# Patient Record
Sex: Female | Born: 1960
Health system: Southern US, Community
[De-identification: ages and names within clinical notes are randomized; demographics above are authoritative.]

## PROBLEM LIST (undated history)

## (undated) DIAGNOSIS — E119 Type 2 diabetes mellitus without complications: Secondary | ICD-10-CM

## (undated) DIAGNOSIS — T8859XA Other complications of anesthesia, initial encounter: Secondary | ICD-10-CM

## (undated) DIAGNOSIS — G709 Myoneural disorder, unspecified: Secondary | ICD-10-CM

## (undated) DIAGNOSIS — T4145XA Adverse effect of unspecified anesthetic, initial encounter: Secondary | ICD-10-CM

## (undated) DIAGNOSIS — I890 Lymphedema, not elsewhere classified: Secondary | ICD-10-CM

## (undated) HISTORY — PX: JOINT REPLACEMENT: SHX530

## (undated) HISTORY — DX: Type 2 diabetes mellitus without complications: E11.9

## (undated) HISTORY — PX: CATARACT EXTRACTION, BILATERAL: SHX1313

## (undated) HISTORY — PX: TUBAL LIGATION: SHX77

---
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
## 2007-10-14 ENCOUNTER — Ambulatory Visit: Payer: Self-pay | Admitting: Anesthesiology

## 2007-11-18 ENCOUNTER — Ambulatory Visit: Payer: Self-pay | Admitting: Anesthesiology

## 2008-01-03 ENCOUNTER — Ambulatory Visit: Payer: Self-pay | Admitting: Anesthesiology

## 2014-03-20 ENCOUNTER — Ambulatory Visit: Payer: Self-pay | Admitting: Internal Medicine

## 2014-03-20 IMAGING — CR DG THORACIC SPINE 2-3V
1 series · 2 of 2 positions shown · non-contrast
Comparison: Chest x-ray of today's date

CLINICAL DATA: One month of right-sided chest pain neck without
reported injury, initial visit

EXAM:
THORACIC SPINE - 2 VIEW

[Series 1: dxr thoracic  ap and lateral · 0.14mm/px · 2 of 2 slices shown]
[im 1/2]
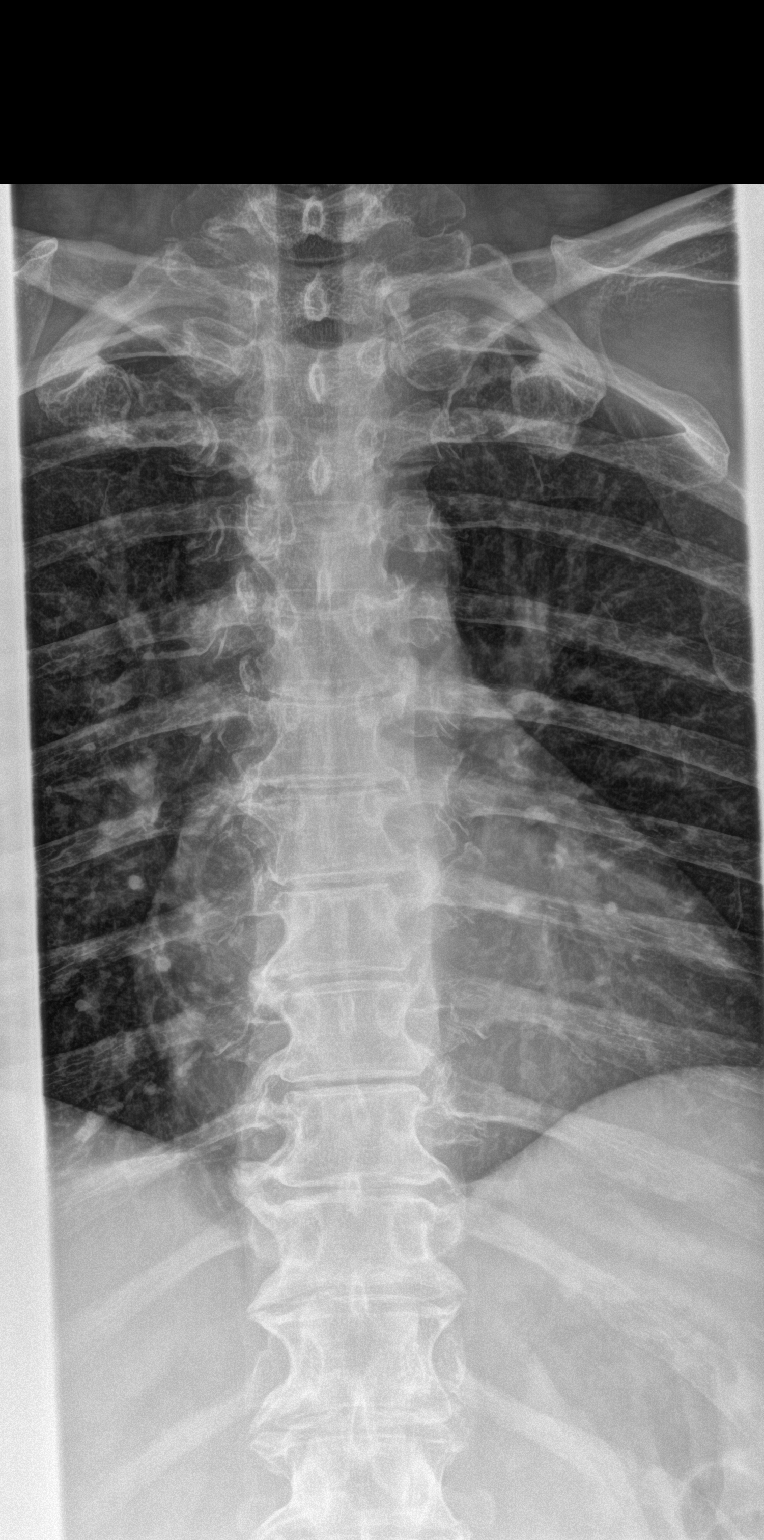
[im 2/2]
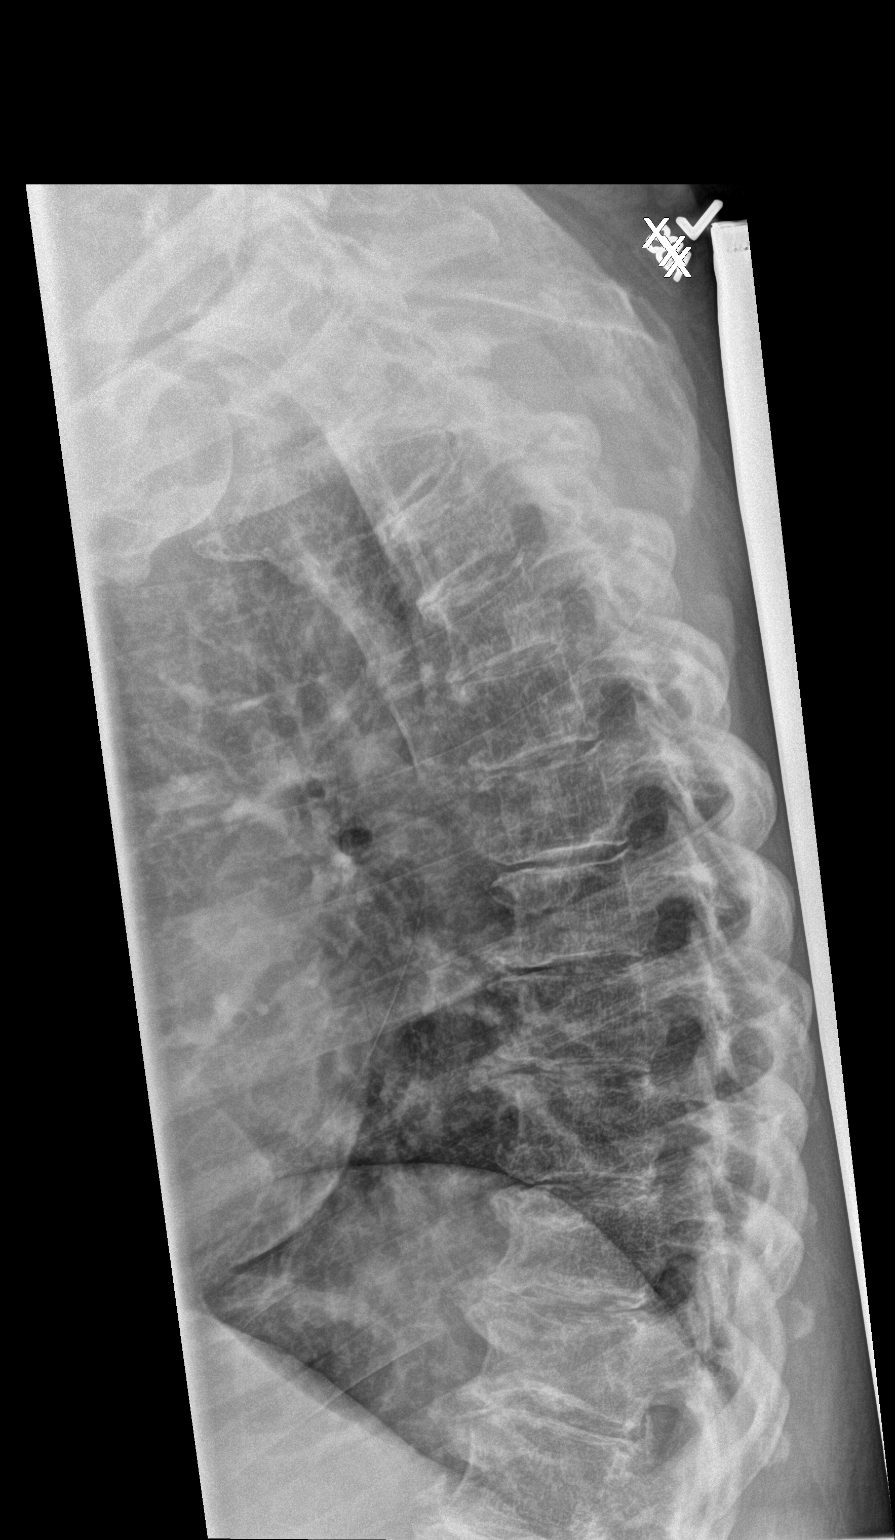

[2 of 2 positions shown; findings below may reference images not displayed]

FINDINGS: The thoracic vertebral bodies are preserved in height. There is mild
multilevel disc space narrowing. There are anterior and
predominantly right lateral osteophytes in the mid and lower
thoracic spine. The pedicles are intact. There are no abnormal
paravertebral soft tissue densities.
IMPRESSION: There is moderate degenerative disc disease of the mid and lower
thoracic spine. There is no compression fracture.

## 2014-03-20 IMAGING — CR DG CHEST 2V
1 series · 2 of 2 positions shown · non-contrast
Comparison: No priors.

CLINICAL DATA: 53-year-old female complaining of right-sided chest
pain for 1 month.

EXAM:
CHEST  2 VIEW

[Series 1: dxr chest pa (or ap) and lateral · 0.14mm/px · 2 of 2 slices shown]
[im 1/2]
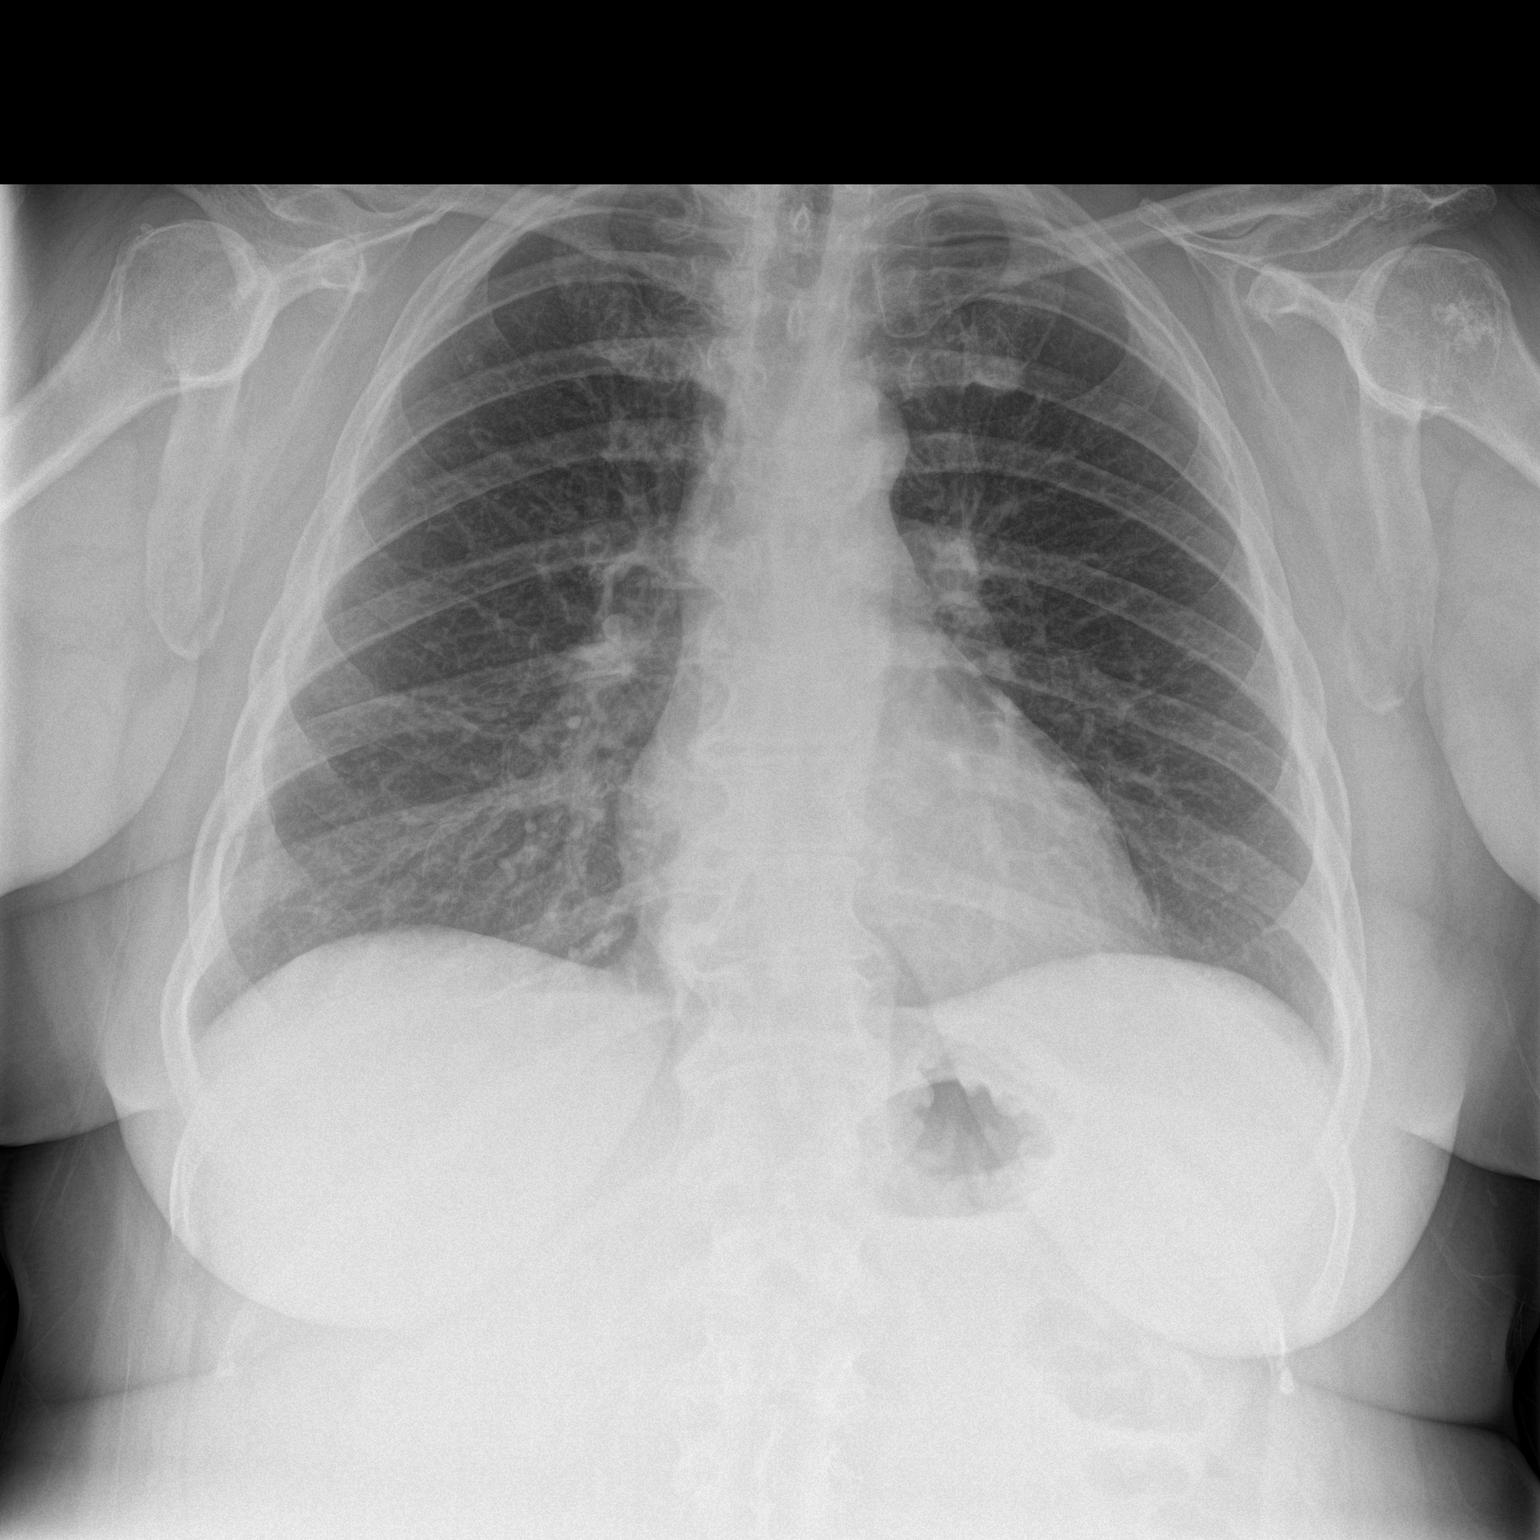
[im 2/2]
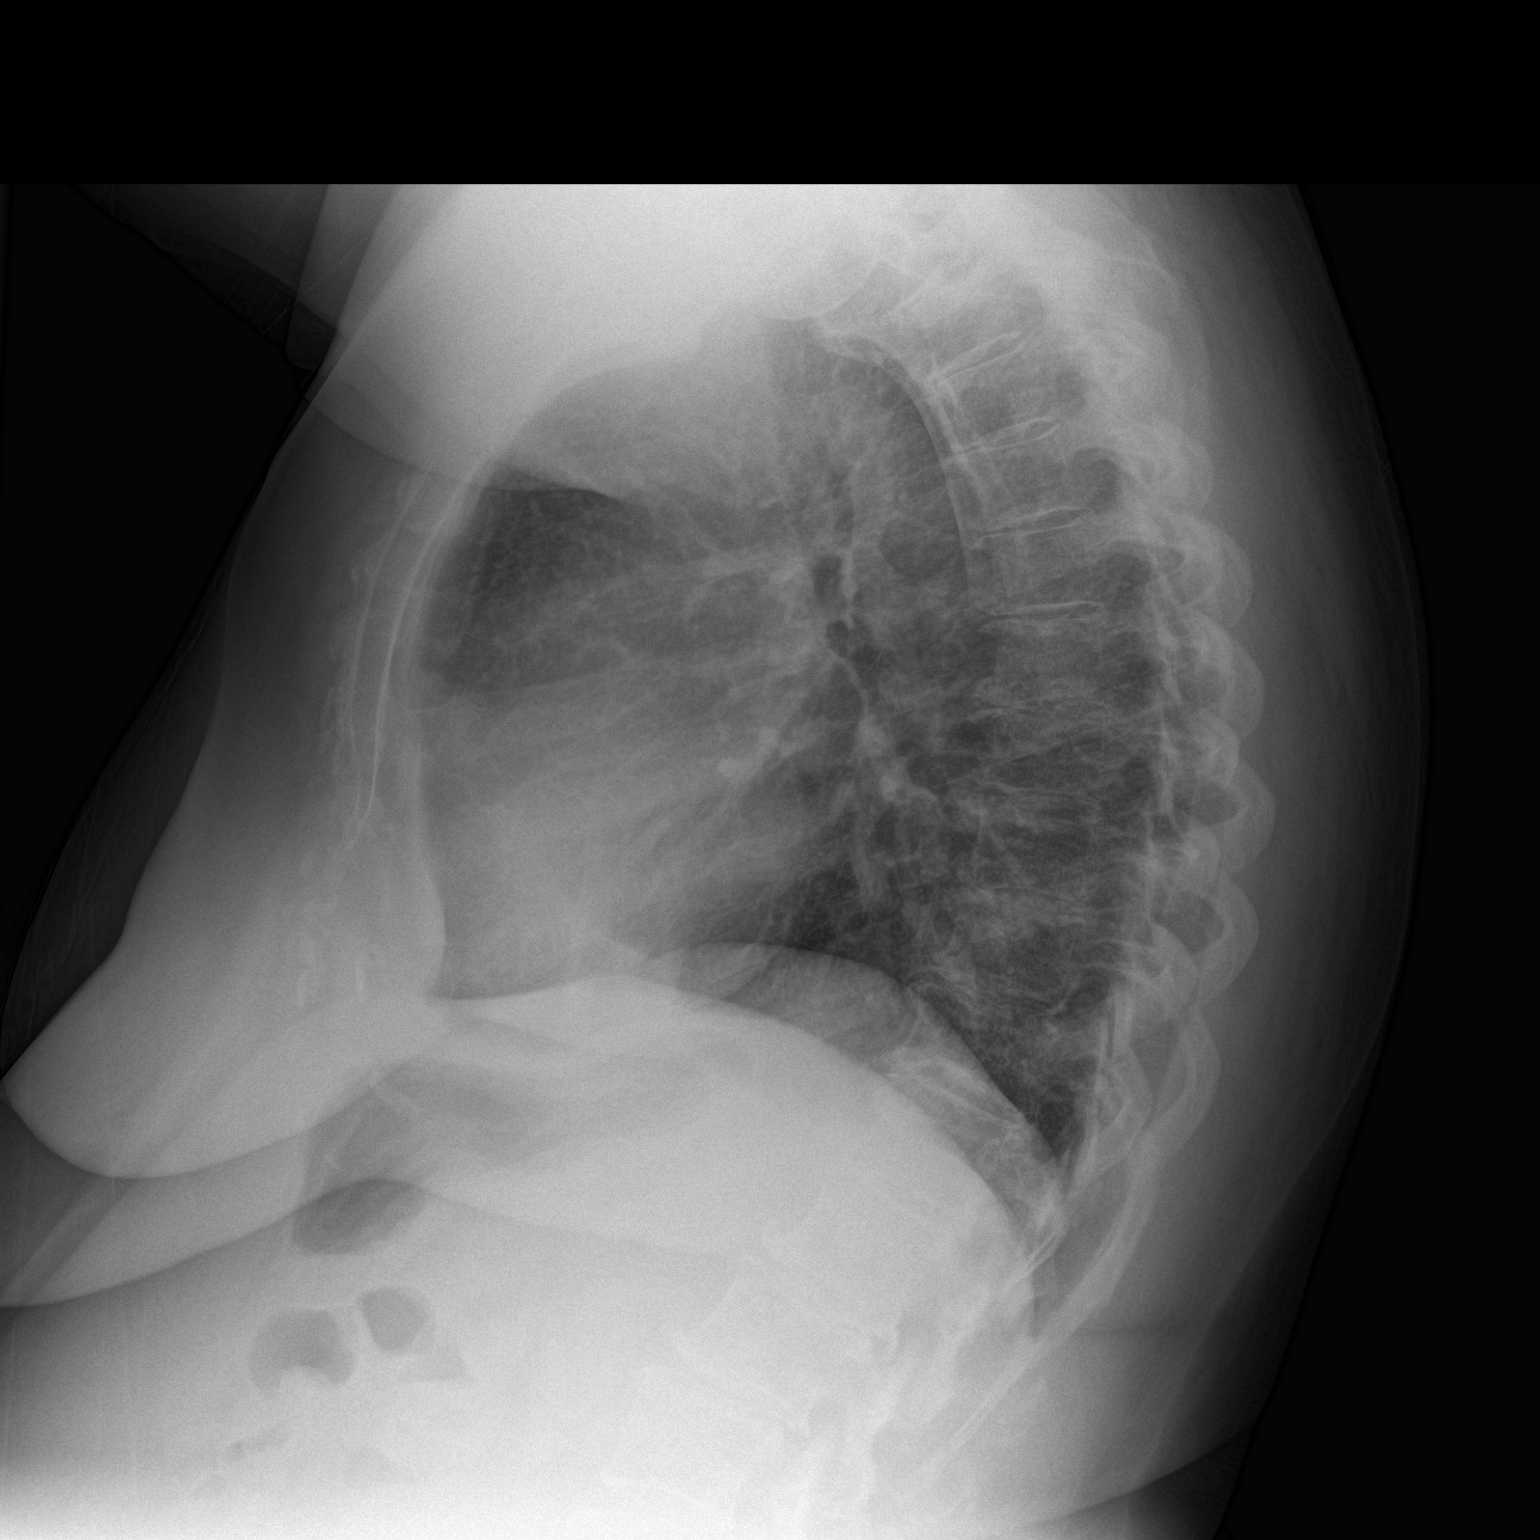

[2 of 2 positions shown; findings below may reference images not displayed]

FINDINGS: Lung volumes are normal. No consolidative airspace disease. No
pleural effusions. No pneumothorax. No pulmonary nodule or mass
noted. Pulmonary vasculature and the cardiomediastinal silhouette
are within normal limits. Atherosclerosis in the thoracic aorta.
Subtle irregularity of the lateral aspect of the right seventh rib
could represent a healed or healing fracture. Sclerotic lesion
measuring approximately 2 cm in diameter in the left humeral head.
IMPRESSION: 1. No radiographic evidence of acute cardiopulmonary disease.
2. Subtle irregularity of the lateral aspect of the right seventh
rib could represent an old healed fracture or a healing fracture. If
this correlates with point tenderness, dedicated right-sided rib
series would be recommended to evaluate for a healing fracture.
3. Atherosclerosis.
4. Sclerotic lesion in the left humeral head measuring approximately
2 cm in diameter, favored to represent an enchondroma. Dedicated
left shoulder radiographs are recommended to establish a baseline
evaluation for future followup of this lesion.

## 2014-07-02 ENCOUNTER — Encounter: Payer: Self-pay | Admitting: Podiatry

## 2014-07-02 ENCOUNTER — Ambulatory Visit (INDEPENDENT_AMBULATORY_CARE_PROVIDER_SITE_OTHER): Payer: BLUE CROSS/BLUE SHIELD | Admitting: Podiatry

## 2014-07-02 ENCOUNTER — Ambulatory Visit (INDEPENDENT_AMBULATORY_CARE_PROVIDER_SITE_OTHER): Payer: BLUE CROSS/BLUE SHIELD

## 2014-07-02 VITALS — BP 140/75 | HR 93 | Resp 17 | Ht 64.0 in | Wt 223.0 lb

## 2014-07-02 DIAGNOSIS — M79671 Pain in right foot: Secondary | ICD-10-CM

## 2014-07-02 DIAGNOSIS — M722 Plantar fascial fibromatosis: Secondary | ICD-10-CM

## 2014-07-02 MED ORDER — MELOXICAM 15 MG PO TABS: 15.0000 mg | ORAL_TABLET | Freq: Every day | ORAL | Status: DC

## 2014-07-02 MED ORDER — METHYLPREDNISOLONE 4 MG PO TBPK: ORAL_TABLET | ORAL | Status: DC

## 2014-07-02 NOTE — Progress Notes (Signed)
   Subjective:    Patient ID: Courtney Henderson, female    DOB: 1960-02-28, 54 y.o.   MRN: [id number] Pt comes in today with both heels hurting 6 sixs weeks , she soaking  her her feet, iced, changed her insoles, tried elevation and now she is here.  By the end of the day her her heels are swollen and hurting , to where it hurts to walk. Pt is diabetic  Last A1c was 9-10 months ago , 150 is her avg.  HPI    Review of Systems  Constitutional: Positive for fatigue and unexpected weight change.       Night sweats  W/ explantion   HENT:       Glaucoma Major tooth loss   Respiratory:       Smoker   Cardiovascular:       Poor circulation  Numbness Stiffness, loss of feeling   Endocrine:       Diabetic   Musculoskeletal:       Muscle pain or cramps  sciatica issues now   All other systems reviewed and are negative.      Objective:   Physical Exam: I have reviewed her past medical history medications allergy surgery social history and review of systems. Pulses are strongly palpable bilateral. Neurologic sensorium is intact per Semmes-Weinstein monofilament. Deep tendon reflexes intact bilateral and muscle strength +5 over 5 dorsiflexion plantar flexors and inverters and everters all of his musculature is intact. Orthopedic evaluation of his face pain on palpation medial calcaneal tubercles bilateral. Radiographs do demonstrate soft tissue increase in density plantar fascial calcaneal insertion sites bilateral with with heel spurs present.        Assessment & Plan:  Assessment: Plantar fasciitis bilateral.  Plan: Start her on a Medrol Dosepak encouraged her to watch her sugars very closely. I also started her on meloxicam after the Medrol. I injected the bilateral heels today. Her plantar fascial braces and a night splint. We discussed appropriate shoe gear stretching exercises ice therapy. We discussed the etiology pathology conservative versus surgical therapies. I'll follow-up with her  in 1 month

## 2014-08-08 ENCOUNTER — Ambulatory Visit: Payer: Self-pay | Admitting: Podiatry

## 2014-08-22 ENCOUNTER — Ambulatory Visit (INDEPENDENT_AMBULATORY_CARE_PROVIDER_SITE_OTHER): Payer: BLUE CROSS/BLUE SHIELD | Admitting: Podiatry

## 2014-08-22 DIAGNOSIS — M722 Plantar fascial fibromatosis: Secondary | ICD-10-CM

## 2014-08-22 NOTE — Progress Notes (Signed)
She presents today and states that her heels are doing much better. She states that she is approximately 90% improved. She states that she continues to smoke on a regular basis and has not decreased at all she also states that she does not wear her plantar fascial braces or night splint. She is not taking any medications.  Objective: Vital signs are stable she is alert and oriented 3 she has pain on palpation medial calcaneal tubercle bilateral. Pulses remain palpable and pain.  Assessment: Plantar fasciitis bilateral.  Plan: Injected the plantar bilateral fascia today with Kenalog and local and aesthetic. Encouraged CONSERVATIVE therapies and will follow up with her in 1 month at which time we may need to consider orthotics.

## 2014-09-19 ENCOUNTER — Ambulatory Visit: Payer: BLUE CROSS/BLUE SHIELD | Admitting: Podiatry

## 2014-10-08 ENCOUNTER — Encounter: Payer: Self-pay | Admitting: Podiatry

## 2014-10-08 ENCOUNTER — Ambulatory Visit (INDEPENDENT_AMBULATORY_CARE_PROVIDER_SITE_OTHER): Payer: BLUE CROSS/BLUE SHIELD | Admitting: Podiatry

## 2014-10-08 VITALS — BP 140/80 | HR 69 | Resp 12

## 2014-10-08 DIAGNOSIS — M722 Plantar fascial fibromatosis: Secondary | ICD-10-CM

## 2014-10-08 MED ORDER — MELOXICAM 15 MG PO TABS: 15.0000 mg | ORAL_TABLET | Freq: Every day | ORAL | Status: DC

## 2014-10-08 NOTE — Progress Notes (Signed)
She presents today for follow-up of her plantar fasciitis. She states that it comes and goes and seems to get worse by the end of very month. She states that it feels like it is pulling or drawing. She continues her anti-inflammatory's.  Objective: Vital signs are stable she is alert and oriented 3. Pulses are palpable. Neurologic sensorium is intact per Semmes-Weinstein monofilament. Deep tendon reflexes are intact. Muscle strength equal bilateral. She is pain on palpation medial calcaneal tubercles bilateral.  Assessment: Chronic intractable plantar fasciitis biomechanical in nature bilateral.  Plan: Injected the bilateral heels today with Kenalog and local and aesthetic. Started her back on her meloxicam and reordered that. Also requested her to be scanned for set of orthotics. I will follow-up with her once this come in.

## 2014-11-07 ENCOUNTER — Encounter: Payer: Self-pay | Admitting: Podiatry

## 2014-11-07 ENCOUNTER — Ambulatory Visit (INDEPENDENT_AMBULATORY_CARE_PROVIDER_SITE_OTHER): Payer: BLUE CROSS/BLUE SHIELD | Admitting: Podiatry

## 2014-11-07 VITALS — BP 132/77 | HR 93 | Resp 18

## 2014-11-07 DIAGNOSIS — M722 Plantar fascial fibromatosis: Secondary | ICD-10-CM

## 2014-11-07 NOTE — Patient Instructions (Signed)

## 2014-11-07 NOTE — Progress Notes (Signed)
She presents today to pick up her orthotics for her plantar fasciitis. She was given both oral and written home-going instructions for care and use of the orthotics she will follow-up with me in 6 weeks.

## 2014-12-05 ENCOUNTER — Ambulatory Visit: Payer: BLUE CROSS/BLUE SHIELD | Admitting: Podiatry

## 2015-01-09 ENCOUNTER — Encounter: Payer: Self-pay | Admitting: Podiatry

## 2015-01-09 ENCOUNTER — Ambulatory Visit (INDEPENDENT_AMBULATORY_CARE_PROVIDER_SITE_OTHER): Payer: BLUE CROSS/BLUE SHIELD | Admitting: Podiatry

## 2015-01-09 VITALS — BP 169/91 | HR 92 | Resp 18

## 2015-01-09 DIAGNOSIS — M722 Plantar fascial fibromatosis: Secondary | ICD-10-CM

## 2015-01-09 NOTE — Progress Notes (Signed)
She presents today with chief complaint of bilateral heel pain. She would like to have her ingrown toenails cut out however we never received the paperwork from her primary provider with her latest A1c.  Objective: Vital signs are stable she is alert and oriented 3. Pulses are palpable. She has sharp incurvated nail margins to the tibiofibular border of the hallux bilateral. These are painful on palpation. She still has pain on palpation medial calcaneal tubercles bilateral. The nail margins do not appear to be infected.  Assessment: Chronic intractable plantar fasciitis bilateral.  Plan: Reinject the bilateral heels today with prolonged local anesthetic follow-up with her as needed.

## 2015-02-10 ENCOUNTER — Other Ambulatory Visit: Payer: Self-pay | Admitting: Podiatry

## 2015-02-11 ENCOUNTER — Ambulatory Visit: Payer: BLUE CROSS/BLUE SHIELD | Admitting: Podiatry

## 2015-03-13 ENCOUNTER — Encounter: Payer: Self-pay | Admitting: Podiatry

## 2015-03-13 ENCOUNTER — Ambulatory Visit (INDEPENDENT_AMBULATORY_CARE_PROVIDER_SITE_OTHER): Payer: BLUE CROSS/BLUE SHIELD | Admitting: Podiatry

## 2015-03-13 DIAGNOSIS — M722 Plantar fascial fibromatosis: Secondary | ICD-10-CM

## 2015-03-13 NOTE — Progress Notes (Signed)
She presents today for follow-up of plantar fasciitis. She's remarks that they are still tender.  Objective: Vital signs are stable she is alert and oriented 3. Pulses are palpable. She has pain on palpation medially located tubercles bilateral heels.  Assessment: Plantar fasciitis bilateral.  Plan: Injected the bilateral heels that with Kenalog and local anesthetic. We'll follow up with me as needed.

## 2015-05-14 IMAGING — US US EXTREM LOW VENOUS BILAT
1 series · 13 of 24 positions shown · non-contrast
Comparison: None.

CLINICAL DATA: Bilateral lower extremity swelling for the past 3
months. Evaluate for DVT



[Series 1: us extrem low venous bilat · 0.08mm/px · 13 of 61 slices shown]
[im 1/61]
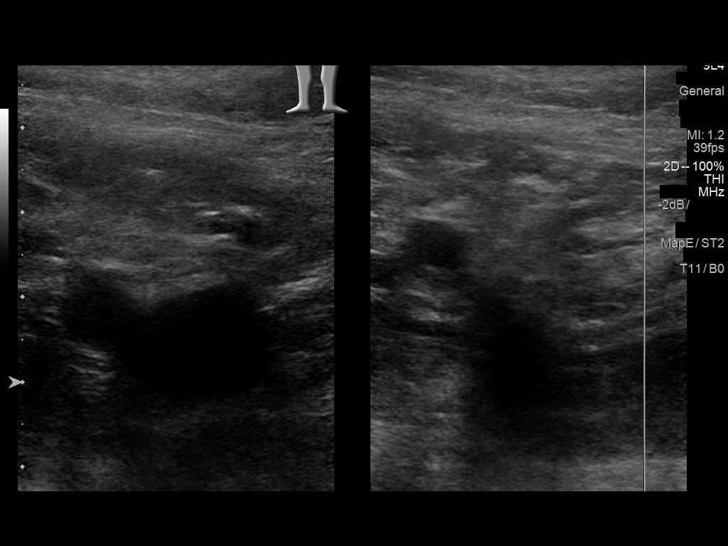
[im 6/61]
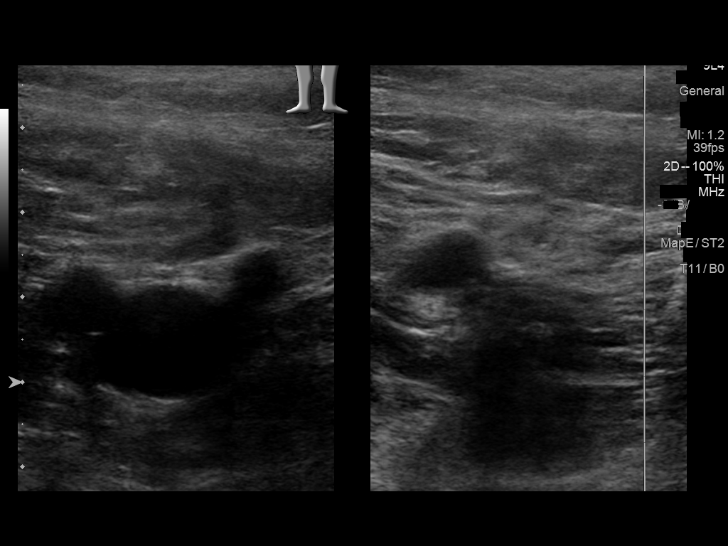
[im 11/61]
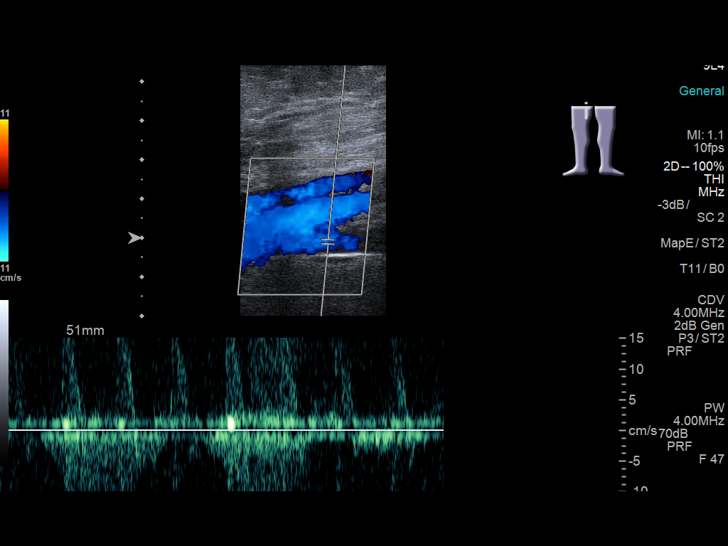
[im 16/61]
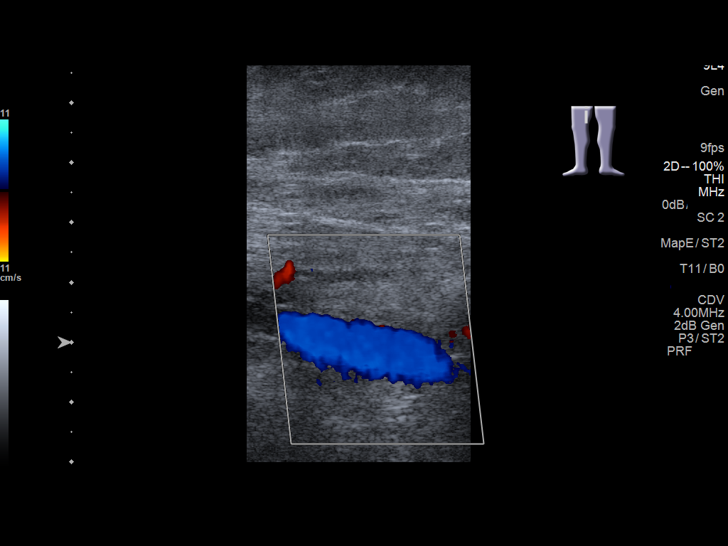
[im 21/61]
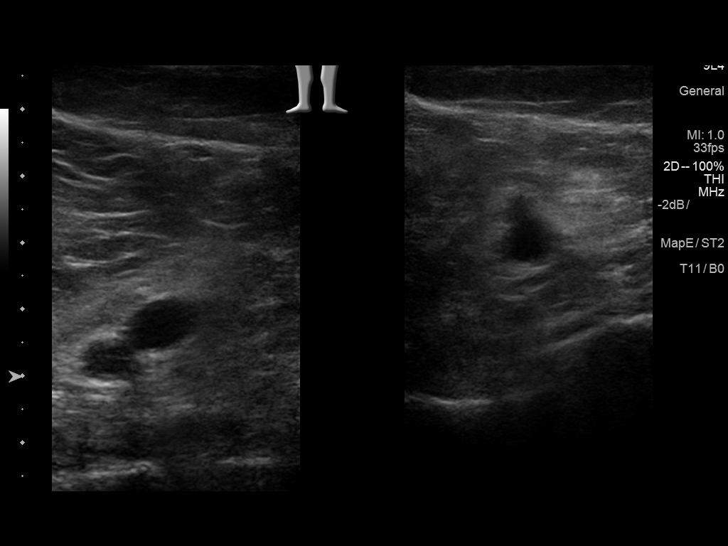
[im 27/61]
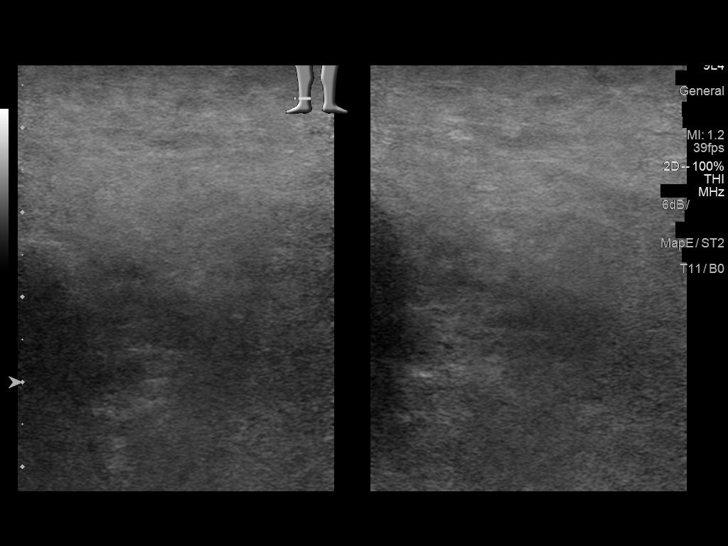
[im 32/61]
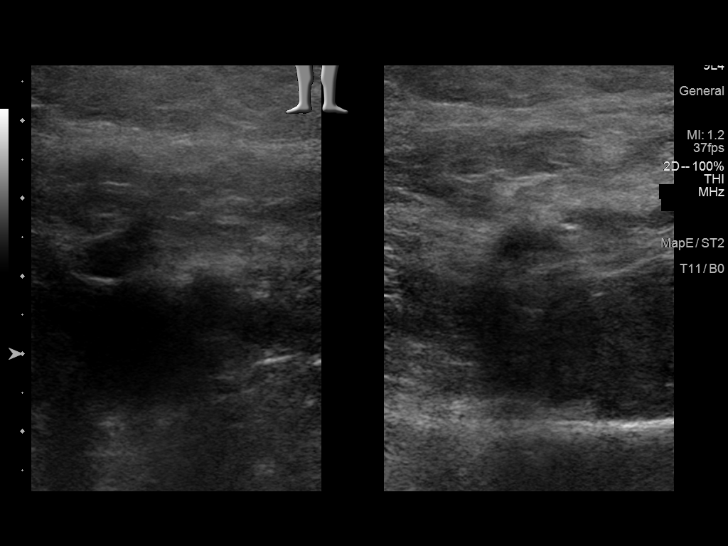
[im 34/61]
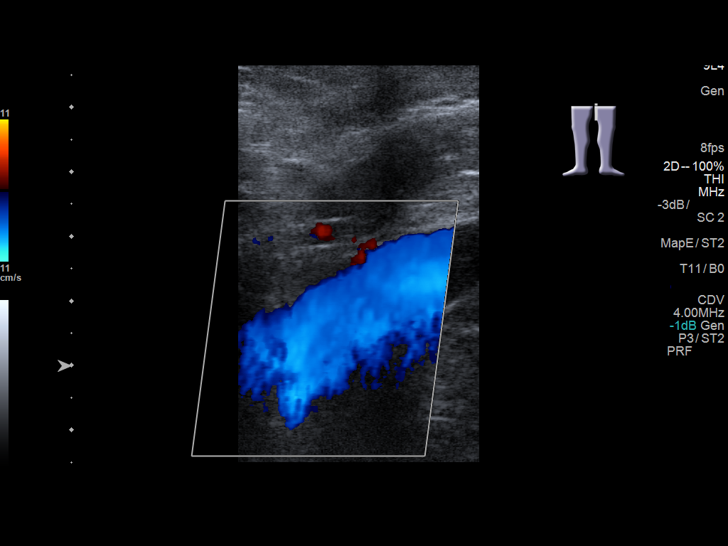
[im 40/61]
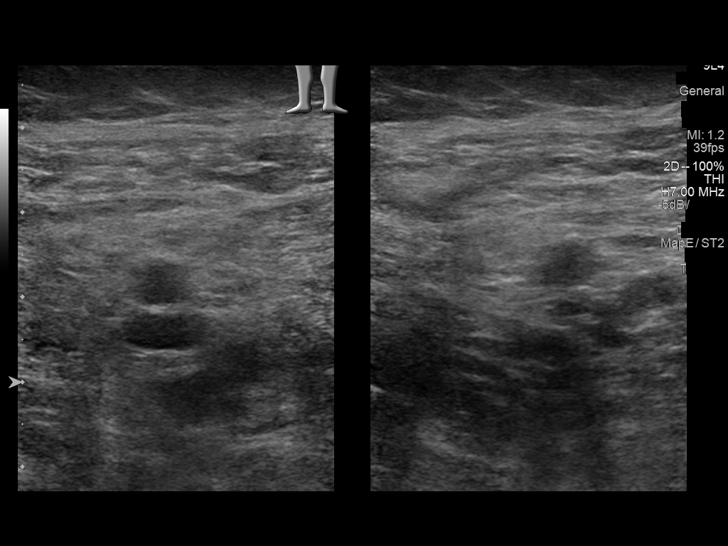
[im 45/61]
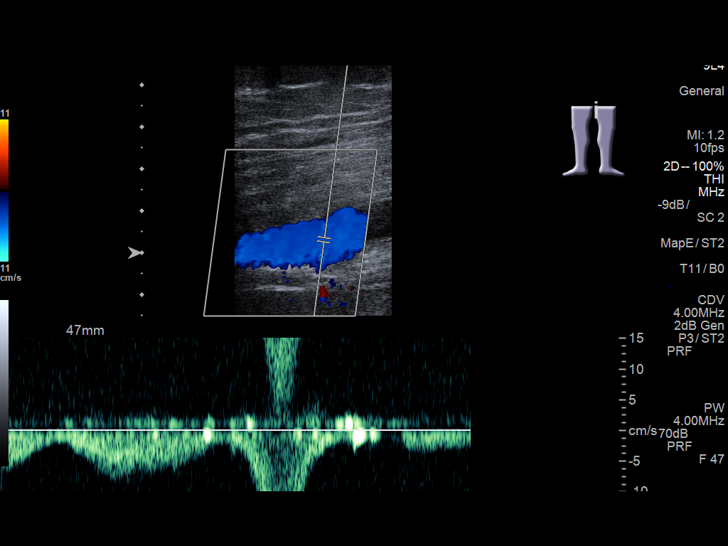
[im 50/61]
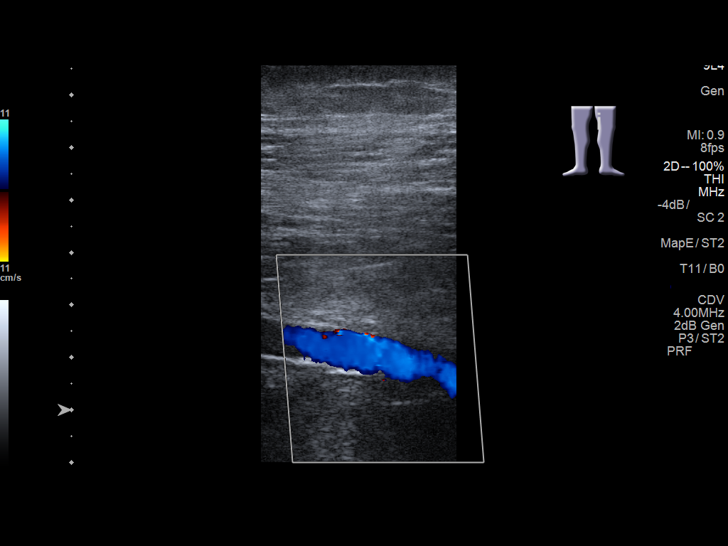
[im 55/61]
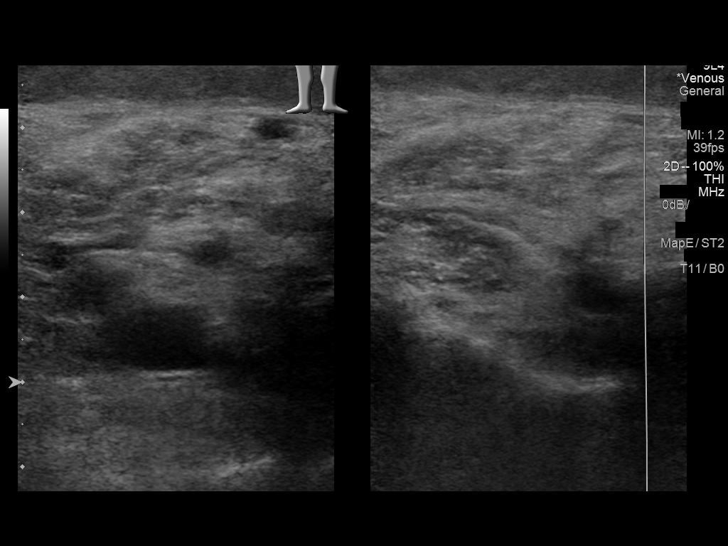
[im 61/61]
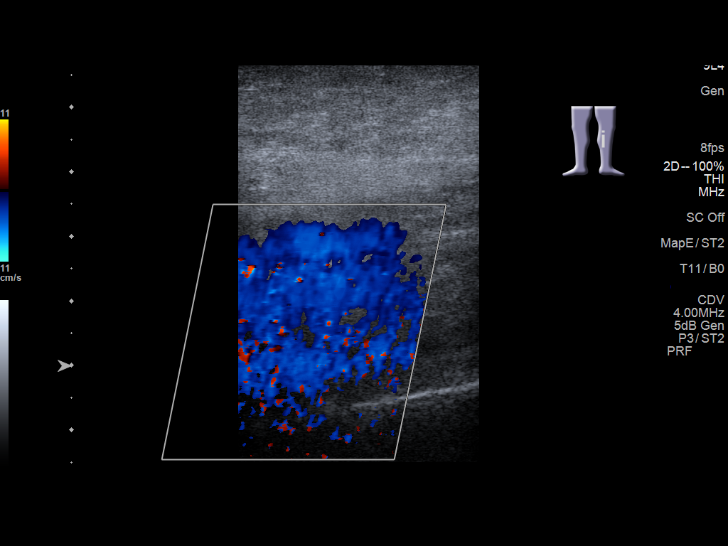

[13 of 24 positions shown; findings below may reference images not displayed]

FINDINGS: RIGHT LOWER EXTREMITY

Common Femoral Vein: No evidence of thrombus. Normal
compressibility, respiratory phasicity and response to augmentation.

Saphenofemoral Junction: No evidence of thrombus. Normal
compressibility and flow on color Doppler imaging.

Profunda Femoral Vein: No evidence of thrombus. Normal
compressibility and flow on color Doppler imaging.

Femoral Vein: No evidence of thrombus. Normal compressibility,
respiratory phasicity and response to augmentation.

Popliteal Vein: No evidence of thrombus. Normal compressibility,
respiratory phasicity and response to augmentation.

Calf Veins: No evidence of thrombus. Normal compressibility and flow
on color Doppler imaging.

Superficial Great Saphenous Vein: No evidence of thrombus. Normal
compressibility and flow on color Doppler imaging.

Venous Reflux:  None.

Other Findings:  None.

LEFT LOWER EXTREMITY

Common Femoral Vein: No evidence of thrombus. Normal
compressibility, respiratory phasicity and response to augmentation.

Saphenofemoral Junction: No evidence of thrombus. Normal
compressibility and flow on color Doppler imaging.

Profunda Femoral Vein: No evidence of thrombus. Normal
compressibility and flow on color Doppler imaging.

Femoral Vein: No evidence of thrombus. Normal compressibility,
respiratory phasicity and response to augmentation.

Popliteal Vein: No evidence of thrombus. Normal compressibility,
respiratory phasicity and response to augmentation.

Calf Veins: No evidence of thrombus. Normal compressibility and flow
on color Doppler imaging.

Superficial Great Saphenous Vein: No evidence of thrombus. Normal
compressibility and flow on color Doppler imaging.

Venous Reflux:  None.

Other Findings:  None.
IMPRESSION: No evidence of DVT within either lower extremity.

## 2015-06-03 ENCOUNTER — Telehealth: Payer: Self-pay | Admitting: *Deleted

## 2015-06-03 MED ORDER — MELOXICAM 15 MG PO TABS: 15.0000 mg | ORAL_TABLET | Freq: Every day | ORAL | Status: DC

## 2015-06-03 NOTE — Telephone Encounter (Signed)
Fax request for 90 day supply Meloxicam 15mg .  Dr. Maren BeachHyatt okayed +2 refills.  Return faxed.

## 2015-06-12 ENCOUNTER — Other Ambulatory Visit: Payer: Self-pay | Admitting: Internal Medicine

## 2015-06-12 DIAGNOSIS — M7989 Other specified soft tissue disorders: Secondary | ICD-10-CM

## 2015-06-13 ENCOUNTER — Ambulatory Visit: Payer: Self-pay

## 2015-06-24 ENCOUNTER — Ambulatory Visit: Payer: Self-pay | Admitting: General Surgery

## 2015-07-16 ENCOUNTER — Ambulatory Visit: Payer: BLUE CROSS/BLUE SHIELD

## 2015-07-16 ENCOUNTER — Ambulatory Visit
Admission: RE | Admit: 2015-07-16 | Discharge: 2015-07-16 | Disposition: A | Discharge status: Home / Self Care | Payer: BLUE CROSS/BLUE SHIELD | Source: Ambulatory Visit | Attending: Internal Medicine | Admitting: Internal Medicine

## 2015-07-16 DIAGNOSIS — M7989 Other specified soft tissue disorders: Secondary | ICD-10-CM | POA: Diagnosis not present

## 2015-08-05 ENCOUNTER — Ambulatory Visit (INDEPENDENT_AMBULATORY_CARE_PROVIDER_SITE_OTHER): Payer: BLUE CROSS/BLUE SHIELD | Admitting: Podiatry

## 2015-08-05 ENCOUNTER — Encounter: Payer: Self-pay | Admitting: Podiatry

## 2015-08-05 DIAGNOSIS — M722 Plantar fascial fibromatosis: Secondary | ICD-10-CM | POA: Diagnosis not present

## 2015-08-05 NOTE — Progress Notes (Signed)
She presents today with bilateral heel pain that she can no longer take her meloxicam because of the primary care provider suggested that she not take it any longer due to swelling.  Objective: Vital signs are stable alert and oriented 3. Pulses are palpable. Neurologic sensorium is intact. Deep tendon reflexes are intact. She has pain on palpation medially continued tubercles bilateral.  Assessment: Pain in limb secondary to plantar fasciitis bilateral.  Plan: Injected the bilateral heels today with Kenalog and local anesthetic.

## 2015-10-30 ENCOUNTER — Ambulatory Visit (INDEPENDENT_AMBULATORY_CARE_PROVIDER_SITE_OTHER): Payer: BLUE CROSS/BLUE SHIELD | Admitting: Podiatry

## 2015-10-30 ENCOUNTER — Encounter: Payer: Self-pay | Admitting: Podiatry

## 2015-10-30 DIAGNOSIS — L600 Ingrowing nail: Secondary | ICD-10-CM | POA: Diagnosis not present

## 2015-10-30 DIAGNOSIS — M722 Plantar fascial fibromatosis: Secondary | ICD-10-CM

## 2015-10-30 MED ORDER — NEOMYCIN-POLYMYXIN-HC 1 % OT SOLN: OTIC | 1 refills | Status: DC

## 2015-10-30 NOTE — Progress Notes (Signed)
She presents today chief complaint of bilateral heel pain as well as sharp incurvated nails.  Objective: Vital signs are stable she is alert and oriented 3 pulses are strongly palpable. Neurologic sensorium is intact. Degenerative flexor intact. His pain on palpation medial trochanter was bilateral here. She has sharp and radial dorsal incision fibular borders of the hallux bilateral. States that she is unable to have meniscectomies performed secondary to her blood sugar and the fact that she has activities to perform this weekend. If she does state her blood sugars currently running a 7.2.  Assessment: Is diabetes mellitus with plantar fasciitis bilateral. Ingrown nails hallux bilateral.  Plan:injected the bilateral heels today with Kenalog and local anesthetic deferred matrixectomy's until a later date. Instructed her to continue on conservative therapy stretching her size and ice therapy.

## 2016-01-08 ENCOUNTER — Ambulatory Visit (INDEPENDENT_AMBULATORY_CARE_PROVIDER_SITE_OTHER): Payer: BLUE CROSS/BLUE SHIELD | Admitting: Podiatry

## 2016-01-08 DIAGNOSIS — M722 Plantar fascial fibromatosis: Secondary | ICD-10-CM

## 2016-01-08 NOTE — Progress Notes (Signed)
She presents today with chief complaint of painful heels bilateral. She states that she needs another injection.  Objective: Vital signs are stable she's alert and oriented 3. Pulses are palpable. She has pain on palpation plantar fascial calcaneal insertion site bilaterally.  Assessment: Patient and limb secondary to plantar fasciitis. Bilateral foot.  Plan: Injected the bilateral heels today with a long local anesthetic. Follow-up with her on an as-needed basis.

## 2016-04-13 ENCOUNTER — Encounter: Payer: Self-pay | Admitting: Podiatry

## 2016-04-13 ENCOUNTER — Ambulatory Visit (INDEPENDENT_AMBULATORY_CARE_PROVIDER_SITE_OTHER): Payer: BLUE CROSS/BLUE SHIELD | Admitting: Podiatry

## 2016-04-13 DIAGNOSIS — M722 Plantar fascial fibromatosis: Secondary | ICD-10-CM | POA: Diagnosis not present

## 2016-04-13 NOTE — Progress Notes (Signed)
She presents today chief complaint of painful bilateral heels. She states that the injection has been working well until just recently.  Objective: Vital signs are stable she is alert and oriented 3. Pulses are palpable. Neurologic sensorium is intact. Deep tendon reflexes are intact. Muscle strength was 5 over 5 dorsiflexion plantar flexors and inverters everters AS well as muscular sutures intact. She is pain on palpation medial calcaneal tubercles bilateral.  Assessment: Continue plantar fasciitis bilateral.  Plan: Reinjected bilateral heels today with Kenalog and local anesthetic continue all other conservative therapies follow up with her in 1 month.

## 2016-07-08 ENCOUNTER — Ambulatory Visit (INDEPENDENT_AMBULATORY_CARE_PROVIDER_SITE_OTHER): Payer: BLUE CROSS/BLUE SHIELD | Admitting: Podiatry

## 2016-07-08 DIAGNOSIS — M722 Plantar fascial fibromatosis: Secondary | ICD-10-CM

## 2016-07-08 NOTE — Progress Notes (Signed)
She presents today complaining of bilateral plantar fasciitis. She states that they got better for a while but the pain came back. She is requesting injections again today.  Objective: Pulses are palpable. She has pain upon palpation medial calcaneal tubercles bilateral.  Assessment: Pain limb secondary to plantar fasciitis bilateral.  Plan: Injected the bilateral heels today plantar fascia medial aspect of the foot. Follow up with her on an as-needed basis.

## 2016-08-26 ENCOUNTER — Encounter: Payer: Self-pay | Admitting: Podiatry

## 2016-08-26 ENCOUNTER — Ambulatory Visit (INDEPENDENT_AMBULATORY_CARE_PROVIDER_SITE_OTHER): Payer: BLUE CROSS/BLUE SHIELD | Admitting: Podiatry

## 2016-08-26 DIAGNOSIS — M722 Plantar fascial fibromatosis: Secondary | ICD-10-CM

## 2016-08-26 NOTE — Progress Notes (Signed)
She presents today complaining of bilateral heel pain since she's going to the beach next week and let to consider her injections.  Objective: Vital signs are stable she is alert and oriented 3 she still has tenderness on palpation medial trochanter pupils bilateral.  Assessment: Chronic intractable pain syndrome and plantar fasciitis bilateral.  Plan: We injected the bilateral heels with light dose of Kenalog and local anesthetic mixed with dexamethasone.  I will follow-up with her in 2 months for evaluation fracture her going to [place].

## 2016-11-09 ENCOUNTER — Encounter: Payer: Self-pay | Admitting: Podiatry

## 2016-11-09 ENCOUNTER — Ambulatory Visit (INDEPENDENT_AMBULATORY_CARE_PROVIDER_SITE_OTHER): Payer: BLUE CROSS/BLUE SHIELD | Admitting: Podiatry

## 2016-11-09 DIAGNOSIS — M722 Plantar fascial fibromatosis: Secondary | ICD-10-CM

## 2016-11-10 NOTE — Progress Notes (Signed)
She presents today states my heels are starting to bother me again.  Objective: Vital signs are stable alert and oriented 3. Pulses are palpable. She has pain on palpation medial calcaneal tubercles bilateral. She has no calf pain.  Assessment: Chronic intractable plantar fasciitis bilateral.  Plan: Discussed etiology pathology conservative versus surgical therapies. Highly recommended surgical intervention. Reinjected the bilateral heels today

## 2017-01-06 ENCOUNTER — Encounter: Payer: Self-pay | Admitting: Podiatry

## 2017-01-06 ENCOUNTER — Ambulatory Visit: Payer: BLUE CROSS/BLUE SHIELD | Admitting: Podiatry

## 2017-01-06 DIAGNOSIS — M722 Plantar fascial fibromatosis: Secondary | ICD-10-CM

## 2017-01-06 NOTE — Progress Notes (Signed)
She presents today chief complaint of plantar fasciitis bilateral.  She states that she would like to have another set of injections states that they seem to be doing much better with the injections would like to continue to progress.  Objective: Vital signs are stable she is alert and oriented x3.  Pulses are palpable.  There is no erythema edema cellulitis drainage or odor.  She has pain on palpation medial calcaneal tubercles bilateral.  Assessment: Plantar fasciitis bilateral.  Plan: Discussed etiology pathology conservative versus surgical therapies.  At this point after sterile Betadine skin prep and verbal confirmation we injected to the point of maximal tenderness at the plantar fascial calcaneal insertion site with 20 mg of Kenalog and 5 mg of Marcaine to each heel.  She tolerated procedure well without complications will follow up with me on that in the near future.

## 2017-02-24 ENCOUNTER — Ambulatory Visit: Payer: BLUE CROSS/BLUE SHIELD | Admitting: Podiatry

## 2017-02-24 DIAGNOSIS — M722 Plantar fascial fibromatosis: Secondary | ICD-10-CM

## 2017-02-24 NOTE — Progress Notes (Signed)
She presents today chief complaint of pain to the bilateral heels.  She states that the last injection did not work but may be for a day.  Objective: Vital signs are stable alert and oriented x3.  Pulses are palpable.  Neurologic sensorium is intact.  Degenerative flexors are intact she has pain on palpation medial calcaneal tubercles bilateral.  No skin breakdown no erythema cellulitis drainage or odor.  Assessment: Chronic intractable plantar fasciitis bilateral.  Plan: I injected her bilateral heels today with Kenalog and local anesthetic after sterile Betadine skin prep 20 mg of Kenalog and 5 mg of Marcaine to the point of maximal tenderness medial aspect of the bilateral heels.  She tolerated procedure well without complications.  She will continue conservative therapies and follow-up with her as needed.

## 2017-06-07 ENCOUNTER — Ambulatory Visit
Admission: RE | Admit: 2017-06-07 | Discharge: 2017-06-07 | Disposition: A | Discharge status: Home / Self Care | Payer: BLUE CROSS/BLUE SHIELD | Source: Ambulatory Visit | Attending: Internal Medicine | Admitting: Internal Medicine

## 2017-06-07 ENCOUNTER — Other Ambulatory Visit: Payer: Self-pay | Admitting: Internal Medicine

## 2017-06-07 DIAGNOSIS — M5136 Other intervertebral disc degeneration, lumbar region: Secondary | ICD-10-CM | POA: Insufficient documentation

## 2017-06-07 DIAGNOSIS — M25552 Pain in left hip: Secondary | ICD-10-CM | POA: Insufficient documentation

## 2017-06-07 DIAGNOSIS — Z96642 Presence of left artificial hip joint: Secondary | ICD-10-CM | POA: Diagnosis not present

## 2017-06-07 DIAGNOSIS — M5442 Lumbago with sciatica, left side: Secondary | ICD-10-CM | POA: Diagnosis not present

## 2017-06-07 DIAGNOSIS — M25551 Pain in right hip: Secondary | ICD-10-CM | POA: Insufficient documentation

## 2017-06-07 DIAGNOSIS — M5441 Lumbago with sciatica, right side: Secondary | ICD-10-CM

## 2017-06-07 DIAGNOSIS — M1611 Unilateral primary osteoarthritis, right hip: Secondary | ICD-10-CM | POA: Insufficient documentation

## 2017-06-07 IMAGING — CR DG HIP (WITH OR WITHOUT PELVIS) 2-3V*R*
1 series · 3 of 3 positions shown · non-contrast
Comparison: None.

CLINICAL DATA: Right hip pain

EXAM:
DG HIP (WITH OR WITHOUT PELVIS) 2-3V RIGHT

[Series 1: dg hip unilat w or w/o pelvis 2-3 views  · non-contrast · 0.14mm/px · 3 of 3 slices shown]
[im 1/3]
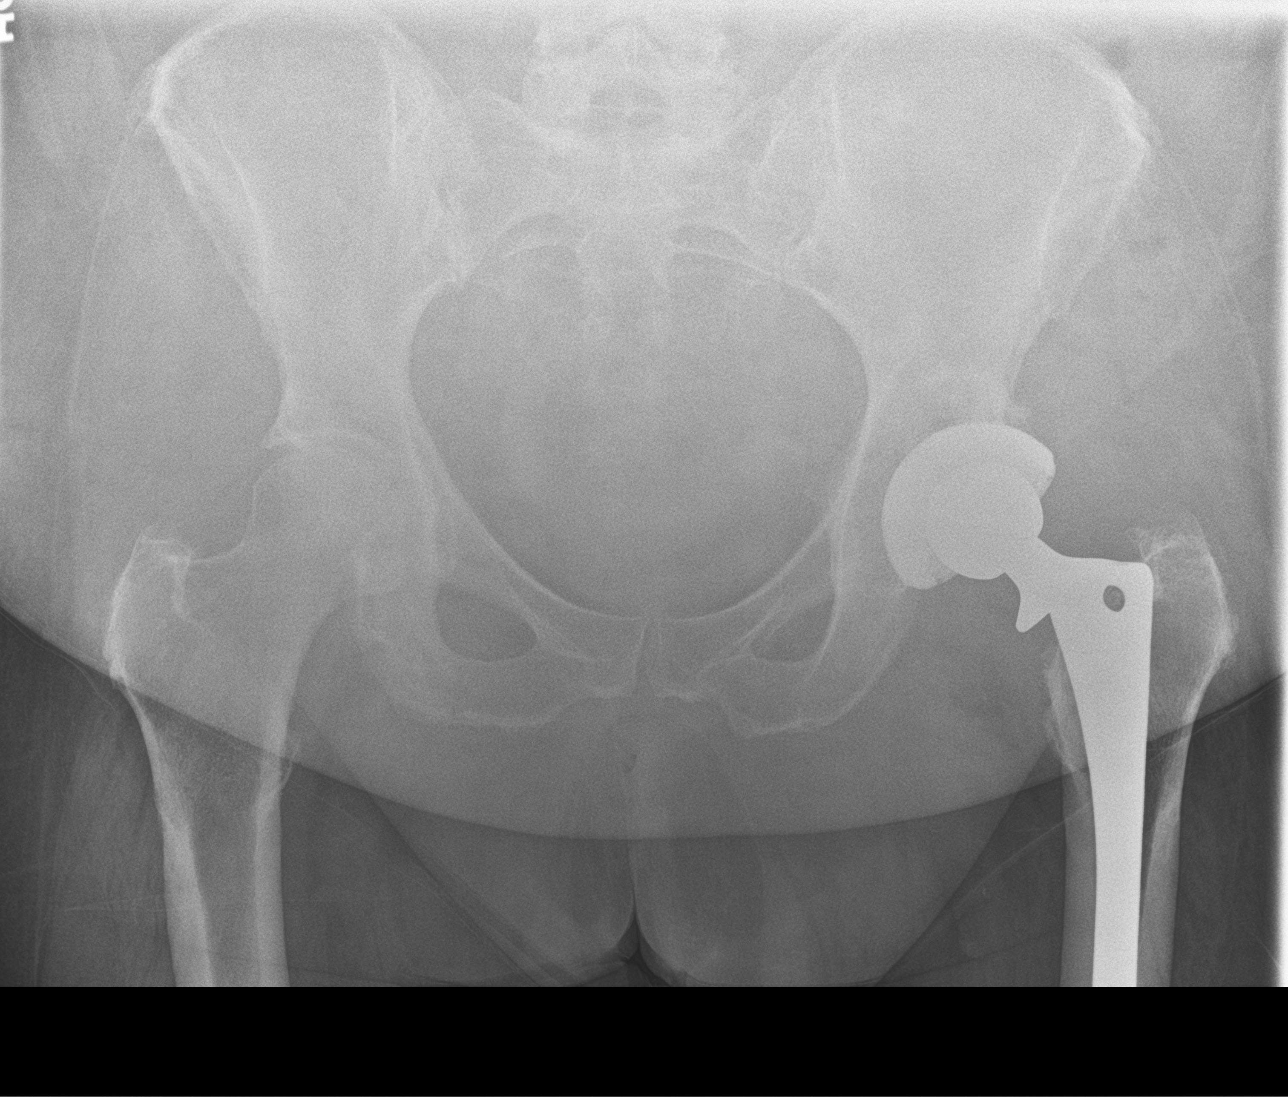
[im 2/3]
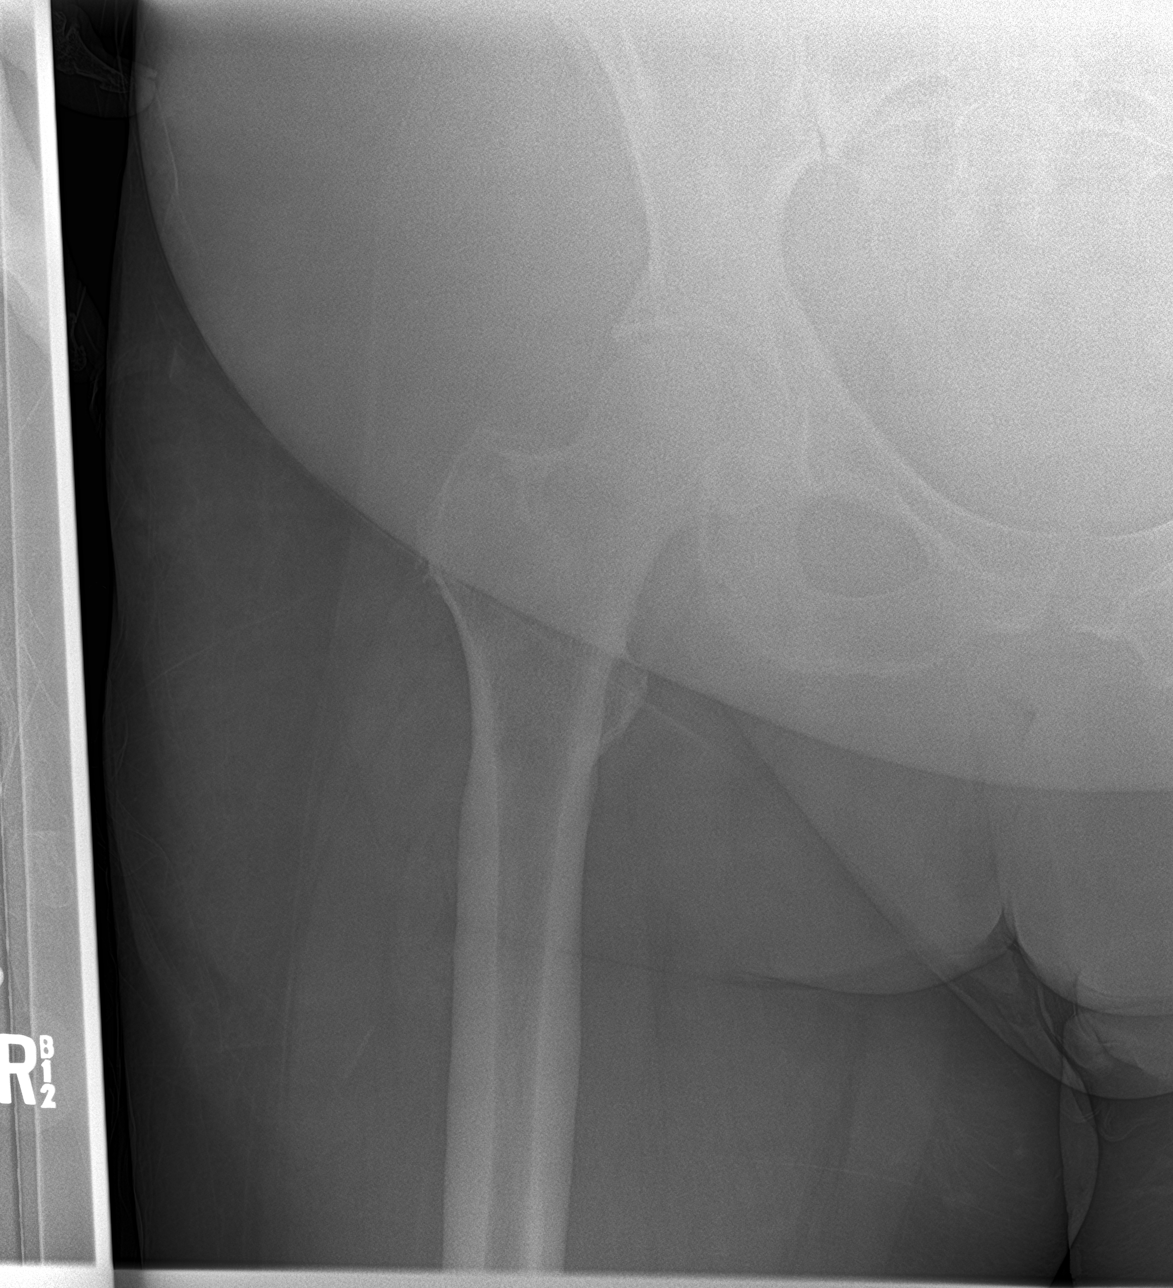
[im 3/3]
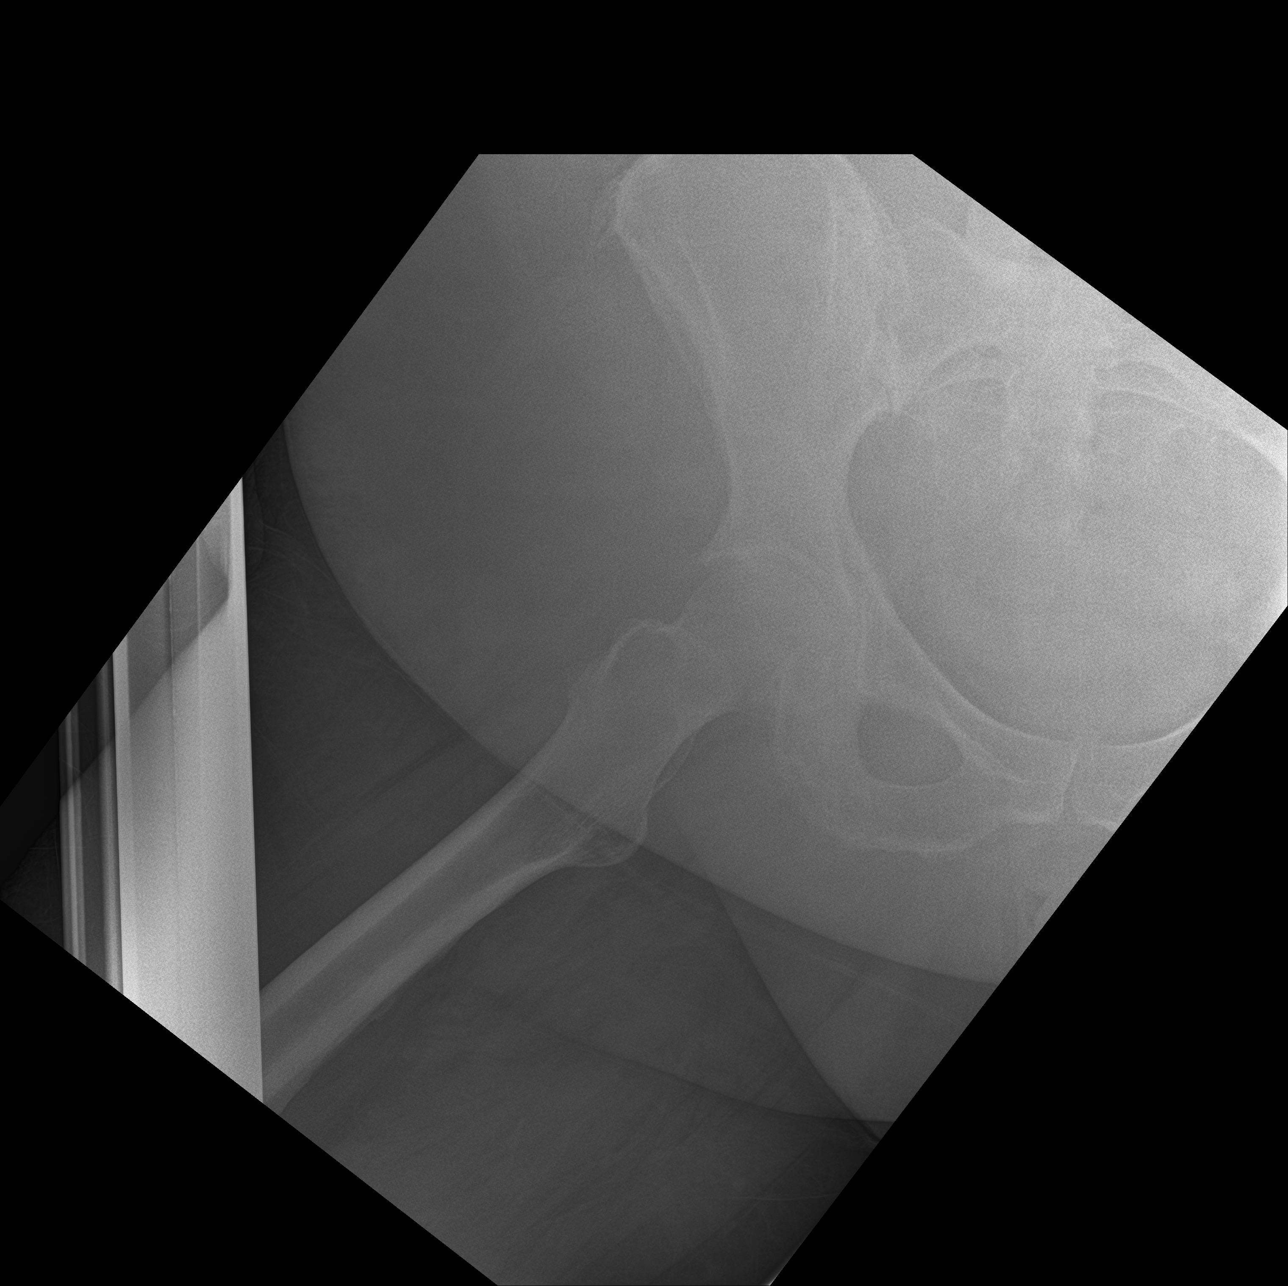

[3 of 3 positions shown; findings below may reference images not displayed]

FINDINGS: Mild joint space narrowing and spurring in the right hip. No
fracture or AVN. No bony lesion
IMPRESSION: Mild osteoarthritis right hip.

## 2017-06-07 IMAGING — CR DG LUMBAR SPINE COMPLETE 4+V
1 series · 5 of 5 positions shown · non-contrast
Comparison: None.

CLINICAL DATA: 57-year-old with low back pain radiating to both
hips and legs. Pain for 6 weeks.

EXAM:
LUMBAR SPINE - COMPLETE 4+ VIEW

[Series 1: dg lumbar spine complete 4 +v · 0.14mm/px · 5 of 5 slices shown]
[im 1/5]
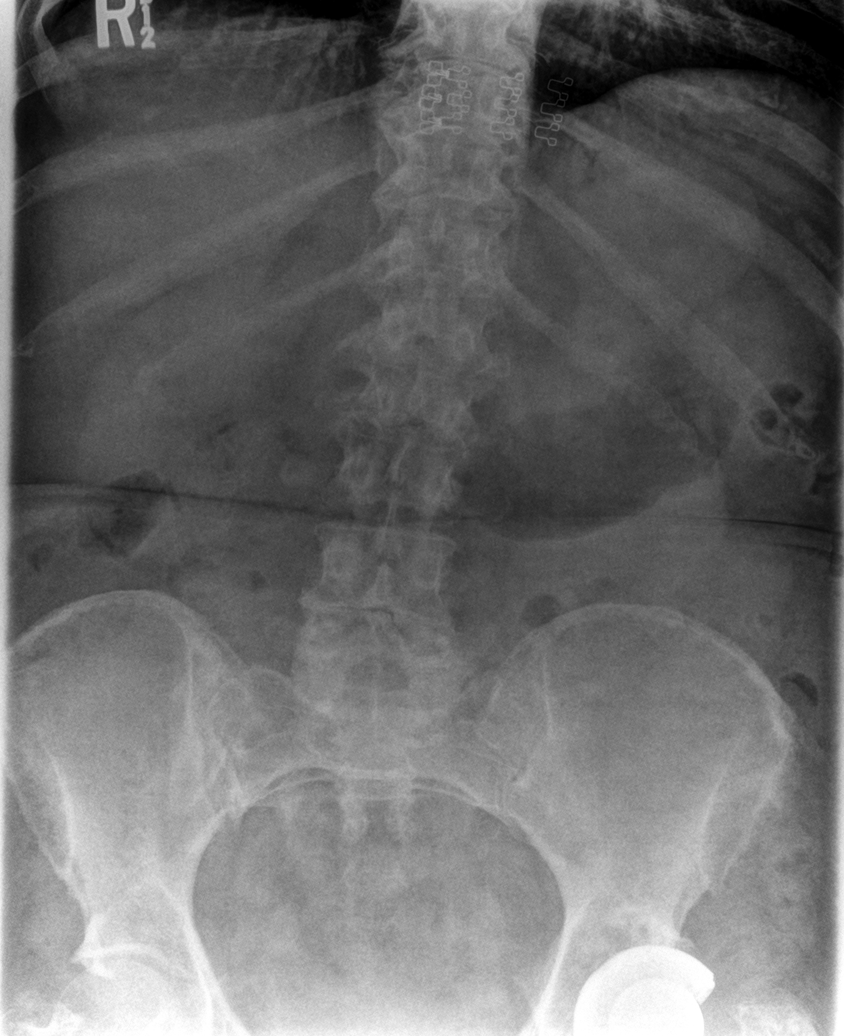
[im 2/5]
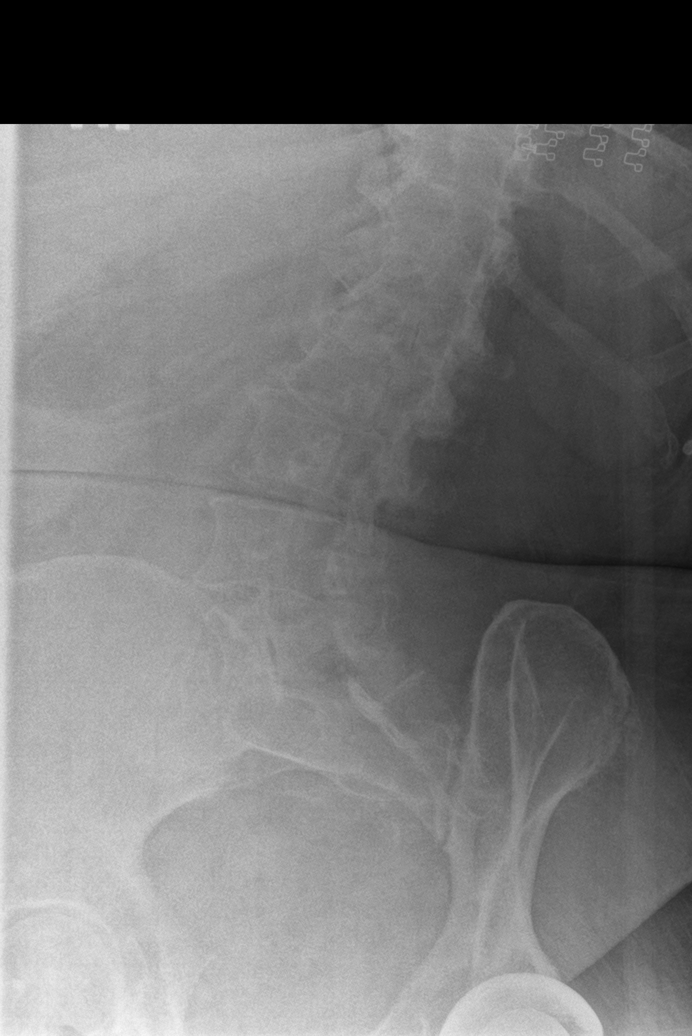
[im 3/5]
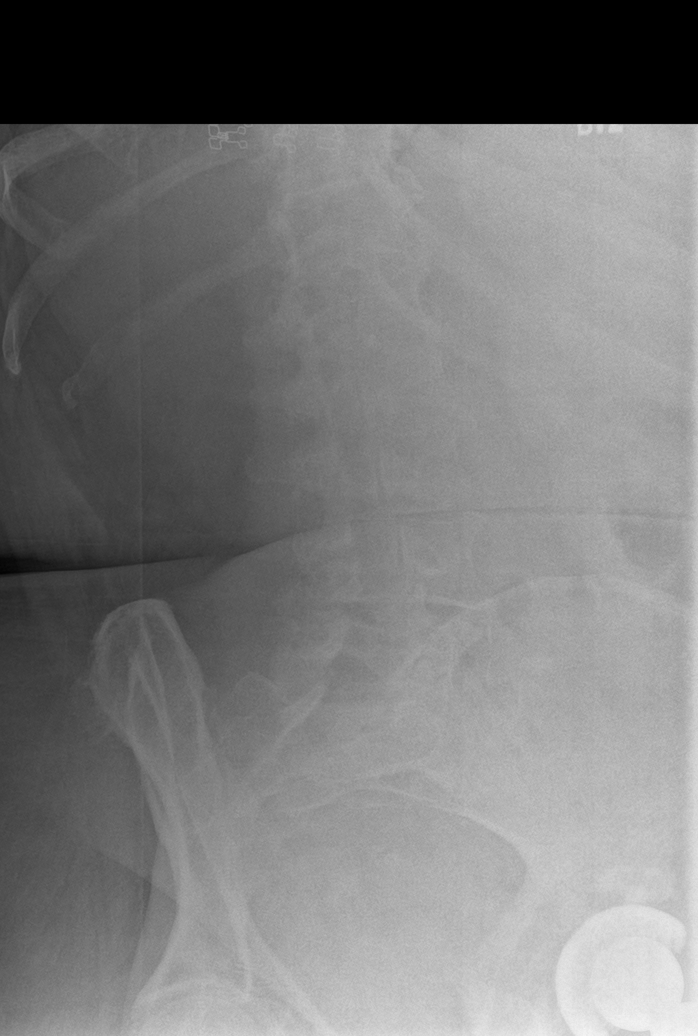
[im 4/5]
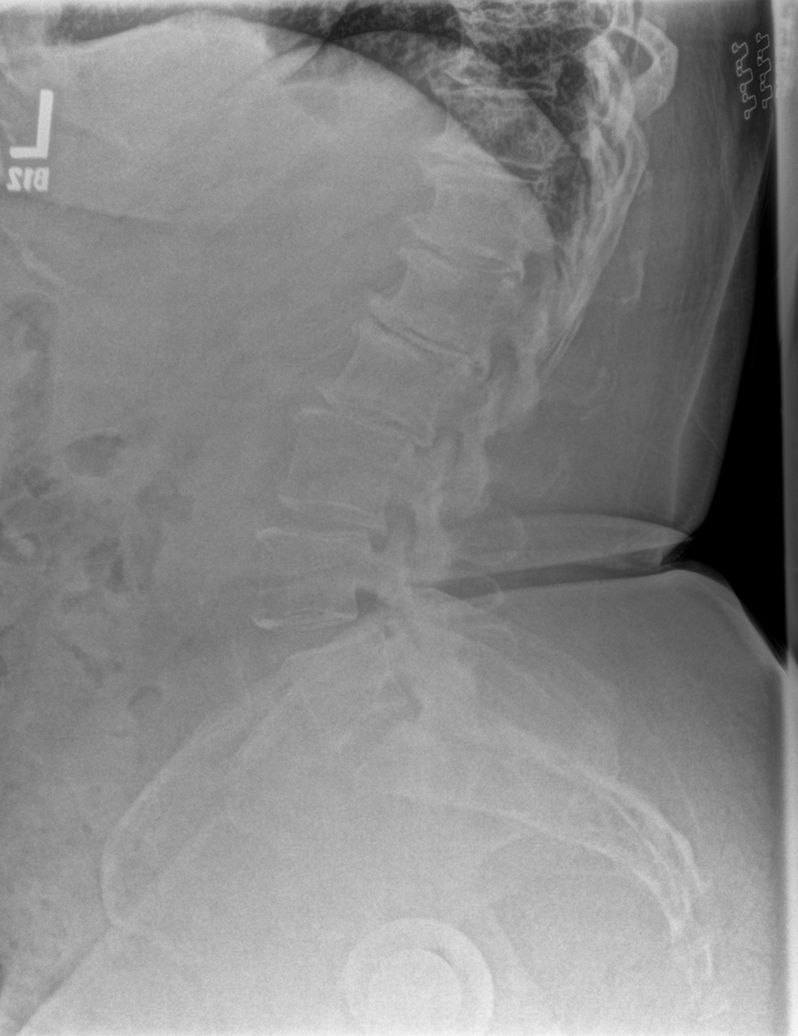
[im 5/5]
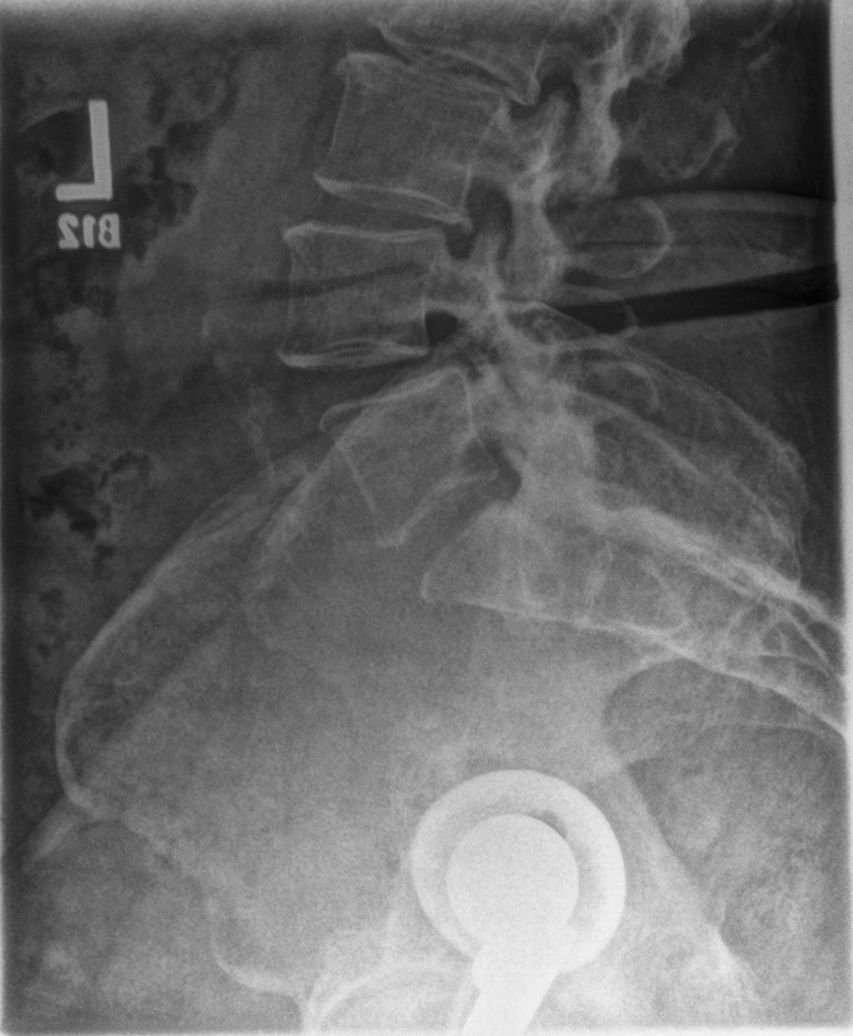

[5 of 5 positions shown; findings below may reference images not displayed]

FINDINGS: Grade 1 anterolisthesis at L4-L5 measuring roughly 8 mm. There is
mild retrolisthesis at L2-L3 and L3-L4. Disc space narrowing at
L1-L2 and L2-L3. No evidence for a pars defect. Spondylolisthesis is
likely related to facet disease. There is a left hip replacement.
Mild curvature in the lumbar spine. Vertebral body heights are
maintained without a fracture.
IMPRESSION: Multilevel degenerative disease throughout the lumbar spine with a
combination of disc and facet disease.

## 2017-06-07 IMAGING — CR DG HIP (WITH OR WITHOUT PELVIS) 2-3V*L*
1 series · 3 of 3 positions shown · non-contrast
Comparison: None.

CLINICAL DATA: Bilateral hip pain

EXAM:
DG HIP (WITH OR WITHOUT PELVIS) 2-3V LEFT

[Series 1: dg hip unilat w or w/o pelvis 2-3 views  · non-contrast · 0.14mm/px · 3 of 3 slices shown]
[im 1/3]
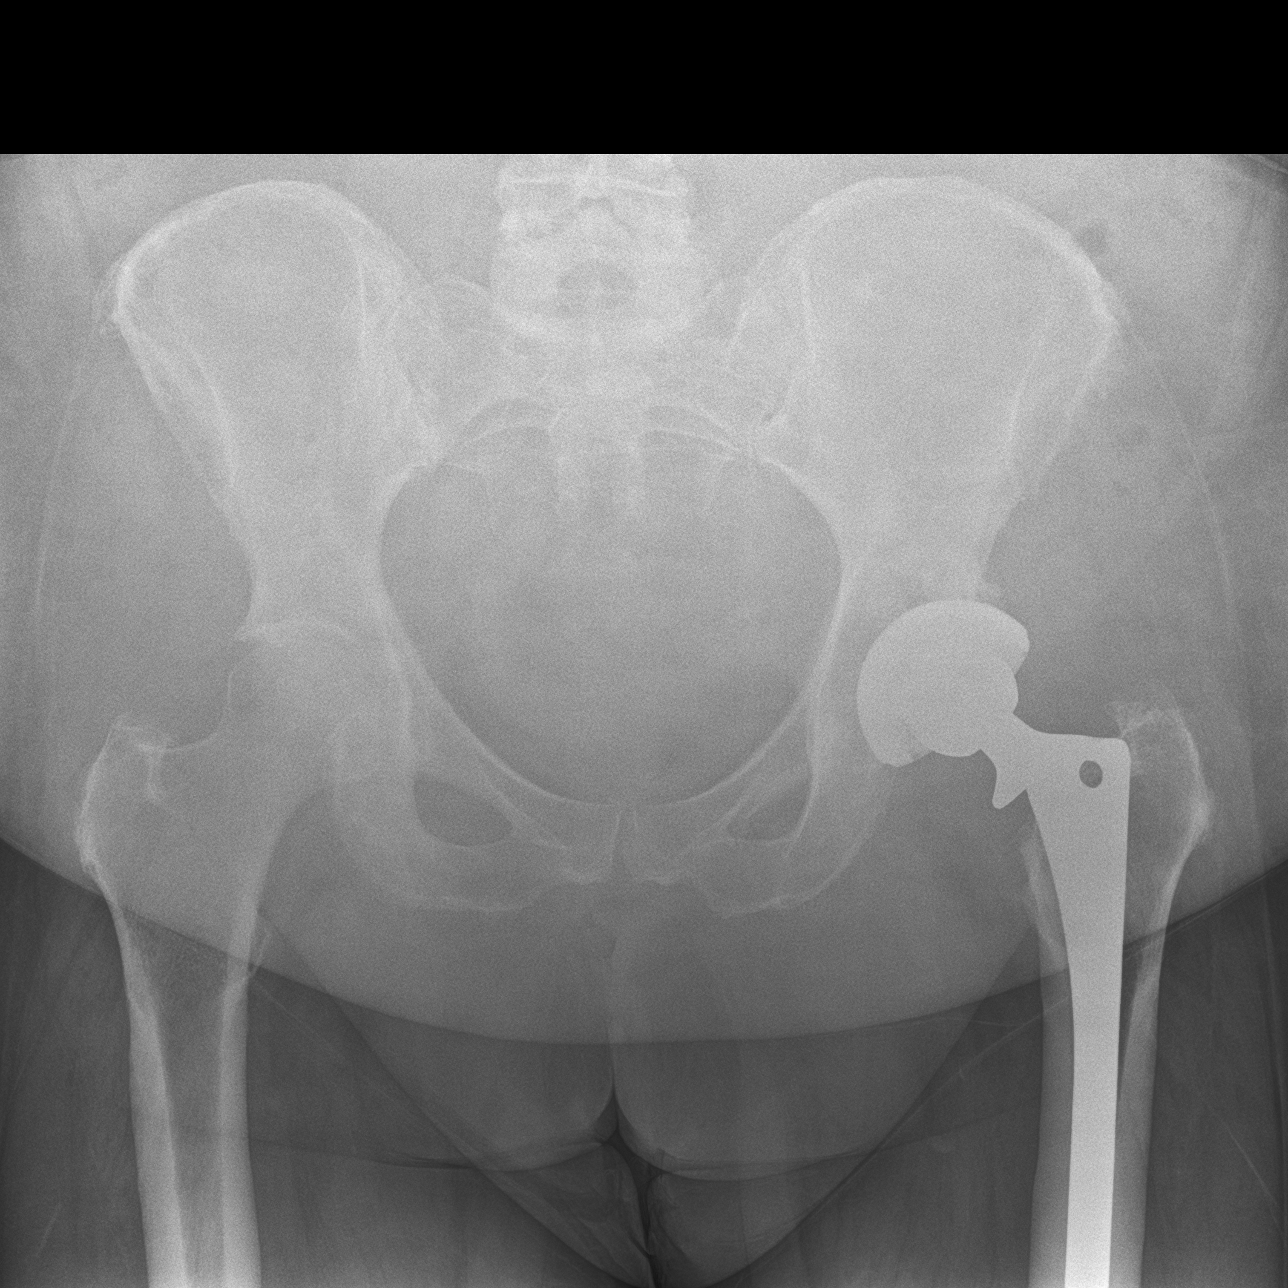
[im 2/3]
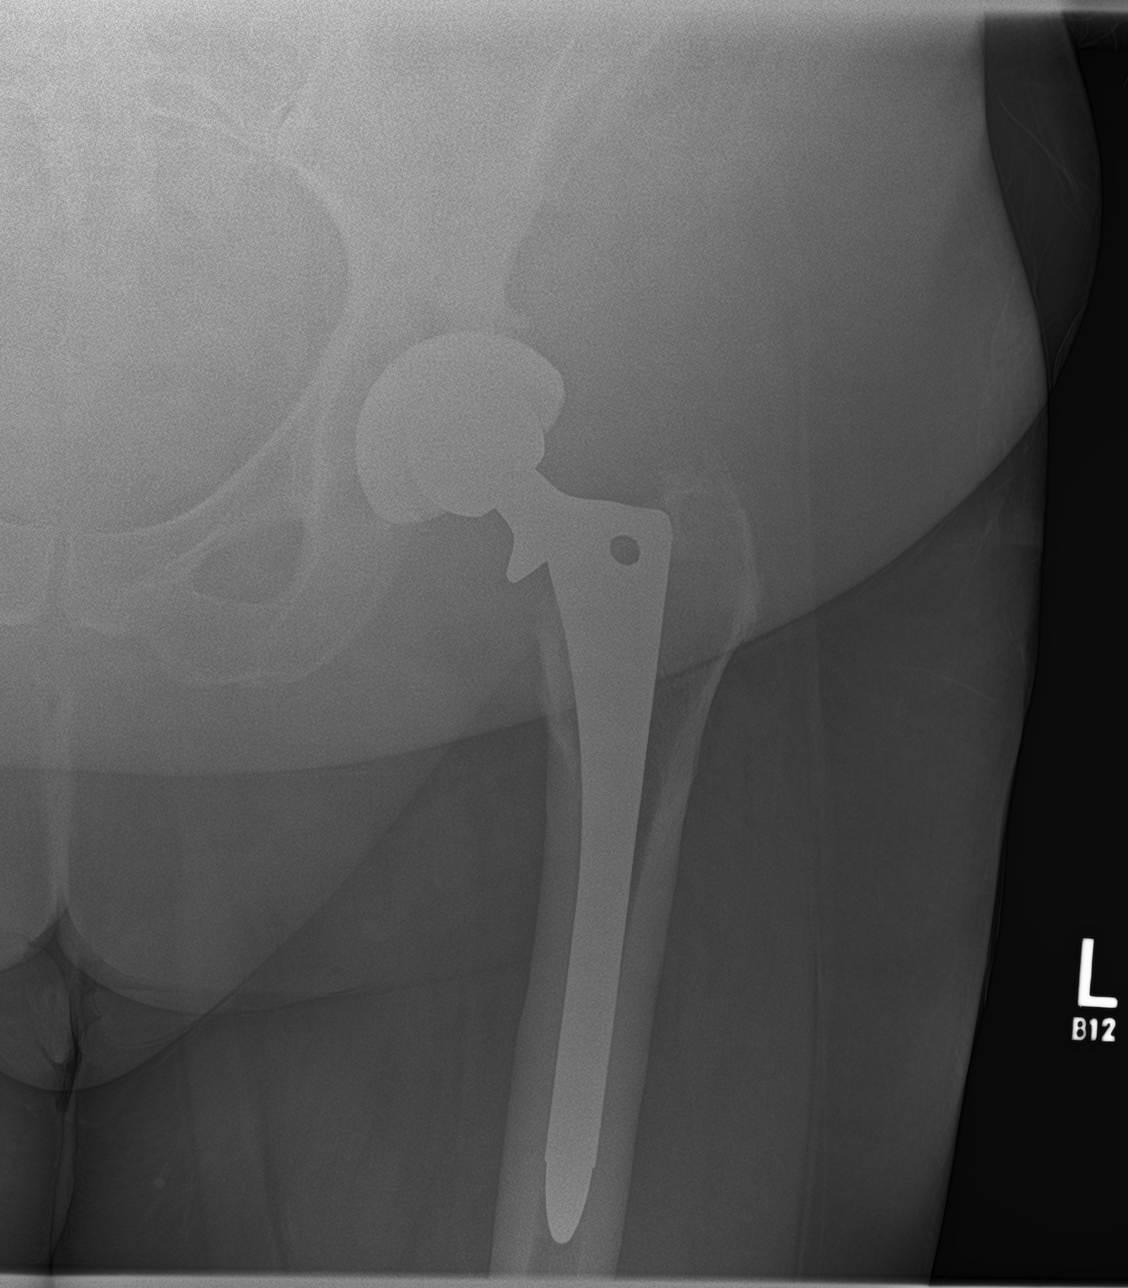
[im 3/3]
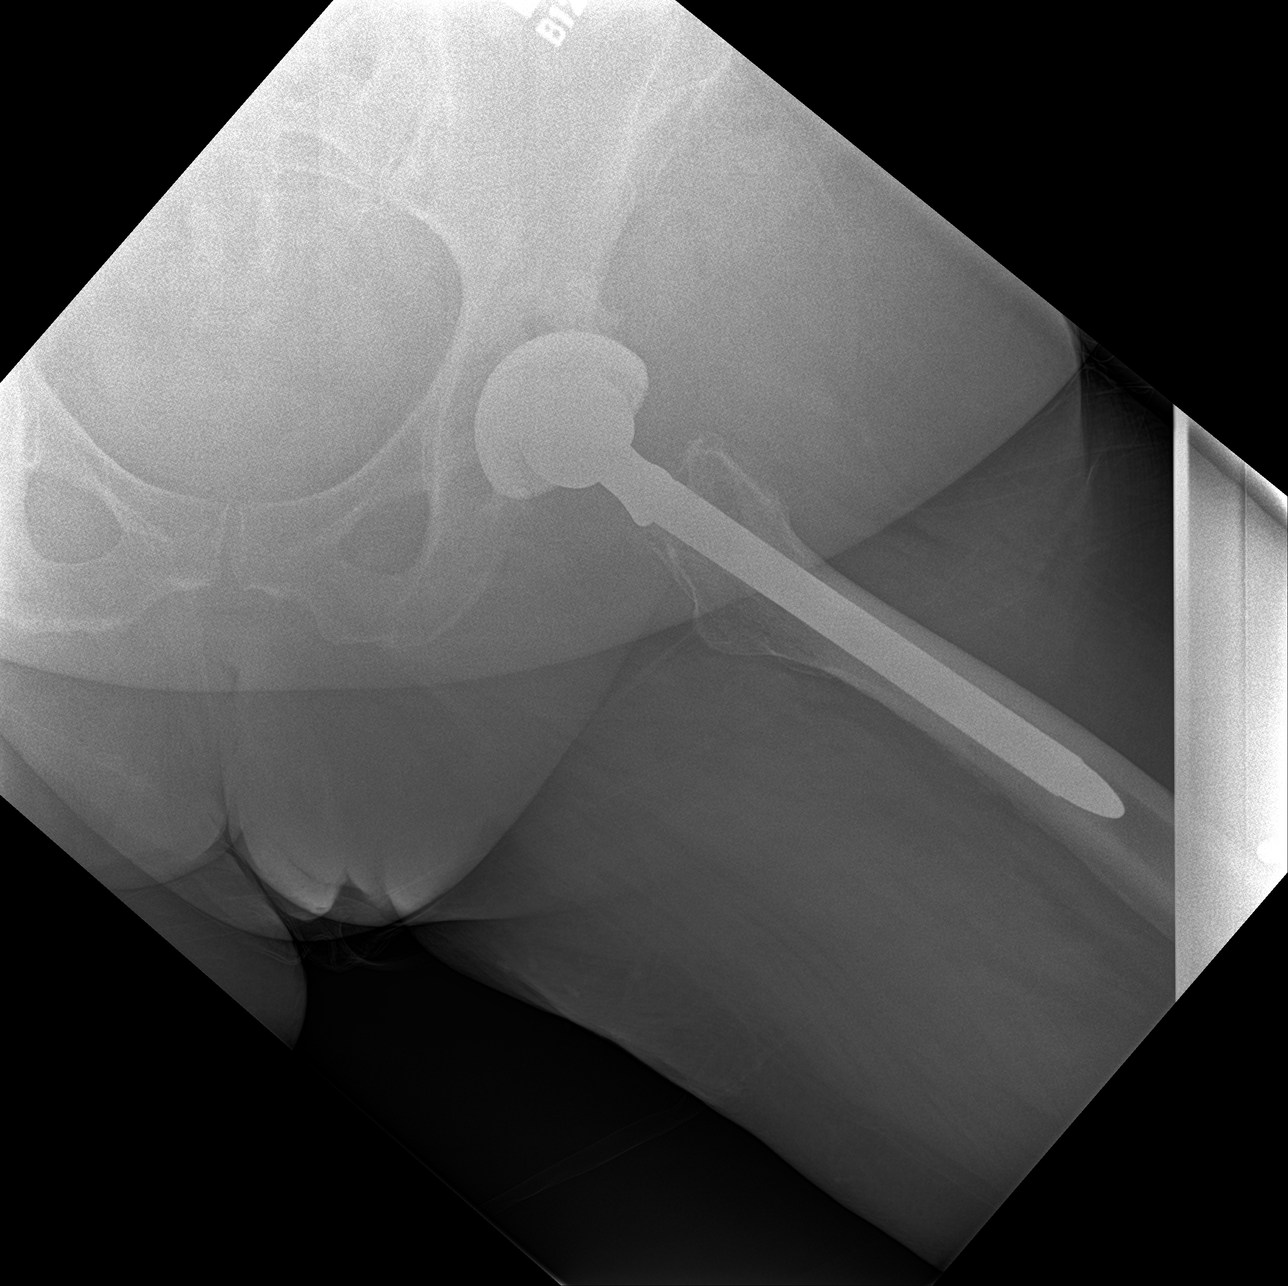

[3 of 3 positions shown; findings below may reference images not displayed]

FINDINGS: Left hip replacement in satisfactory position and alignment. No
hardware loosening or fracture.
IMPRESSION: Satisfactory left hip replacement.

## 2017-06-11 ENCOUNTER — Other Ambulatory Visit: Payer: Self-pay | Admitting: Internal Medicine

## 2017-06-11 DIAGNOSIS — G8921 Chronic pain due to trauma: Secondary | ICD-10-CM

## 2017-06-16 ENCOUNTER — Ambulatory Visit: Payer: BLUE CROSS/BLUE SHIELD | Admitting: Podiatry

## 2017-06-16 ENCOUNTER — Encounter: Payer: Self-pay | Admitting: Podiatry

## 2017-06-16 ENCOUNTER — Encounter

## 2017-06-16 DIAGNOSIS — M722 Plantar fascial fibromatosis: Secondary | ICD-10-CM | POA: Diagnosis not present

## 2017-06-16 NOTE — Progress Notes (Signed)
She presents today chief complaint of bilateral heel pain.  States this about time from injections.  Objective: Vital signs are stable alert oriented x3.  Pulses are palpable.  Neurologic sensorium is intact.  Degenerative flexors are intact.  She has pain to palpation medial calcaneal tubercles bilateral heels.  Assessment: Pain in limb secondary to chronic proximal plantar fasciitis.  Plan: Injected bilateral heels today after sterile Betadine skin prep 20 mg Kenalog 5 mg Marcaine each heel.  Tolerated procedure well without complications.  Follow-up with her in about 3 months.

## 2017-06-18 ENCOUNTER — Ambulatory Visit
Admission: RE | Admit: 2017-06-18 | Discharge: 2017-06-18 | Disposition: A | Discharge status: Home / Self Care | Payer: BLUE CROSS/BLUE SHIELD | Source: Ambulatory Visit | Attending: Internal Medicine | Admitting: Internal Medicine

## 2017-06-18 DIAGNOSIS — G8921 Chronic pain due to trauma: Secondary | ICD-10-CM

## 2017-06-18 DIAGNOSIS — M48061 Spinal stenosis, lumbar region without neurogenic claudication: Secondary | ICD-10-CM | POA: Diagnosis not present

## 2017-06-18 DIAGNOSIS — M8938 Hypertrophy of bone, other site: Secondary | ICD-10-CM | POA: Diagnosis not present

## 2017-06-18 DIAGNOSIS — M47816 Spondylosis without myelopathy or radiculopathy, lumbar region: Secondary | ICD-10-CM | POA: Diagnosis not present

## 2017-06-18 DIAGNOSIS — M545 Low back pain: Secondary | ICD-10-CM | POA: Diagnosis not present

## 2017-06-18 IMAGING — CT CT L SPINE W/O CM
3 of 4 series · 13 of 33 positions shown, 16 images · non-contrast
Comparison: Lumbar spine radiographs [DATE]

CLINICAL DATA: Progressive low back pain numbness extending into
the lower extremities bilaterally. Symptoms for 3 months. Chronic
pain due to trauma.

EXAM:
CT LUMBAR SPINE WITHOUT CONTRAST
TECHNIQUE: Multidetector CT imaging of the lumbar spine was performed without
intravenous contrast administration. Multiplanar CT image
reconstructions were also generated.

[Series 2: sag bone l-spine · sagittal · 0.37mm/px · 5 of 78 slices shown, 6 images]
[im 26/78  bone]
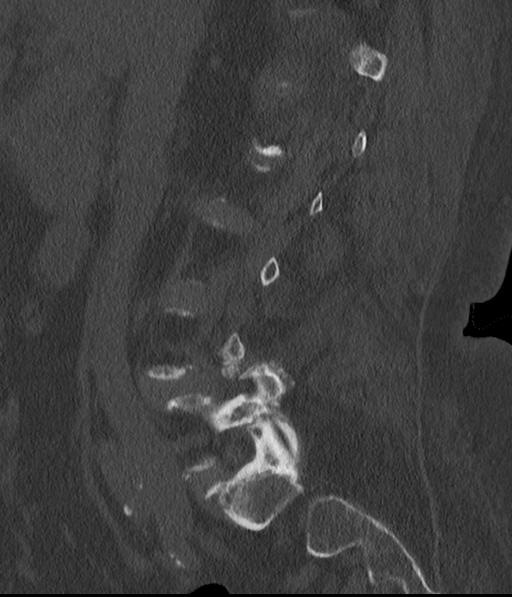
[im 33/78  bone]
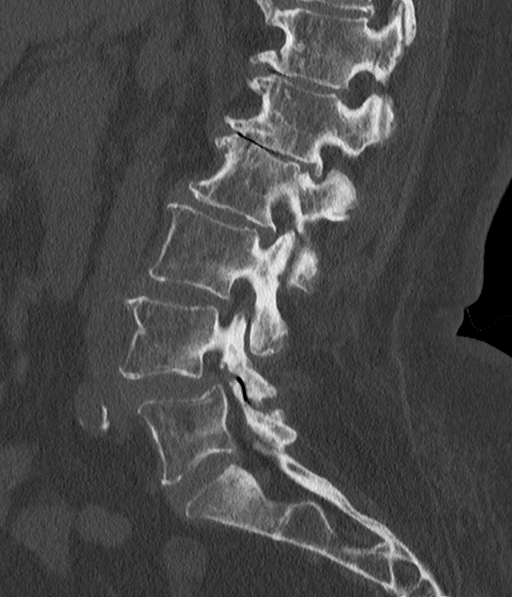
[im 39/78  soft-tissue]
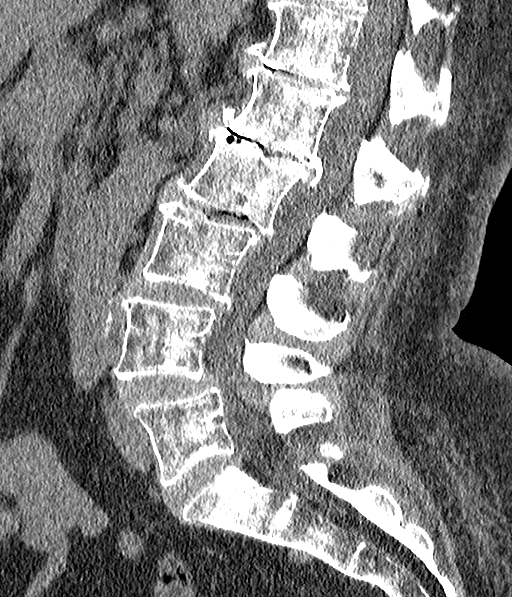
[im 39/78  bone]
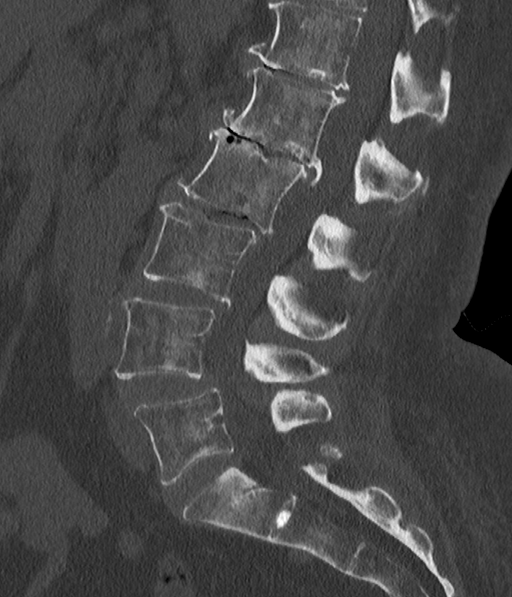
[im 45/78  bone]
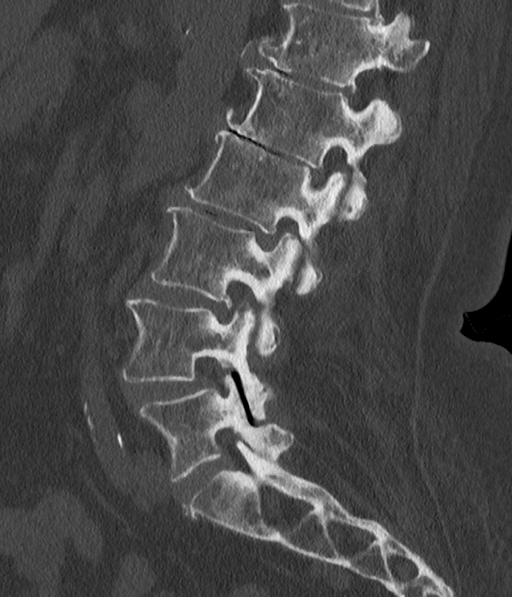
[im 52/78  bone]
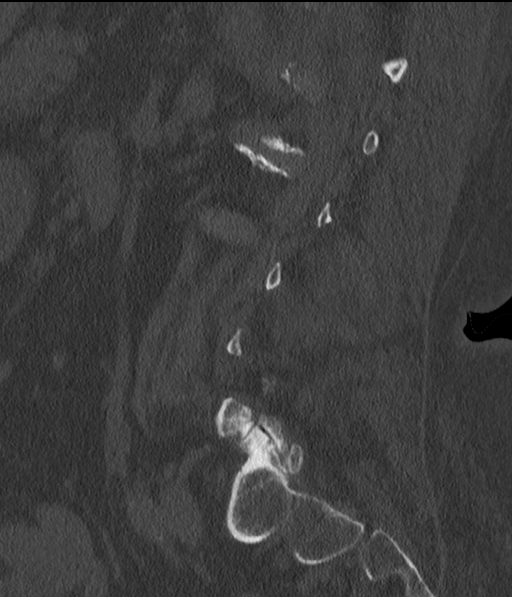

[Series 4: lspine st l-spine · axial · 0.37mm/px · z∈[-1368,-1222]mm · 5 of 111 slices shown, 7 images]
[im 19/111  soft-tissue]
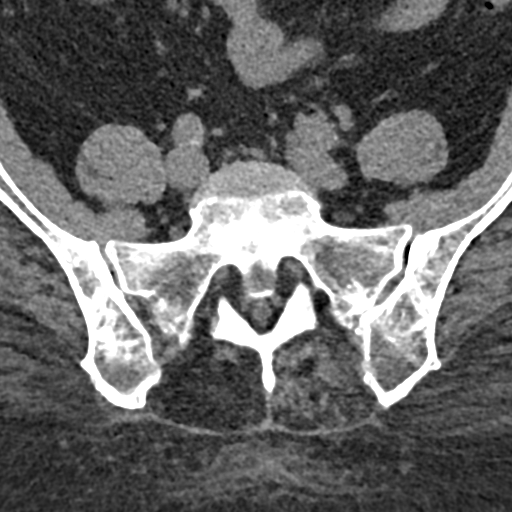
[im 19/111  bone]
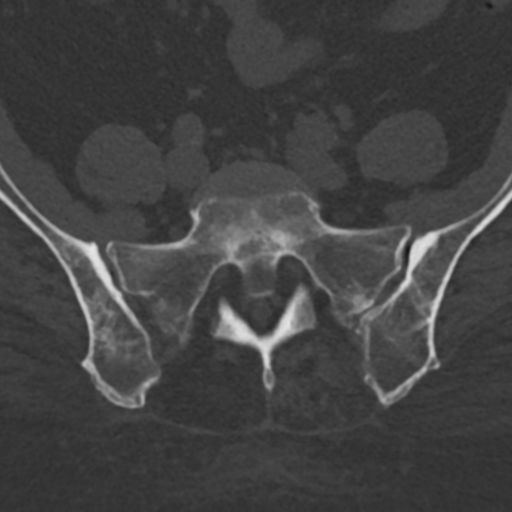
[im 37/111  bone]
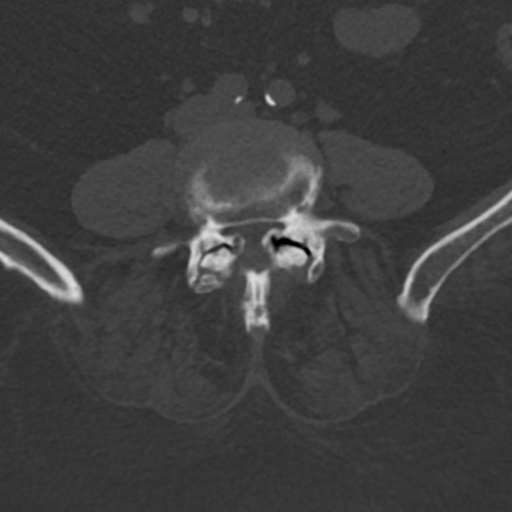
[im 56/111  bone]
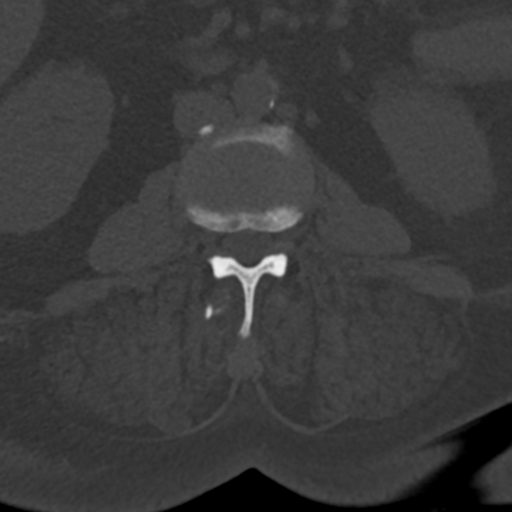
[im 74/111  bone]
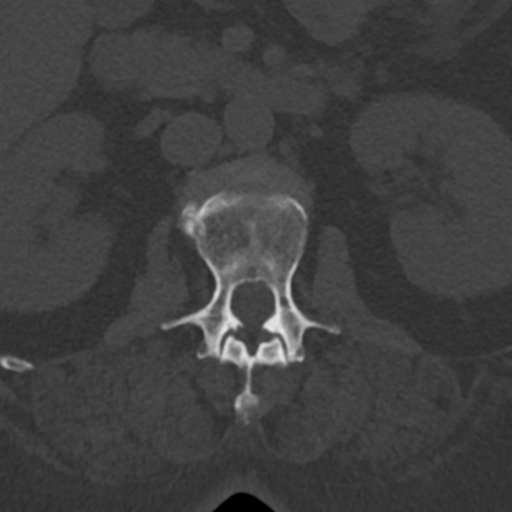
[im 92/111  soft-tissue]
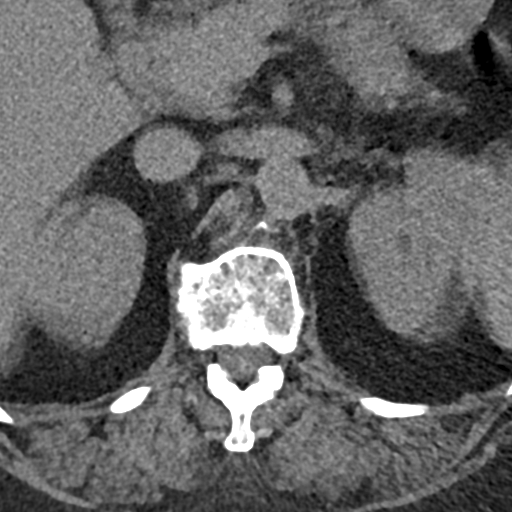
[im 92/111  bone]
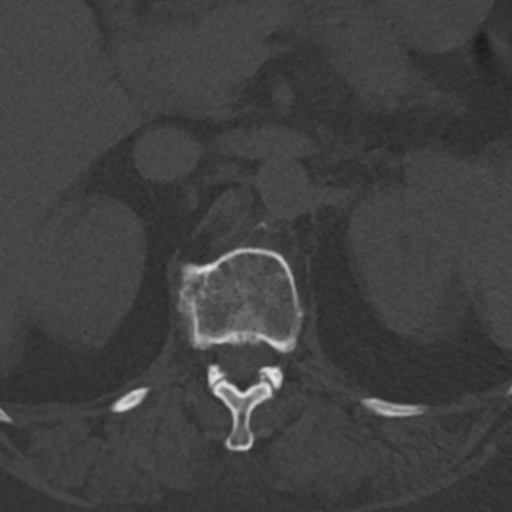

[Series 6: cor bone l-spine · coronal · 0.30mm/px · 3 of 95 slices shown]
[im 19/95  bone]
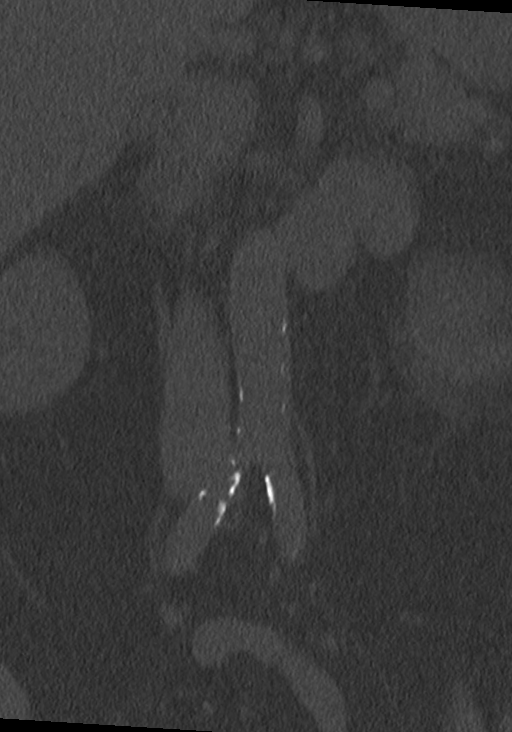
[im 38/95  bone]
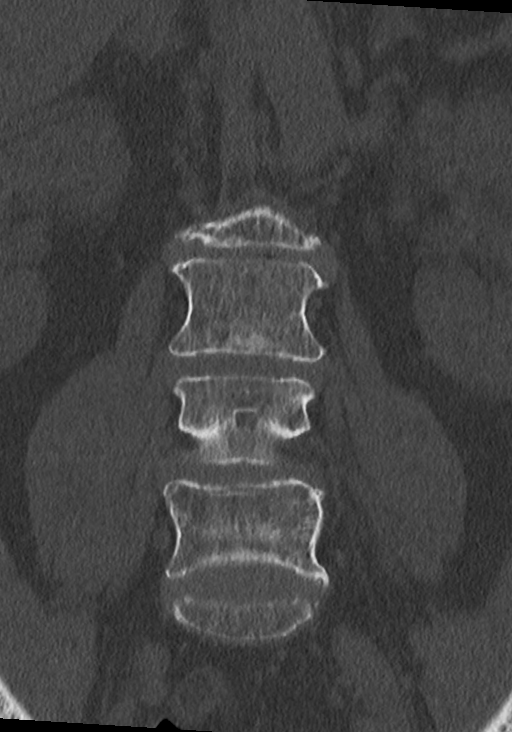
[im 57/95  bone]
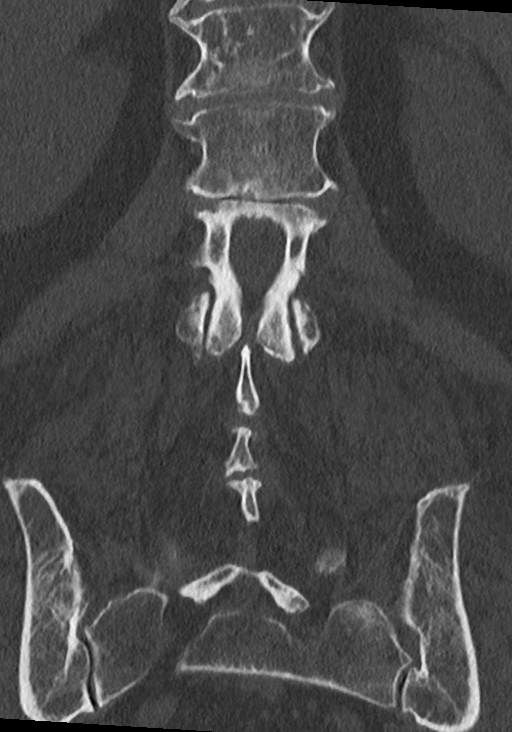

[13 of 33 positions shown; findings below may reference images not displayed]

FINDINGS: Segmentation: 5 non rib-bearing lumbar type vertebral bodies are
present. The lowest fully formed vertebral body is L5.

Alignment: Grade 1 anterolisthesis at L4-5 measures 7 mm. Slight
retrolisthesis is present at L1-2, L2-3, and L3-4.

Vertebrae: Chronic endplate sclerotic changes are most evident at
L1-2 on the right. Vertebral body heights are maintained. No acute
or healing fractures are present.

Paraspinal and other soft tissues: Mild atherosclerotic changes are
present in the distal aorta and proximal iliac vessels bilaterally.
There is no aneurysm.

Disc levels: T12-L1: There is chronic loss of disc height. Endplate
spurring and mild facet disease is present. No significant stenosis
is present.

L1-2: Chronic loss of disc height is present. Broad-based disc
protrusion is present. Moderate facet hypertrophy is present
bilaterally. This results an moderate subarticular and mild
bilateral foraminal narrowing.

L2-3: Chronic loss of disc height is present. A broad-based disc
protrusion is present. A vacuum disc is present moderate facet
hypertrophy is noted bilaterally. Mild subarticular narrowing and
moderate bilateral foraminal stenosis is present.

L3-4: A leftward disc protrusion is present. Facet hypertrophy is
worse on the left. Mild left subarticular narrowing is present.
Moderate foraminal stenosis is present bilaterally.

L4-5: Severe central canal stenosis is secondary to 7 mm
anterolisthesis associated with severe facet hypertrophy resulting
and uncovering of a broad-based disc protrusion. Moderate foraminal
narrowing is evident bilaterally.

L5-S1: Moderate facet hypertrophy is present bilaterally. Disc
bulges to the right without significant stenosis.
IMPRESSION: 1. Multilevel spondylosis of the lumbar spine as described.
2. Advanced facet hypertrophy with 7 mm anterolisthesis and severe
central canal stenosis at L4-5, potentially impacting the L5 and S1
nerve roots bilaterally.
3. Moderate bilateral foraminal stenosis at L4-5.
4. Mild left subarticular narrowing at L3-4 with moderate bilateral
foraminal stenosis.
5. Mild subarticular and moderate bilateral foraminal narrowing at
L2-3.
6. Moderate subarticular and mild bilateral foraminal stenosis at
L1-2

## 2017-07-13 DIAGNOSIS — M4727 Other spondylosis with radiculopathy, lumbosacral region: Secondary | ICD-10-CM | POA: Insufficient documentation

## 2017-07-13 DIAGNOSIS — E1142 Type 2 diabetes mellitus with diabetic polyneuropathy: Secondary | ICD-10-CM | POA: Insufficient documentation

## 2017-07-13 DIAGNOSIS — M2550 Pain in unspecified joint: Secondary | ICD-10-CM | POA: Insufficient documentation

## 2017-08-09 ENCOUNTER — Encounter: Payer: Self-pay | Admitting: Podiatry

## 2017-08-09 ENCOUNTER — Ambulatory Visit: Payer: BLUE CROSS/BLUE SHIELD | Admitting: Podiatry

## 2017-08-09 DIAGNOSIS — M722 Plantar fascial fibromatosis: Secondary | ICD-10-CM

## 2017-08-09 NOTE — Progress Notes (Signed)
She presents today for bilateral heel pain.  States that she is about to [place] in [place] and like to be able to walk.  Objective: Vital signs are stable she is alert and oriented x3.  Pulses are palpable.  Neurologic sensorium is intact deep tendon reflexes are intact muscle strength is normal symmetrical bilateral.  Orthopedic evaluation demonstrates all joints distal ankle full range of motion without crepitation.  She has pain to palpation medial continue tubercle bilateral.  Assessment: Pain in limb secondary to plantar fasciitis bilateral.  Plan: Injected the bilateral heels today with 20 mg Kenalog 5 mg Marcaine point maximal tenderness after the sterile Betadine skin prep of the bilateral heels.  Tolerated procedure well follow-up with her on as-needed basis.

## 2017-09-07 ENCOUNTER — Other Ambulatory Visit: Payer: Self-pay

## 2017-09-07 ENCOUNTER — Ambulatory Visit
Payer: BLUE CROSS/BLUE SHIELD | Attending: Student in an Organized Health Care Education/Training Program | Admitting: Student in an Organized Health Care Education/Training Program

## 2017-09-07 ENCOUNTER — Encounter: Payer: Self-pay | Admitting: Student in an Organized Health Care Education/Training Program

## 2017-09-07 VITALS — BP 152/82 | HR 84 | Temp 98.7°F | Resp 16 | Ht 62.0 in | Wt 222.0 lb

## 2017-09-07 DIAGNOSIS — M9983 Other biomechanical lesions of lumbar region: Secondary | ICD-10-CM | POA: Diagnosis not present

## 2017-09-07 DIAGNOSIS — M5416 Radiculopathy, lumbar region: Secondary | ICD-10-CM

## 2017-09-07 DIAGNOSIS — E1142 Type 2 diabetes mellitus with diabetic polyneuropathy: Secondary | ICD-10-CM | POA: Diagnosis not present

## 2017-09-07 DIAGNOSIS — Z96642 Presence of left artificial hip joint: Secondary | ICD-10-CM

## 2017-09-07 DIAGNOSIS — M4727 Other spondylosis with radiculopathy, lumbosacral region: Secondary | ICD-10-CM | POA: Insufficient documentation

## 2017-09-07 DIAGNOSIS — G894 Chronic pain syndrome: Secondary | ICD-10-CM | POA: Insufficient documentation

## 2017-09-07 DIAGNOSIS — M48061 Spinal stenosis, lumbar region without neurogenic claudication: Secondary | ICD-10-CM

## 2017-09-07 DIAGNOSIS — M2550 Pain in unspecified joint: Secondary | ICD-10-CM | POA: Diagnosis not present

## 2017-09-07 MED ORDER — GABAPENTIN 300 MG PO CAPS: 600.0000 mg | ORAL_CAPSULE | Freq: Every day | ORAL | 2 refills | Status: DC

## 2017-09-07 NOTE — Patient Instructions (Addendum)
__Pick up Gabapentin Rx at the pharmacy. __________________________________________________________________________________________  Preparing for Procedure with Sedation  Instructions: . Oral Intake: Do not eat or drink anything for at least 8 hours prior to your procedure. . Transportation: Public transportation is not allowed. Bring an adult driver. The driver must be physically present in our waiting room before any procedure can be started. Marland Kitchen. Physical Assistance: Bring an adult physically capable of assisting you, in the event you need help. This adult should keep you company at home for at least 6 hours after the procedure. . Blood Pressure Medicine: Take your blood pressure medicine with a sip of water the morning of the procedure. . Blood thinners: Notify our staff if you are taking any blood thinners. Depending on which one you take, there will be specific instructions on how and when to stop it. . Diabetics on insulin: Notify the staff so that you can be scheduled 1st case in the morning. If your diabetes requires high dose insulin, take only  of your normal insulin dose the morning of the procedure and notify the staff that you have done so. . Preventing infections: Shower with an antibacterial soap the morning of your procedure. . Build-up your immune system: Take 1000 mg of Vitamin C with every meal (3 times a day) the day prior to your procedure. Marland Kitchen. Antibiotics: Inform the staff if you have a condition or reason that requires you to take antibiotics before dental procedures. . Pregnancy: If you are pregnant, call and cancel the procedure. . Sickness: If you have a cold, fever, or any active infections, call and cancel the procedure. . Arrival: You must be in the facility at least 30 minutes prior to your scheduled procedure. . Children: Do not bring children with you. . Dress appropriately: Bring dark clothing that you would not mind if they get stained. . Valuables: Do not bring any  jewelry or valuables.  Procedure appointments are reserved for interventional treatments only. Marland Kitchen. No Prescription Refills. . No medication changes will be discussed during procedure appointments. . No disability issues will be discussed.  Reasons to call and reschedule or cancel your procedure: (Following these recommendations will minimize the risk of a serious complication.) . Surgeries: Avoid having procedures within 2 weeks of any surgery. (Avoid for 2 weeks before or after any surgery). . Flu Shots: Avoid having procedures within 2 weeks of a flu shots or . (Avoid for 2 weeks before or after immunizations). . Barium: Avoid having a procedure within 7-10 days after having had a radiological study involving the use of radiological contrast. (Myelograms, Barium swallow or enema study). . Heart attacks: Avoid any elective procedures or surgeries for the initial 6 months after a "Myocardial Infarction" (Heart Attack). . Blood thinners: It is imperative that you stop these medications before procedures. Let us know if you if you take any blood thinner.  . Infection: Avoid procedures during or within two weeks of an infection (including chest colds or gastrointestinal problems). Symptoms associated with infections include: Localized redness, fever, chills, night sweats or profuse sweating, burning sensation when voiding, cough, congestion, stuffiness, runny nose, sore throat, diarrhea, nausea, vomiting, cold or Flu symptoms, recent or current infections. It is specially important if the infection is over the area that we intend to treat. Marland Kitchen. Heart and lung problems: Symptoms that may suggest an active cardiopulmonary problem include: cough, chest pain, breathing difficulties or shortness of breath, dizziness, ankle swelling, uncontrolled high or unusually low blood pressure, and/or palpitations. If you  are experiencing any of these symptoms, cancel your procedure and contact your primary care physician for  an evaluation.  Remember:  Regular Business hours are:  Monday to Thursday 8:00 AM to 4:00 PM  Provider's Schedule: Delano Metz, MD:  Procedure days: Tuesday and Thursday 7:30 AM to 4:00 PM  Edward Jolly, MD:  Procedure days: Monday and Wednesday 7:30 AM to 4:00 PM ____________________________________________________________________________________________     Epidural Steroid Injection Patient Information  Description: The epidural space surrounds the nerves as they exit the spinal cord.  In some patients, the nerves can be compressed and inflamed by a bulging disc or a tight spinal canal (spinal stenosis).  By injecting steroids into the epidural space, we can bring irritated nerves into direct contact with a potentially helpful medication.  These steroids act directly on the irritated nerves and can reduce swelling and inflammation which often leads to decreased pain.  Epidural steroids may be injected anywhere along the spine and from the neck to the low back depending upon the location of your pain.   After numbing the skin with local anesthetic (like Novocaine), a small needle is passed into the epidural space slowly.  You may experience a sensation of pressure while this is being done.  The entire block usually last less than 10 minutes.  Conditions which may be treated by epidural steroids:   Low back and leg pain  Neck and arm pain  Spinal stenosis  Post-laminectomy syndrome  Herpes zoster (shingles) pain  Pain from compression fractures  Preparation for the injection:  1. Do not eat any solid food or dairy products within 8 hours of your appointment.  2. You may drink clear liquids up to 3 hours before appointment.  Clear liquids include water, black coffee, juice or soda.  No milk or cream please. 3. You may take your regular medication, including pain medications, with a sip of water before your appointment  Diabetics should hold regular insulin (if taken  separately) and take 1/2 normal NPH dos the morning of the procedure.  Carry some sugar containing items with you to your appointment. 4. A driver must accompany you and be prepared to drive you home after your procedure.  5. Bring all your current medications with your. 6. An IV may be inserted and sedation may be given at the discretion of the physician.   7. A blood pressure cuff, EKG and other monitors will often be applied during the procedure.  Some patients may need to have extra oxygen administered for a short period. 8. You will be asked to provide medical information, including your allergies, prior to the procedure.  We must know immediately if you are taking blood thinners (like Coumadin/Warfarin)  Or if you are allergic to IV iodine contrast (dye). We must know if you could possible be pregnant.  Possible side-effects:  Bleeding from needle site  Infection (rare, may require surgery)  Nerve injury (rare)  Numbness & tingling (temporary)  Difficulty urinating (rare, temporary)  Spinal headache ( a headache worse with upright posture)  Light -headedness (temporary)  Pain at injection site (several days)  Decreased blood pressure (temporary)  Weakness in arm/leg (temporary)  Pressure sensation in back/neck (temporary)  Call if you experience:  Fever/chills associated with headache or increased back/neck pain.  Headache worsened by an upright position.  New onset weakness or numbness of an extremity below the injection site  Hives or difficulty breathing (go to the emergency room)  Inflammation or drainage at the  infection site  Severe back/neck pain  Any new symptoms which are concerning to you  Please note:  Although the local anesthetic injected can often make your back or neck feel good for several hours after the injection, the pain will likely return.  It takes 3-7 days for steroids to work in the epidural space.  You may not notice any pain relief for  at least that one week.  If effective, we will often do a series of three injections spaced 3-6 weeks apart to maximally decrease your pain.  After the initial series, we generally will wait several months before considering a repeat injection of the same type.  If you have any questions, please call [phone] [facility]

## 2017-09-07 NOTE — Progress Notes (Signed)
Patient's Name: Courtney Henderson  MRN: [id number]  Referring Provider: Cletis Athens, MD  DOB: December 10, 1960  PCP: Cletis Athens, MD  DOS: 09/07/2017  Note by: Gillis Santa, MD  Service setting: Ambulatory outpatient  Specialty: Interventional Pain Management  Location: [facility] (AMB) Pain Management Facility  Visit type: Initial Patient Evaluation  Patient type: New Patient   Primary Reason(s) for Visit: Encounter for initial evaluation of one or more chronic problems (new to examiner) potentially causing chronic pain, and posing a threat to normal musculoskeletal function. (Level of risk: High) CC: New Patient (Initial Visit)  HPI  Courtney Henderson is a 57 y.o. year old, female patient, who comes today to see [place] for the first time for an initial evaluation of her chronic pain. She has Lumbosacral spondylosis with radiculopathy; Polyarthralgia; and Diabetic polyneuropathy associated with type 2 diabetes mellitus ([place]) on their problem list. Today she comes in for evaluation of her New Patient (Initial Visit)  Pain Assessment: Location: Lower, Left, Right Back Radiating: Radiates down to back of things, back of legs, down to bottom of feet.  Onset: More than a month ago Duration: Chronic pain Quality: Stabbing, Sharp, Tingling Severity: 6 /10 (subjective, self-reported pain score)  Note: Reported level is compatible with observation.                         When using our objective Pain Scale, levels between 6 and 10/10 are said to belong in an emergency room, as it progressively worsens from a 6/10, described as severely limiting, requiring emergency care not usually available at an outpatient pain management facility. At a 6/10 level, communication becomes difficult and requires great effort. Assistance to reach the emergency department may be required. Facial flushing and profuse sweating along with potentially dangerous increases in heart rate and blood pressure will be evident. Effect on ADL: "Limites me  with activities"  Timing: Intermittent Modifying factors: Ibuprogfen, resting, and sitting  BP: (!) 152/82  HR: 84  Onset and Duration: Sudden Cause of pain: stenosis Severity: No change since onset, NAS-11 at its worse: 8.5/10, NAS-11 at its best: 5/10, NAS-11 now: 7/10 and NAS-11 on the average: 6.5/10 Timing: Not influenced by the time of the day Aggravating Factors: Bending, Lifiting, Motion, Prolonged sitting, Prolonged standing, Twisting and Walking uphill Alleviating Factors: Medications, Resting, Sitting and Chiropractic manipulations Associated Problems: Numbness, Sweating, Swelling, Temperature changes, Tingling and Weakness Quality of Pain: Agonizing, Dreadful, Feeling of constriction, Sharp, Splitting, Stabbing and Tingling Previous Examinations or Tests: CT scan, X-rays, Neurosurgical evaluation and Chiropractic evaluation Previous Treatments: Steroid treatments by mouth  The patient comes into the clinics today for the first time for a chronic pain management evaluation.   57 year old female with a history of type 2 diabetes who presents with axial low back pain that radiates into bilateral buttocks, posterior and lateral thighs, left greater than right.  Patient denies any radiation past her knees.  She denies any bowel or bladder dysfunction.  Patient has participated in physical therapy in the past.  She has also utilizing NSAIDs including ibuprofen 600 mg 3 times daily to 4 times daily as needed.  Patient is also utilizing gabapentin 300 mg nightly.  She states that her daytime dose makes her too sleepy and she is taking care of her grandchildren during the day.  Currently not on any opioid therapy.  Today I took the time to provide the patient with information regarding my pain practice. The patient was informed  that my practice is divided into two sections: an interventional pain management section, as well as a completely separate and distinct medication management section.  I explained that I have procedure days for my interventional therapies, and evaluation days for follow-ups and medication management. Because of the amount of documentation required during both, they are kept separated. This means that there is the possibility that she may be scheduled for a procedure on one day, and medication management the next. I have also informed her that because of staffing and facility limitations, I no longer take patients for medication management only. To illustrate the reasons for this, I gave the patient the example of surgeons, and how inappropriate it would be to refer a patient to his/her care, just to write for the post-surgical antibiotics on a surgery done by a different surgeon.   Because interventional pain management is my board-certified specialty, the patient was informed that joining my practice means that they are open to any and all interventional therapies. I made it clear that this does not mean that they will be forced to have any procedures done. What this means is that I believe interventional therapies to be essential part of the diagnosis and proper management of chronic pain conditions. Therefore, patients not interested in these interventional alternatives will be better served under the care of a different practitioner.  The patient was also made aware of my Comprehensive Pain Management Safety Guidelines where by joining my practice, they limit all of their nerve blocks and joint injections to those done by our practice, for as long as we are retained to manage their care.   Historic Controlled Substance Pharmacotherapy Review  PMP and historical list of controlled substances: 02/10/2017 02/10/2017  Hydrocodone-Chlorphen Er Susp 120.00 24 10.00 MME Medications: The patient did not bring the medication(s) to the appointment, as requested in our "New Patient Package" Pharmacodynamics: Desired effects: Analgesia: The patient reports <50%  benefit. Reported improvement in function: The patient reports medication allows her to accomplish basic ADLs. Clinically meaningful improvement in function (CMIF): Sustained CMIF goals met Perceived effectiveness: Described as relatively effective, allowing for increase in activities of daily living (ADL) Undesirable effects: Side-effects or Adverse reactions: None reported Historical Monitoring: The patient  has no drug history on file. List of all UDS Test(s): No results found for: MDMA, COCAINSCRNUR, [place], [place], CANNABQUANT, [place], [place] List of other Serum/Urine Drug Screening Test(s):  No results found for: AMPHSCRSER, BARBSCRSER, BENZOSCRSER, COCAINSCRSER, COCAINSCRNUR, PCPSCRSER, PCPQUANT, THCSCRSER, THCU, CANNABQUANT, OPIATESCRSER, OXYSCRSER, PROPOXSCRSER, ETH Historical Background Evaluation: [place] PMP: Six (6) year initial data search conducted.             [place] Department of public safety, offender search: Editor, commissioning Information) Non-contributory Risk Assessment Profile: Aberrant behavior: None observed or detected today Risk factors for fatal opioid overdose: None identified today Fatal overdose hazard ratio (HR): Calculation deferred Non-fatal overdose hazard ratio (HR): Calculation deferred Risk of opioid abuse or dependence: 0.7-3.0% with doses ? 36 MME/day and 6.1-26% with doses ? 120 MME/day. Substance use disorder (SUD) risk level: See below Personal History of Substance Abuse (SUD-Substance use disorder):  Alcohol: Negative  Illegal Drugs: Negative  Rx Drugs: Negative  ORT Risk Level calculation: Low Risk Opioid Risk Tool - 09/07/17 0929      Family History of Substance Abuse   Alcohol  Negative    Illegal Drugs  Negative    Rx Drugs  Negative      Personal History of Substance Abuse   Alcohol  Negative    Illegal Drugs  Negative    Rx Drugs  Negative      Age   Age between 19-45 years   No      History of Preadolescent Sexual Abuse   History of  Preadolescent Sexual Abuse  Negative or Female      Psychological Disease   Psychological Disease  Negative    Depression  Negative      Total Score   Opioid Risk Tool Scoring  0    Opioid Risk Interpretation  Low Risk      ORT Scoring interpretation table:  Score <3 = Low Risk for SUD  Score between 4-7 = Moderate Risk for SUD  Score >8 = High Risk for Opioid Abuse   PHQ-2 Depression Scale:  Total score: 0  PHQ-2 Scoring interpretation table: (Score and probability of major depressive disorder)  Score 0 = No depression  Score 1 = 15.4% Probability  Score 2 = 21.1% Probability  Score 3 = 38.4% Probability  Score 4 = 45.5% Probability  Score 5 = 56.4% Probability  Score 6 = 78.6% Probability   PHQ-9 Depression Scale:  Total score: 0  PHQ-9 Scoring interpretation table:  Score 0-4 = No depression  Score 5-9 = Mild depression  Score 10-14 = Moderate depression  Score 15-19 = Moderately severe depression  Score 20-27 = Severe depression (2.4 times higher risk of SUD and 2.89 times higher risk of overuse)   Pharmacologic Plan: As per protocol, I have not taken over any controlled substance management, pending the results of ordered tests and/or consults.            Initial impression: Pending review of available data and ordered tests.  Meds   Current Outpatient Medications:  .  Ascorbic Acid (VITAMIN C) 100 MG tablet, Take by mouth., Disp: , Rfl:  .  Blood Glucose Monitoring Suppl (CONTOUR NEXT EZ MONITOR) w/Device KIT, CHECK BLOOD SUGAR TWICE A DAY, Disp: , Rfl: 0 .  BREO ELLIPTA 100-25 MCG/INH AEPB, INHALE 1 PUFF BY MOUTH DAILY, Disp: , Rfl: 7 .  furosemide (LASIX) 20 MG tablet, Take 20 mg by mouth daily., Disp: , Rfl: 5 .  glucose blood (BAYER CONTOUR NEXT TEST) test strip, CHECK BLOOD SUGAR TWICE A DAY, Disp: , Rfl: 6 .  ibuprofen (ADVIL,MOTRIN) 200 MG tablet, Take 200 mg by mouth 6 (six) times daily., Disp: , Rfl:  .  KLOR-CON 10 10 MEQ tablet, Take 10 mEq by mouth  daily., Disp: , Rfl: 4 .  magnesium 30 MG tablet, Take 30 mg by mouth daily., Disp: , Rfl:  .  metFORMIN (GLUCOPHAGE) 500 MG tablet, Take 500 mg by mouth 2 (two) times daily., Disp: , Rfl: 12 .  MICROLET LANCETS MISC, CHECK BLOOD SUGAR TWICE A DAY, Disp: , Rfl: 6 .  mometasone (NASONEX) 50 MCG/ACT nasal spray, INSTILL 1 SPRAY NASALLY TWICE A DAY, Disp: , Rfl: 3 .  PROLENSA 0.07 % SOLN, INSTILL 1 DROP IN LEFT EYE AT BEDTIME, Disp: , Rfl: 1 .  timolol (TIMOPTIC) 0.5 % ophthalmic solution, , Disp: , Rfl: 3 .  gabapentin (NEURONTIN) 300 MG capsule, Take 2 capsules (600 mg total) by mouth at bedtime., Disp: 60 capsule, Rfl: 2  Imaging Review  Cervical Imaging:  Results for orders placed during the hospital encounter of 06/18/17  CT LUMBAR SPINE WO CONTRAST   Narrative CLINICAL DATA:  Progressive low back pain numbness extending into the lower extremities bilaterally. Symptoms for 3  months. Chronic pain due to trauma.  EXAM: CT LUMBAR SPINE WITHOUT CONTRAST  TECHNIQUE: Multidetector CT imaging of the lumbar spine was performed without intravenous contrast administration. Multiplanar CT image reconstructions were also generated.  COMPARISON:  Lumbar spine radiographs 06/07/2017  FINDINGS: Segmentation: 5 non rib-bearing lumbar type vertebral bodies are present. The lowest fully formed vertebral body is L5.  Alignment: Grade 1 anterolisthesis at L4-5 measures 7 mm. Slight retrolisthesis is present at L1-2, L2-3, and L3-4.  Vertebrae: Chronic endplate sclerotic changes are most evident at L1-2 on the right. Vertebral body heights are maintained. No acute or healing fractures are present.  Paraspinal and other soft tissues: Mild atherosclerotic changes are present in the distal aorta and proximal iliac vessels bilaterally. There is no aneurysm.  Disc levels: T12-L1: There is chronic loss of disc height. Endplate spurring and mild facet disease is present. No significant stenosis is  present.  L1-2: Chronic loss of disc height is present. Broad-based disc protrusion is present. Moderate facet hypertrophy is present bilaterally. This results an moderate subarticular and mild bilateral foraminal narrowing.  L2-3: Chronic loss of disc height is present. A broad-based disc protrusion is present. A vacuum disc is present moderate facet hypertrophy is noted bilaterally. Mild subarticular narrowing and moderate bilateral foraminal stenosis is present.  L3-4: A leftward disc protrusion is present. Facet hypertrophy is worse on the left. Mild left subarticular narrowing is present. Moderate foraminal stenosis is present bilaterally.  L4-5: Severe central canal stenosis is secondary to 7 mm anterolisthesis associated with severe facet hypertrophy resulting and uncovering of a broad-based disc protrusion. Moderate foraminal narrowing is evident bilaterally.  L5-S1: Moderate facet hypertrophy is present bilaterally. Disc bulges to the right without significant stenosis.  IMPRESSION: 1. Multilevel spondylosis of the lumbar spine as described. 2. Advanced facet hypertrophy with 7 mm anterolisthesis and severe central canal stenosis at L4-5, potentially impacting the L5 and S1 nerve roots bilaterally. 3. Moderate bilateral foraminal stenosis at L4-5. 4. Mild left subarticular narrowing at L3-4 with moderate bilateral foraminal stenosis. 5. Mild subarticular and moderate bilateral foraminal narrowing at L2-3. 6. Moderate subarticular and mild bilateral foraminal stenosis at L1-2   Electronically Signed   By: San Morelle M.D.   On: 06/18/2017 10:29     Lumbar DG (Complete) 4+V:  Results for orders placed during the hospital encounter of 06/07/17  DG Lumbar Spine Complete   Narrative CLINICAL DATA:  57 year old with low back pain radiating to both hips and legs. Pain for 6 weeks.  EXAM: LUMBAR SPINE - COMPLETE 4+ VIEW  COMPARISON:   None.  FINDINGS: Grade 1 anterolisthesis at L4-L5 measuring roughly 8 mm. There is mild retrolisthesis at L2-L3 and L3-L4. Disc space narrowing at L1-L2 and L2-L3. No evidence for a pars defect. Spondylolisthesis is likely related to facet disease. There is a left hip replacement. Mild curvature in the lumbar spine. Vertebral body heights are maintained without a fracture.  IMPRESSION: Multilevel degenerative disease throughout the lumbar spine with a combination of disc and facet disease.   Electronically Signed   By: Markus Daft M.D.   On: 06/08/2017 09:05     Results for orders placed during the hospital encounter of 06/07/17  DG HIP UNILAT WITH PELVIS 2-3 VIEWS RIGHT   Narrative CLINICAL DATA:  Right hip pain  EXAM: DG HIP (WITH OR WITHOUT PELVIS) 2-3V RIGHT  COMPARISON:  None.  FINDINGS: Mild joint space narrowing and spurring in the right hip. No fracture or AVN. No bony  lesion  IMPRESSION: Mild osteoarthritis right hip.   Electronically Signed   By: Franchot Gallo M.D.   On: 06/08/2017 09:05    Hip-L DG 2-3 views:  Results for orders placed during the hospital encounter of 06/07/17  DG HIP UNILAT WITH PELVIS 2-3 VIEWS LEFT   Narrative CLINICAL DATA:  Bilateral hip pain  EXAM: DG HIP (WITH OR WITHOUT PELVIS) 2-3V LEFT  COMPARISON:  None.  FINDINGS: Left hip replacement in satisfactory position and alignment. No hardware loosening or fracture.  IMPRESSION: Satisfactory left hip replacement.   Electronically Signed   By: Franchot Gallo M.D.   On: 06/08/2017 09:03    Foot Imaging: Foot-R DG Complete:  Results for orders placed in visit on 07/02/14  DG Foot Complete Right   Narrative 6 views 3 of the right foot and 3 left demonstrates osseously mature foot  severe osteoarthritic changes of Lisfranc's joints left foot. Plantar  distally oriented calcaneal heel spurs no other osseous abnormalities  identified.   Foot-L DG Complete:  Results  for orders placed in visit on 07/02/14  DG Foot Complete Left   Narrative These views have previously been transcribed    Complexity Note: Imaging results reviewed. Results shared with Courtney Henderson, using Layman's terms. Today I personally and independently reviewed the study images pertinent to Courtney Henderson's problem.            I have personally examined the images and I agree with the reported  findings. I find no additional pain-related pathology to add to the report.  ROS  Cardiovascular: No reported cardiovascular signs or symptoms such as High blood pressure, coronary artery disease, abnormal heart rate or rhythm, heart attack, blood thinner therapy or heart weakness and/or failure Pulmonary or Respiratory: Smoking Neurological: No reported neurological signs or symptoms such as seizures, abnormal skin sensations, urinary and/or fecal incontinence, being born with an abnormal open spine and/or a tethered spinal cord Review of Past Neurological Studies: No results found for this or any previous visit. Psychological-Psychiatric: No reported psychological or psychiatric signs or symptoms such as difficulty sleeping, anxiety, depression, delusions or hallucinations (schizophrenial), mood swings (bipolar disorders) or suicidal ideations or attempts Gastrointestinal: No reported gastrointestinal signs or symptoms such as vomiting or evacuating blood, reflux, heartburn, alternating episodes of diarrhea and constipation, inflamed or scarred liver, or pancreas or irrregular and/or infrequent bowel movements Genitourinary: No reported renal or genitourinary signs or symptoms such as difficulty voiding or producing urine, peeing blood, non-functioning kidney, kidney stones, difficulty emptying the bladder, difficulty controlling the flow of urine, or chronic kidney disease Hematological: No reported hematological signs or symptoms such as prolonged bleeding, low or poor functioning platelets, bruising or  bleeding easily, hereditary bleeding problems, low energy levels due to low hemoglobin or being anemic Endocrine: High blood sugar controlled without the use of insulin (NIDDM) Rheumatologic: No reported rheumatological signs and symptoms such as fatigue, joint pain, tenderness, swelling, redness, heat, stiffness, decreased range of motion, with or without associated rash Musculoskeletal: Negative for myasthenia gravis, muscular dystrophy, multiple sclerosis or malignant hyperthermia Work History: Working part time  Allergies  Courtney Henderson is allergic to morphine and related and penicillins.  Laboratory Chemistry  Inflammation Markers (CRP: Acute Phase) (ESR: Chronic Phase) No results found for: CRP, ESRSEDRATE, LATICACIDVEN                       Rheumatology Markers No results found for: RF, ANA, LABURIC, [place], [place], LYMEABIGMQN, HLAB27  Renal Function Markers No results found for: BUN, CREATININE, BCR, GFRAA, GFRNONAA                           Hepatic Function Markers No results found for: AST, ALT, ALBUMIN, ALKPHOS, HCVAB, AMYLASE, LIPASE, AMMONIA                      Electrolytes No results found for: NA, K, CL, CALCIUM, MG, PHOS                      Neuropathy Markers No results found for: VITAMINB12, FOLATE, HGBA1C, HIV                      Bone Pathology Markers No results found for: VD25OH, [id number], [id number], [id number], 25OHVITD1, 25OHVITD2, 25OHVITD3, TESTOFREE, TESTOSTERONE                       Coagulation Parameters No results found for: INR, LABPROT, APTT, PLT, DDIMER                      Cardiovascular Markers No results found for: BNP, CKTOTAL, CKMB, TROPONINI, HGB, HCT                       CA Markers No results found for: CEA, CA125, LABCA2                      Note: Lab results reviewed.  PFSH  Drug: Courtney Henderson  has no drug history on file. Alcohol:  has no alcohol history on file. Tobacco:  reports that she has been  smoking. She has never used smokeless tobacco. Medical:  has no past medical history on file. Family: family history is not on file.  No past surgical history on file. Active Ambulatory Problems    Diagnosis Date Noted  . Lumbosacral spondylosis with radiculopathy 07/13/2017  . Polyarthralgia 07/13/2017  . Diabetic polyneuropathy associated with type 2 diabetes mellitus ([place]) 07/13/2017   Resolved Ambulatory Problems    Diagnosis Date Noted  . No Resolved Ambulatory Problems   No Additional Past Medical History   Constitutional Exam  General appearance: Well nourished, well developed, and well hydrated. In no apparent acute distress Vitals:   09/07/17 0917  BP: (!) 152/82  Pulse: 84  Resp: 16  Temp: 98.7 F (37.1 C)  TempSrc: Oral  SpO2: 97%  Weight: 222 lb (100.7 kg)  Height: 5' 2" (1.575 m)   BMI Assessment: Estimated body mass index is 40.6 kg/m as calculated from the following:   Height as of this encounter: 5' 2" (1.575 m).   Weight as of this encounter: 222 lb (100.7 kg).  BMI interpretation table: BMI level Category Range association with higher incidence of chronic pain  <18 kg/m2 Underweight   18.5-24.9 kg/m2 Ideal body weight   25-29.9 kg/m2 Overweight Increased incidence by 20%  30-34.9 kg/m2 Obese (Class I) Increased incidence by 68%  35-39.9 kg/m2 Severe obesity (Class II) Increased incidence by 136%  >40 kg/m2 Extreme obesity (Class III) Increased incidence by 254%   Patient's current BMI Ideal Body weight  Body mass index is 40.6 kg/m. Ideal body weight: 50.1 kg (110 lb 7.2 oz) Adjusted ideal body weight: 70.3 kg (155 lb 1.1 oz)   BMI Readings from Last 4 Encounters:  09/07/17 40.60 kg/m  07/02/14 38.28 kg/m   Wt Readings from Last 4 Encounters:  09/07/17 222 lb (100.7 kg)  07/02/14 223 lb (101.2 kg)  Psych/Mental status: Alert, oriented x 3 (person, place, & time)       Eyes: PERLA Respiratory: No evidence of acute respiratory  distress  Cervical Spine Area Exam  Skin & Axial Inspection: No masses, redness, edema, swelling, or associated skin lesions Alignment: Symmetrical Functional ROM: Unrestricted ROM      Stability: No instability detected Muscle Tone/Strength: Functionally intact. No obvious neuro-muscular anomalies detected. Sensory (Neurological): Unimpaired Palpation: No palpable anomalies              Upper Extremity (UE) Exam    Side: Right upper extremity  Side: Left upper extremity  Skin & Extremity Inspection: Skin color, temperature, and hair growth are WNL. No peripheral edema or cyanosis. No masses, redness, swelling, asymmetry, or associated skin lesions. No contractures.  Skin & Extremity Inspection: Skin color, temperature, and hair growth are WNL. No peripheral edema or cyanosis. No masses, redness, swelling, asymmetry, or associated skin lesions. No contractures.  Functional ROM: Unrestricted ROM          Functional ROM: Unrestricted ROM          Muscle Tone/Strength: Functionally intact. No obvious neuro-muscular anomalies detected.  Muscle Tone/Strength: Functionally intact. No obvious neuro-muscular anomalies detected.  Sensory (Neurological): Unimpaired          Sensory (Neurological): Unimpaired          Palpation: No palpable anomalies              Palpation: No palpable anomalies              Provocative Test(s):  Phalen's test: deferred Tinel's test: deferred Apley's scratch test (touch opposite shoulder):  Action 1 (Across chest): deferred Action 2 (Overhead): deferred Action 3 (LB reach): deferred   Provocative Test(s):  Phalen's test: deferred Tinel's test: deferred Apley's scratch test (touch opposite shoulder):  Action 1 (Across chest): deferred Action 2 (Overhead): deferred Action 3 (LB reach): deferred    Thoracic Spine Area Exam  Skin & Axial Inspection: No masses, redness, or swelling Alignment: Symmetrical Functional ROM: Unrestricted ROM Stability: No  instability detected Muscle Tone/Strength: Functionally intact. No obvious neuro-muscular anomalies detected. Sensory (Neurological): Unimpaired Muscle strength & Tone: No palpable anomalies  Lumbar Spine Area Exam  Skin & Axial Inspection: No masses, redness, or swelling Alignment: Symmetrical Functional ROM: Decreased ROM affecting both sides left greater than right Stability: No instability detected Muscle Tone/Strength: Functionally intact. No obvious neuro-muscular anomalies detected. Sensory (Neurological): Dermatomal pain pattern and muscular skeletal Palpation: No palpable anomalies       Provocative Tests: Hyperextension/rotation test: (+) due to pain. Lumbar quadrant test (Kemp's test): (+) bilateral for foraminal stenosis letter than right Lateral bending test: deferred today       Patrick's Maneuver: deferred today                   FABER test: deferred today                   S-I anterior distraction/compression test: deferred today         S-I lateral compression test: deferred today         S-I Thigh-thrust test: deferred today         S-I Gaenslen's test: deferred today          Gait & Posture Assessment  Ambulation: Unassisted Gait: Relatively  normal for age and body habitus Posture: WNL   Lower Extremity Exam    Side: Right lower extremity  Side: Left lower extremity  Stability: No instability observed          Stability: No instability observed          Skin & Extremity Inspection: Skin color, temperature, and hair growth are WNL. No peripheral edema or cyanosis. No masses, redness, swelling, asymmetry, or associated skin lesions. No contractures.  Skin & Extremity Inspection: Skin color, temperature, and hair growth are WNL. No peripheral edema or cyanosis. No masses, redness, swelling, asymmetry, or associated skin lesions. No contractures.  Functional ROM: Unrestricted ROM                  Functional ROM: Unrestricted ROM                  Muscle  Tone/Strength: Functionally intact. No obvious neuro-muscular anomalies detected.  Muscle Tone/Strength: Functionally intact. No obvious neuro-muscular anomalies detected.  Sensory (Neurological): Dermatomal pain pattern  Sensory (Neurological): Dermatomal pain pattern  Palpation: No palpable anomalies  Palpation: No palpable anomalies   Assessment  Primary Diagnosis & Pertinent Problem List: The primary encounter diagnosis was Lumbar radiculopathy. Diagnoses of Lumbar foraminal stenosis, Lumbosacral spondylosis with radiculopathy, Polyarthralgia, Diabetic polyneuropathy associated with type 2 diabetes mellitus ([place]), Chronic pain syndrome, and History of left hip replacement were also pertinent to this visit.  Visit Diagnosis (New problems to examiner): 1. Lumbar radiculopathy   2. Lumbar foraminal stenosis   3. Lumbosacral spondylosis with radiculopathy   4. Polyarthralgia   5. Diabetic polyneuropathy associated with type 2 diabetes mellitus ([place])   6. Chronic pain syndrome   7. History of left hip replacement     General Recommendations: The pain condition that the patient suffers from is best treated with a multidisciplinary approach that involves an increase in physical activity to prevent de-conditioning and worsening of the pain cycle, as well as psychological counseling (formal and/or informal) to address the co-morbid psychological affects of pain. Treatment will often involve judicious use of pain medications and interventional procedures to decrease the pain, allowing the patient to participate in the physical activity that will ultimately produce long-lasting pain reductions. The goal of the multidisciplinary approach is to return the patient to a higher level of overall function and to restore their ability to perform activities of daily living.  57 year old female with a history of type 2 diabetes, non-insulin-dependent, who presents with axial low back pain that radiates down  bilateral legs stopping above her knees usually.  She states that the left leg is worse in the right leg.  She describes her pain is primarily appendicular rather than axial.  Patient does have pain with facet loading and lateral rotation also suggesting a facetogenic component to her low back pain.  Patient's CT of her lumbar spine reveals moderate to severe bilateral foraminal stenosis at L4-L5 along with mild left subarticular narrowing at L3-L4 with moderate bilateral foraminal stenosis at 3 4 as well.  Patient also has multilevel spondylosis and lumbar degenerative disc disease.  Of note patient does have advanced facet hypertrophy along with 7 mm anterolisthesis at L4-L5 which could be impacting the L5 and S1 nerve roots bilaterally.  In regards to treatment plan, we discussed lumbar epidural steroid injection at L4-L5 or L5-S1.  Risks and benefits were discussed.  Patient denies being on any blood thinners.  Patient would like to proceed.  Plan: -Left L4-L5 ESI for  symptoms of lumbar radiculopathy -Increase gabapentin to 600 mg nightly. -UDS today  Plan of Care (Initial workup plan)  Note: Please be advised that as per protocol, today's visit has been an evaluation only. We have not taken over the patient's controlled substance management.  Problem-specific plan: No problem-specific Assessment & Plan notes found for this encounter.  Ordered Lab-work, Procedure(s), Referral(s), & Consult(s): Orders Placed This Encounter  Procedures  . Lumbar Epidural Injection  . Compliance Drug Analysis, Ur   Pharmacotherapy (current): Medications ordered:  Meds ordered this encounter  Medications  . gabapentin (NEURONTIN) 300 MG capsule    Sig: Take 2 capsules (600 mg total) by mouth at bedtime.    Dispense:  60 capsule    Refill:  2   Medications administered during this visit: Tobie Lords. [place] had no medications administered during this visit.   Pharmacological management options:  Opioid  Analgesics: The patient was informed that there is no guarantee that she would be a candidate for opioid analgesics. The decision will be made following CDC guidelines. This decision will be based on the results of diagnostic studies, as well as Courtney Henderson's risk profile.   Membrane stabilizer: To be determined at a later time  Muscle relaxant: To be determined at a later time  NSAID: To be determined at a later time  Other analgesic(s): To be determined at a later time   Interventional management options: Courtney Henderson was informed that there is no guarantee that she would be a candidate for interventional therapies. The decision will be based on the results of diagnostic studies, as well as Courtney Henderson's risk profile.  Procedure(s) under consideration:  Bilateral lumbar facet medial branch nerve blocks Bilateral SI joint injection Spinal cord stimulator trial   Provider-requested follow-up: Return in about 3 weeks (around 09/28/2017) for Procedure.  Future Appointments  Date Time Provider [place]  09/22/2017  9:15 AM Garrel Ridgel, [place] [facility]  10/04/2017  9:00 AM Gillis Santa, MD [facility] None    Primary Care Physician: Cletis Athens, MD Location: [facility] Note by: Gillis Santa, M.D, Date: 09/07/2017; Time: 2:12 PM  Patient Instructions  __Pick up Gabapentin Rx at the pharmacy. __________________________________________________________________________________________  Preparing for Procedure with Sedation  Instructions: . Oral Intake: Do not eat or drink anything for at least 8 hours prior to your procedure. . Transportation: Public transportation is not allowed. Bring an adult driver. The driver must be physically present in our waiting room before any procedure can be started. Marland Kitchen Physical Assistance: Bring an adult physically capable of assisting you, in the event you need help. This adult should keep you company at home for at  least 6 hours after the procedure. . Blood Pressure Medicine: Take your blood pressure medicine with a sip of water the morning of the procedure. . Blood thinners: Notify our staff if you are taking any blood thinners. Depending on which one you take, there will be specific instructions on how and when to stop it. . Diabetics on insulin: Notify the staff so that you can be scheduled 1st case in the morning. If your diabetes requires high dose insulin, take only  of your normal insulin dose the morning of the procedure and notify the staff that you have done so. . Preventing infections: Shower with an antibacterial soap the morning of your procedure. . Build-up your immune system: Take 1000 mg of Vitamin C with every meal (3 times a day) the day prior to your  procedure. Marland Kitchen Antibiotics: Inform the staff if you have a condition or reason that requires you to take antibiotics before dental procedures. . Pregnancy: If you are pregnant, call and cancel the procedure. . Sickness: If you have a cold, fever, or any active infections, call and cancel the procedure. . Arrival: You must be in the facility at least 30 minutes prior to your scheduled procedure. . Children: Do not bring children with you. . Dress appropriately: Bring dark clothing that you would not mind if they get stained. . Valuables: Do not bring any jewelry or valuables.  Procedure appointments are reserved for interventional treatments only. Marland Kitchen No Prescription Refills. . No medication changes will be discussed during procedure appointments. . No disability issues will be discussed.  Reasons to call and reschedule or cancel your procedure: (Following these recommendations will minimize the risk of a serious complication.) . Surgeries: Avoid having procedures within 2 weeks of any surgery. (Avoid for 2 weeks before or after any surgery). . Flu Shots: Avoid having procedures within 2 weeks of a flu shots or . (Avoid for 2 weeks before or  after immunizations). . Barium: Avoid having a procedure within 7-10 days after having had a radiological study involving the use of radiological contrast. (Myelograms, Barium swallow or enema study). . Heart attacks: Avoid any elective procedures or surgeries for the initial 6 months after a "Myocardial Infarction" (Heart Attack). . Blood thinners: It is imperative that you stop these medications before procedures. Let us know if you if you take any blood thinner.  . Infection: Avoid procedures during or within two weeks of an infection (including chest colds or gastrointestinal problems). Symptoms associated with infections include: Localized redness, fever, chills, night sweats or profuse sweating, burning sensation when voiding, cough, congestion, stuffiness, runny nose, sore throat, diarrhea, nausea, vomiting, cold or Flu symptoms, recent or current infections. It is specially important if the infection is over the area that we intend to treat. Marland Kitchen Heart and lung problems: Symptoms that may suggest an active cardiopulmonary problem include: cough, chest pain, breathing difficulties or shortness of breath, dizziness, ankle swelling, uncontrolled high or unusually low blood pressure, and/or palpitations. If you are experiencing any of these symptoms, cancel your procedure and contact your primary care physician for an evaluation.  Remember:  Regular Business hours are:  Monday to Thursday 8:00 AM to 4:00 PM  Provider's Schedule: Milinda Pointer, MD:  Procedure days: Tuesday and Thursday 7:30 AM to 4:00 PM  Gillis Santa, MD:  Procedure days: Monday and Wednesday 7:30 AM to 4:00 PM ____________________________________________________________________________________________     Epidural Steroid Injection Patient Information  Description: The epidural space surrounds the nerves as they exit the spinal cord.  In some patients, the nerves can be compressed and inflamed by a bulging disc or a  tight spinal canal (spinal stenosis).  By injecting steroids into the epidural space, we can bring irritated nerves into direct contact with a potentially helpful medication.  These steroids act directly on the irritated nerves and can reduce swelling and inflammation which often leads to decreased pain.  Epidural steroids may be injected anywhere along the spine and from the neck to the low back depending upon the location of your pain.   After numbing the skin with local anesthetic (like Novocaine), a small needle is passed into the epidural space slowly.  You may experience a sensation of pressure while this is being done.  The entire block usually last less than 10 minutes.  Conditions which  may be treated by epidural steroids:   Low back and leg pain  Neck and arm pain  Spinal stenosis  Post-laminectomy syndrome  Herpes zoster (shingles) pain  Pain from compression fractures  Preparation for the injection:  1. Do not eat any solid food or dairy products within 8 hours of your appointment.  2. You may drink clear liquids up to 3 hours before appointment.  Clear liquids include water, black coffee, juice or soda.  No milk or cream please. 3. You may take your regular medication, including pain medications, with a sip of water before your appointment  Diabetics should hold regular insulin (if taken separately) and take 1/2 normal NPH dos the morning of the procedure.  Carry some sugar containing items with you to your appointment. 4. A driver must accompany you and be prepared to drive you home after your procedure.  5. Bring all your current medications with your. 6. An IV may be inserted and sedation may be given at the discretion of the physician.   7. A blood pressure cuff, EKG and other monitors will often be applied during the procedure.  Some patients may need to have extra oxygen administered for a short period. 8. You will be asked to provide medical information, including your  allergies, prior to the procedure.  We must know immediately if you are taking blood thinners (like Coumadin/Warfarin)  Or if you are allergic to IV iodine contrast (dye). We must know if you could possible be pregnant.  Possible side-effects:  Bleeding from needle site  Infection (rare, may require surgery)  Nerve injury (rare)  Numbness & tingling (temporary)  Difficulty urinating (rare, temporary)  Spinal headache ( a headache worse with upright posture)  Light -headedness (temporary)  Pain at injection site (several days)  Decreased blood pressure (temporary)  Weakness in arm/leg (temporary)  Pressure sensation in back/neck (temporary)  Call if you experience:  Fever/chills associated with headache or increased back/neck pain.  Headache worsened by an upright position.  New onset weakness or numbness of an extremity below the injection site  Hives or difficulty breathing (go to the emergency room)  Inflammation or drainage at the infection site  Severe back/neck pain  Any new symptoms which are concerning to you  Please note:  Although the local anesthetic injected can often make your back or neck feel good for several hours after the injection, the pain will likely return.  It takes 3-7 days for steroids to work in the epidural space.  You may not notice any pain relief for at least that one week.  If effective, we will often do a series of three injections spaced 3-6 weeks apart to maximally decrease your pain.  After the initial series, we generally will wait several months before considering a repeat injection of the same type.  If you have any questions, please call [phone] [facility]

## 2017-09-07 NOTE — Progress Notes (Signed)
Safety precautions to be maintained throughout the outpatient stay will include: orient to surroundings, keep bed in low position, maintain call bell within reach at all times, provide assistance with transfer out of bed and ambulation.  

## 2017-09-12 LAB — COMPLIANCE DRUG ANALYSIS, UR

## 2017-09-22 ENCOUNTER — Ambulatory Visit: Payer: BLUE CROSS/BLUE SHIELD | Admitting: Podiatry

## 2017-09-22 ENCOUNTER — Encounter: Payer: Self-pay | Admitting: Podiatry

## 2017-09-22 DIAGNOSIS — M722 Plantar fascial fibromatosis: Secondary | ICD-10-CM

## 2017-09-22 NOTE — Progress Notes (Signed)
She presents today for follow-up of her bilateral heels.  States they are exquisitely tender.  Objective: Vital signs are stable alert and oriented x3.  Pulses are palpable.  She has pain on palpation medial calcaneal tubercles bilateral.  No open lesions or wounds.  Assessment: Plantar fasciitis bilateral.  Plan: I injected her today with 10 mg of Kenalog and 5 mg Marcaine and sterile Betadine skin prep bilaterally.  Tolerated procedure well without comp occasions.  Follow-up with her as needed.

## 2017-10-04 ENCOUNTER — Ambulatory Visit
Admission: RE | Admit: 2017-10-04 | Discharge: 2017-10-04 | Disposition: A | Discharge status: Home / Self Care | Payer: BLUE CROSS/BLUE SHIELD | Source: Ambulatory Visit | Attending: Student in an Organized Health Care Education/Training Program | Admitting: Student in an Organized Health Care Education/Training Program

## 2017-10-04 ENCOUNTER — Other Ambulatory Visit: Payer: Self-pay

## 2017-10-04 ENCOUNTER — Encounter: Payer: Self-pay | Admitting: Student in an Organized Health Care Education/Training Program

## 2017-10-04 ENCOUNTER — Ambulatory Visit (HOSPITAL_BASED_OUTPATIENT_CLINIC_OR_DEPARTMENT_OTHER): Payer: BLUE CROSS/BLUE SHIELD | Admitting: Student in an Organized Health Care Education/Training Program

## 2017-10-04 VITALS — BP 109/80 | HR 80 | Temp 96.8°F | Resp 19 | Ht 63.0 in | Wt 222.0 lb

## 2017-10-04 DIAGNOSIS — M9983 Other biomechanical lesions of lumbar region: Secondary | ICD-10-CM

## 2017-10-04 DIAGNOSIS — M545 Low back pain: Secondary | ICD-10-CM | POA: Diagnosis not present

## 2017-10-04 DIAGNOSIS — M48061 Spinal stenosis, lumbar region without neurogenic claudication: Secondary | ICD-10-CM | POA: Diagnosis not present

## 2017-10-04 DIAGNOSIS — Z7984 Long term (current) use of oral hypoglycemic drugs: Secondary | ICD-10-CM | POA: Diagnosis not present

## 2017-10-04 DIAGNOSIS — Z79899 Other long term (current) drug therapy: Secondary | ICD-10-CM | POA: Insufficient documentation

## 2017-10-04 DIAGNOSIS — M79604 Pain in right leg: Secondary | ICD-10-CM | POA: Diagnosis not present

## 2017-10-04 DIAGNOSIS — Z880 Allergy status to penicillin: Secondary | ICD-10-CM | POA: Insufficient documentation

## 2017-10-04 DIAGNOSIS — Z885 Allergy status to narcotic agent status: Secondary | ICD-10-CM | POA: Insufficient documentation

## 2017-10-04 DIAGNOSIS — M79605 Pain in left leg: Secondary | ICD-10-CM | POA: Insufficient documentation

## 2017-10-04 DIAGNOSIS — M5416 Radiculopathy, lumbar region: Secondary | ICD-10-CM | POA: Diagnosis not present

## 2017-10-04 MED ORDER — LIDOCAINE HCL 2 % IJ SOLN
10.0000 mL | Freq: Once | INTRAMUSCULAR | Status: AC
  Administered 2017-10-04: 200 mg

## 2017-10-04 MED ORDER — SODIUM CHLORIDE 0.9 % IJ SOLN
INTRAMUSCULAR | Status: AC
  Filled 2017-10-04: qty 10

## 2017-10-04 MED ORDER — DEXAMETHASONE SODIUM PHOSPHATE 10 MG/ML IJ SOLN
10.0000 mg | Freq: Once | INTRAMUSCULAR | Status: AC
  Administered 2017-10-04: 10 mg

## 2017-10-04 MED ORDER — SODIUM CHLORIDE 0.9% FLUSH
2.0000 mL | Freq: Once | INTRAVENOUS | Status: AC
  Administered 2017-10-04: 2 mL

## 2017-10-04 MED ORDER — LACTATED RINGERS IV SOLN
1000.0000 mL | Freq: Once | INTRAVENOUS | Status: AC
  Administered 2017-10-04: 1000 mL via INTRAVENOUS

## 2017-10-04 MED ORDER — FENTANYL CITRATE (PF) 100 MCG/2ML IJ SOLN
25.0000 ug | INTRAMUSCULAR | Status: DC | PRN
  Administered 2017-10-04: 50 ug via INTRAVENOUS
  Filled 2017-10-04: qty 2

## 2017-10-04 MED ORDER — ROPIVACAINE HCL 2 MG/ML IJ SOLN
INTRAMUSCULAR | Status: AC
  Filled 2017-10-04: qty 10

## 2017-10-04 MED ORDER — IOPAMIDOL (ISOVUE-M 200) INJECTION 41%
INTRAMUSCULAR | Status: AC
  Filled 2017-10-04: qty 10

## 2017-10-04 MED ORDER — DEXAMETHASONE SODIUM PHOSPHATE 10 MG/ML IJ SOLN
INTRAMUSCULAR | Status: AC
  Filled 2017-10-04: qty 1

## 2017-10-04 MED ORDER — ROPIVACAINE HCL 2 MG/ML IJ SOLN
2.0000 mL | Freq: Once | INTRAMUSCULAR | Status: AC
  Administered 2017-10-04: 2 mL via EPIDURAL

## 2017-10-04 MED ORDER — LIDOCAINE HCL 2 % IJ SOLN
INTRAMUSCULAR | Status: AC
  Filled 2017-10-04: qty 20

## 2017-10-04 MED ORDER — IOPAMIDOL (ISOVUE-M 200) INJECTION 41%
10.0000 mL | Freq: Once | INTRAMUSCULAR | Status: AC
  Administered 2017-10-04: 10 mL via EPIDURAL

## 2017-10-04 NOTE — Progress Notes (Signed)
Safety precautions to be maintained throughout the outpatient stay will include: orient to surroundings, keep bed in low position, maintain call bell within reach at all times, provide assistance with transfer out of bed and ambulation.  

## 2017-10-04 NOTE — Progress Notes (Signed)
Patient's Name: Courtney Henderson  MRN: [id number]  Referring Provider: Corky Downs, MD  DOB: July 29, 1960  PCP: Corky Downs, MD  DOS: 10/04/2017  Note by: Edward Jolly, MD  Service setting: Ambulatory outpatient  Specialty: Interventional Pain Management  Patient type: Established  Location: [facility] (AMB) Pain Management Facility  Visit type: Interventional Procedure   Primary Reason for Visit: Interventional Pain Management Treatment. CC: Back Pain (lower) and Leg Pain (bilaterally, left is worse)  Procedure:          Anesthesia, Analgesia, Anxiolysis:  Type: Therapeutic Inter-Laminar Epidural Steroid Injection  #1  Region: Lumbar Level: L4-5 Level. Laterality: Midline slightly to LEFT         Type: Moderate (Conscious) Sedation combined with Local Anesthesia Indication(s): Analgesia and Anxiety Route: Intravenous (IV) IV Access: Secured Sedation: Meaningful verbal contact was maintained at all times during the procedure  Local Anesthetic: Lidocaine 1-2%   Indications: 1. Lumbar radiculopathy   2. Lumbar foraminal stenosis    Pain Score: Pre-procedure: 8 /10 Post-procedure: 0-No pain/10  Pre-op Assessment:  Courtney Henderson is a 57 y.o. (year old), female patient, seen today for interventional treatment. She  has no past surgical history on file. Courtney Henderson has a current medication list which includes the following prescription(s): vitamin c, contour next ez monitor, breo ellipta, furosemide, gabapentin, glucose blood, ibuprofen, klor-con 10, magnesium, metformin, microlet lancets, mometasone, prolensa, and timolol, and the following Facility-Administered Medications: fentanyl. Her primarily concern today is the Back Pain (lower) and Leg Pain (bilaterally, left is worse)  Initial Vital Signs:  Pulse/HCG Rate: 82ECG Heart Rate: 77 Temp: 98.4 F (36.9 C) Resp: 18 BP: (!) 142/92 SpO2: 98 %  BMI: Estimated body mass index is 39.33 kg/m as calculated from the following:   Height as of this  encounter: 5\' 3"  (1.6 m).   Weight as of this encounter: 222 lb (100.7 kg).  Risk Assessment: Allergies: Reviewed. She is allergic to morphine and related and penicillins.  Allergy Precautions: None required Coagulopathies: Reviewed. None identified.  Blood-thinner therapy: None at this time Active Infection(s): Reviewed. None identified. Courtney Henderson is afebrile  Site Confirmation: Courtney Henderson was asked to confirm the procedure and laterality before marking the site Procedure checklist: Completed Consent: Before the procedure and under the influence of no sedative(s), amnesic(s), or anxiolytics, the patient was informed of the treatment options, risks and possible complications. To fulfill our ethical and legal obligations, as recommended by the American Medical Association's Code of Ethics, I have informed the patient of my clinical impression; the nature and purpose of the treatment or procedure; the risks, benefits, and possible complications of the intervention; the alternatives, including doing nothing; the risk(s) and benefit(s) of the alternative treatment(s) or procedure(s); and the risk(s) and benefit(s) of doing nothing. The patient was provided information about the general risks and possible complications associated with the procedure. These may include, but are not limited to: failure to achieve desired goals, infection, bleeding, organ or nerve damage, allergic reactions, paralysis, and death. In addition, the patient was informed of those risks and complications associated to Spine-related procedures, such as failure to decrease pain; infection (i.e.: Meningitis, epidural or intraspinal abscess); bleeding (i.e.: epidural hematoma, subarachnoid hemorrhage, or any other type of intraspinal or peri-dural bleeding); organ or nerve damage (i.e.: Any type of peripheral nerve, nerve root, or spinal cord injury) with subsequent damage to sensory, motor, and/or autonomic systems, resulting in  permanent pain, numbness, and/or weakness of one or several areas of the body;  allergic reactions; (i.e.: anaphylactic reaction); and/or death. Furthermore, the patient was informed of those risks and complications associated with the medications. These include, but are not limited to: allergic reactions (i.e.: anaphylactic or anaphylactoid reaction(s)); adrenal axis suppression; blood sugar elevation that in diabetics may result in ketoacidosis or comma; water retention that in patients with history of congestive heart failure may result in shortness of breath, pulmonary edema, and decompensation with resultant heart failure; weight gain; swelling or edema; medication-induced neural toxicity; particulate matter embolism and blood vessel occlusion with resultant organ, and/or nervous system infarction; and/or aseptic necrosis of one or more joints. Finally, the patient was informed that Medicine is not an exact science; therefore, there is also the possibility of unforeseen or unpredictable risks and/or possible complications that may result in a catastrophic outcome. The patient indicated having understood very clearly. We have given the patient no guarantees and we have made no promises. Enough time was given to the patient to ask questions, all of which were answered to the patient's satisfaction. Courtney Henderson has indicated that she wanted to continue with the procedure. Attestation: I, the ordering provider, attest that I have discussed with the patient the benefits, risks, side-effects, alternatives, likelihood of achieving goals, and potential problems during recovery for the procedure that I have provided informed consent. Date  Time: 10/04/2017  8:49 AM  Pre-Procedure Preparation:  Monitoring: As per clinic protocol. Respiration, ETCO2, SpO2, BP, heart rate and rhythm monitor placed and checked for adequate function Safety Precautions: Patient was assessed for positional comfort and pressure points  before starting the procedure. Time-out: I initiated and conducted the "Time-out" before starting the procedure, as per protocol. The patient was asked to participate by confirming the accuracy of the "Time Out" information. Verification of the correct person, site, and procedure were performed and confirmed by me, the nursing staff, and the patient. "Time-out" conducted as per Joint Commission's Universal Protocol (UP.01.01.01). Time: 0937  Description of Procedure:          Position: Prone with head of the table was raised to facilitate breathing. Target Area: The interlaminar space, initially targeting the lower laminar border of the superior vertebral body. Approach: Paramedial approach. Area Prepped: Entire Posterior Lumbar Region Prepping solution: ChloraPrep (2% chlorhexidine gluconate and 70% isopropyl alcohol) Safety Precautions: Aspiration looking for blood return was conducted prior to all injections. At no point did we inject any substances, as a needle was being advanced. No attempts were made at seeking any paresthesias. Safe injection practices and needle disposal techniques used. Medications properly checked for expiration dates. SDV (single dose vial) medications used. Description of the Procedure: Protocol guidelines were followed. The procedure needle was introduced through the skin, ipsilateral to the reported pain, and advanced to the target area. Bone was contacted and the needle walked caudad, until the lamina was cleared. The epidural space was identified using "loss-of-resistance technique" with 2-3 ml of PF-NaCl (0.9% NSS), in a 5cc LOR glass syringe. Vitals:   10/04/17 0945 10/04/17 0955 10/04/17 1005 10/04/17 1015  BP: 138/84 132/67 (!) 124/57 109/80  Pulse: 80     Resp: 20 18 (!) 21 19  Temp:  (!) 96.9 F (36.1 C)  (!) 96.8 F (36 C)  TempSrc:      SpO2: 95% 94% 94% 94%  Weight:      Height:        Start Time: 0937 hrs. End Time: 0944 hrs. Materials:   Needle(s) Type: Epidural needle Gauge: 17G Length: 3.5-in Medication(s):  Please see orders for medications and dosing details. 9 CC solution consisting of 6 cc of preservative-free saline, 2 cc of 0.2% ropivacaine, 1 cc of Decadron 10 mg/cc Imaging Guidance (Spinal):          Type of Imaging Technique: Fluoroscopy Guidance (Spinal) Indication(s): Assistance in needle guidance and placement for procedures requiring needle placement in or near specific anatomical locations not easily accessible without such assistance. Exposure Time: Please see nurses notes. Contrast: Before injecting any contrast, we confirmed that the patient did not have an allergy to iodine, shellfish, or radiological contrast. Once satisfactory needle placement was completed at the desired level, radiological contrast was injected. Contrast injected under live fluoroscopy. No contrast complications. See chart for type and volume of contrast used. Fluoroscopic Guidance: I was personally present during the use of fluoroscopy. "Tunnel Vision Technique" used to obtain the best possible view of the target area. Parallax error corrected before commencing the procedure. "Direction-depth-direction" technique used to introduce the needle under continuous pulsed fluoroscopy. Once target was reached, antero-posterior, oblique, and lateral fluoroscopic projection used confirm needle placement in all planes. Images permanently stored in EMR. Interpretation: I personally interpreted the imaging intraoperatively. Adequate needle placement confirmed in multiple planes. Appropriate spread of contrast into desired area was observed. No evidence of afferent or efferent intravascular uptake. No intrathecal or subarachnoid spread observed. Permanent images saved into the patient's record.  Antibiotic Prophylaxis:   Anti-infectives (From admission, onward)   None     Indication(s): None identified  Post-operative Assessment:  Post-procedure  Vital Signs:  Pulse/HCG Rate: 8076 Temp: (!) 96.8 F (36 C) Resp: 19 BP: 109/80 SpO2: 94 %  EBL: None  Complications: No immediate post-treatment complications observed by team, or reported by patient.  Note: The patient tolerated the entire procedure well. A repeat set of vitals were taken after the procedure and the patient was kept under observation following institutional policy, for this type of procedure. Post-procedural neurological assessment was performed, showing return to baseline, prior to discharge. The patient was provided with post-procedure discharge instructions, including a section on how to identify potential problems. Should any problems arise concerning this procedure, the patient was given instructions to immediately contact us, at any time, without hesitation. In any case, we plan to contact the patient by telephone for a follow-up status report regarding this interventional procedure.  Comments:  No additional relevant information. 5 out of 5 strength bilateral lower extremity: Plantar flexion, dorsiflexion, knee flexion, knee extension.  Plan of Care    Imaging Orders     DG C-Arm 1-60 Min-No Report Procedure Orders    No procedure(s) ordered today    Medications ordered for procedure: Meds ordered this encounter  Medications  . lactated ringers infusion 1,000 mL  . fentaNYL (SUBLIMAZE) injection 25-100 mcg    Make sure Narcan is available in the pyxis when using this medication. In the event of respiratory depression (RR< 8/min): Titrate NARCAN (naloxone) in increments of 0.1 to 0.2 mg IV at 2-3 minute intervals, until desired degree of reversal.  . iopamidol (ISOVUE-M) 41 % intrathecal injection 10 mL  . ropivacaine (PF) 2 mg/mL (0.2%) (NAROPIN) injection 2 mL  . sodium chloride flush (NS) 0.9 % injection 2 mL  . lidocaine (XYLOCAINE) 2 % (with pres) injection 200 mg  . dexamethasone (DECADRON) injection 10 mg   Medications administered: We  administered lactated ringers, fentaNYL, iopamidol, ropivacaine (PF) 2 mg/mL (0.2%), sodium chloride flush, lidocaine, and dexamethasone.  See the medical record for  exact dosing, route, and time of administration.  New Prescriptions   No medications on file   Disposition: Discharge home  Discharge Date & Time: 10/04/2017; 1016 hrs.   Physician-requested Follow-up: Return in about 5 weeks (around 11/08/2017) for Post Procedure Evaluation.  Future Appointments  Date Time Provider Department Center  11/09/2017  9:45 AM Edward Jolly, MD [facility] None   Primary Care Physician: Corky Downs, MD Location: [facility] Note by: Edward Jolly, MD Date: 10/04/2017; Time: 1:27 PM  Disclaimer:  Medicine is not an exact science. The only guarantee in medicine is that nothing is guaranteed. It is important to note that the decision to proceed with this intervention was based on the information collected from the patient. The Data and conclusions were drawn from the patient's questionnaire, the interview, and the physical examination. Because the information was provided in large part by the patient, it cannot be guaranteed that it has not been purposely or unconsciously manipulated. Every effort has been made to obtain as much relevant data as possible for this evaluation. It is important to note that the conclusions that lead to this procedure are derived in large part from the available data. Always take into account that the treatment will also be dependent on availability of resources and existing treatment guidelines, considered by other Pain Management Practitioners as being common knowledge and practice, at the time of the intervention. For Medico-Legal purposes, it is also important to point out that variation in procedural techniques and pharmacological choices are the acceptable norm. The indications, contraindications, technique, and results of the above procedure  should only be interpreted and judged by a Board-Certified Interventional Pain Specialist with extensive familiarity and expertise in the same exact procedure and technique.

## 2017-10-04 NOTE — Patient Instructions (Signed)

## 2017-10-05 ENCOUNTER — Telehealth: Payer: Self-pay

## 2017-10-05 NOTE — Telephone Encounter (Signed)
Post procedure phone call.  Left message.  

## 2017-11-09 ENCOUNTER — Other Ambulatory Visit: Payer: Self-pay

## 2017-11-09 ENCOUNTER — Encounter: Payer: Self-pay | Admitting: Student in an Organized Health Care Education/Training Program

## 2017-11-09 ENCOUNTER — Ambulatory Visit
Payer: BLUE CROSS/BLUE SHIELD | Attending: Student in an Organized Health Care Education/Training Program | Admitting: Student in an Organized Health Care Education/Training Program

## 2017-11-09 VITALS — BP 152/72 | HR 84 | Temp 98.6°F | Resp 18 | Ht 63.0 in | Wt 222.0 lb

## 2017-11-09 DIAGNOSIS — Z791 Long term (current) use of non-steroidal anti-inflammatories (NSAID): Secondary | ICD-10-CM | POA: Insufficient documentation

## 2017-11-09 DIAGNOSIS — Z7984 Long term (current) use of oral hypoglycemic drugs: Secondary | ICD-10-CM | POA: Diagnosis not present

## 2017-11-09 DIAGNOSIS — G894 Chronic pain syndrome: Secondary | ICD-10-CM | POA: Insufficient documentation

## 2017-11-09 DIAGNOSIS — Z79899 Other long term (current) drug therapy: Secondary | ICD-10-CM | POA: Insufficient documentation

## 2017-11-09 DIAGNOSIS — M4726 Other spondylosis with radiculopathy, lumbar region: Secondary | ICD-10-CM | POA: Insufficient documentation

## 2017-11-09 DIAGNOSIS — F17200 Nicotine dependence, unspecified, uncomplicated: Secondary | ICD-10-CM | POA: Insufficient documentation

## 2017-11-09 DIAGNOSIS — M5416 Radiculopathy, lumbar region: Secondary | ICD-10-CM | POA: Insufficient documentation

## 2017-11-09 DIAGNOSIS — E1142 Type 2 diabetes mellitus with diabetic polyneuropathy: Secondary | ICD-10-CM | POA: Diagnosis not present

## 2017-11-09 DIAGNOSIS — M5116 Intervertebral disc disorders with radiculopathy, lumbar region: Secondary | ICD-10-CM | POA: Diagnosis not present

## 2017-11-09 DIAGNOSIS — M48061 Spinal stenosis, lumbar region without neurogenic claudication: Secondary | ICD-10-CM | POA: Insufficient documentation

## 2017-11-09 DIAGNOSIS — Z885 Allergy status to narcotic agent status: Secondary | ICD-10-CM | POA: Insufficient documentation

## 2017-11-09 DIAGNOSIS — Z880 Allergy status to penicillin: Secondary | ICD-10-CM | POA: Diagnosis not present

## 2017-11-09 DIAGNOSIS — M4316 Spondylolisthesis, lumbar region: Secondary | ICD-10-CM | POA: Insufficient documentation

## 2017-11-09 DIAGNOSIS — M79652 Pain in left thigh: Secondary | ICD-10-CM | POA: Insufficient documentation

## 2017-11-09 NOTE — Progress Notes (Signed)
Patient's Name: Courtney Henderson  MRN: [id number]  Referring Provider: Cletis Athens, MD  DOB: 1960/11/30  PCP: Cletis Athens, MD  DOS: 11/09/2017  Note by: Gillis Santa, MD  Service setting: Ambulatory outpatient  Specialty: Interventional Pain Management  Location: [facility] (AMB) Pain Management Facility    Patient type: Established   Primary Reason(s) for Visit: Encounter for post-procedure evaluation of chronic illness with mild to moderate exacerbation CC: Follow-up  HPI  Courtney Henderson is a 57 y.o. year old, female patient, who comes today for a post-procedure evaluation. She has Lumbosacral spondylosis with radiculopathy; Polyarthralgia; and Diabetic polyneuropathy associated with type 2 diabetes mellitus ([place]) on their problem list. Her primarily concern today is the Follow-up  Pain Assessment: Location: Lower, Right, Left Back Radiating: Radiates through buttocks and back of legs bilateral to feet. Upper left thigh pain as well.  Onset: More than a month ago Duration: Chronic pain Quality: Constant, Aching, Discomfort("pulls my skin apart" ) Severity: 7 /10 (subjective, self-reported pain score)  Note: Reported level is inconsistent with clinical observations.                         When using our objective Pain Scale, levels between 6 and 10/10 are said to belong in an emergency room, as it progressively worsens from a 6/10, described as severely limiting, requiring emergency care not usually available at an outpatient pain management facility. At a 6/10 level, communication becomes difficult and requires great effort. Assistance to reach the emergency department may be required. Facial flushing and profuse sweating along with potentially dangerous increases in heart rate and blood pressure will be evident. Effect on ADL: "Takes much longer to get something done"  Timing: Constant Modifying factors: Ibuprogen, resting and sitting  BP: (!) 152/72  HR: 84  Courtney Henderson comes in today for  post-procedure evaluation.  Further details on both, my assessment(s), as well as the proposed treatment plan, please see below.  Post-Procedure Assessment  10/04/2017 Procedure: Left L4-L5 ESI #1 Pre-procedure pain score:  8/10 Post-procedure pain score: 0/10         Influential Factors: BMI: 39.33 kg/m Intra-procedural challenges: None observed.         Assessment challenges: None detected.              Reported side-effects: None.        Post-procedural adverse reactions or complications: None reported         Sedation: Please see nurses note. When no sedatives are used, the analgesic levels obtained are directly associated to the effectiveness of the local anesthetics. However, when sedation is provided, the level of analgesia obtained during the initial 1 hour following the intervention, is believed to be the result of a combination of factors. These factors may include, but are not limited to: 1. The effectiveness of the local anesthetics used. 2. The effects of the analgesic(s) and/or anxiolytic(s) used. 3. The degree of discomfort experienced by the patient at the time of the procedure. 4. The patients ability and reliability in recalling and recording the events. 5. The presence and influence of possible secondary gains and/or psychosocial factors. Reported result: Relief experienced during the 1st hour after the procedure: 100 % (Ultra-Short Term Relief)            Interpretative annotation: Clinically appropriate result. Analgesia during this period is likely to be Local Anesthetic and/or IV Sedative (Analgesic/Anxiolytic) related.          Effects  of local anesthetic: The analgesic effects attained during this period are directly associated to the localized infiltration of local anesthetics and therefore cary significant diagnostic value as to the etiological location, or anatomical origin, of the pain. Expected duration of relief is directly dependent on the pharmacodynamics of  the local anesthetic used. Long-acting (4-6 hours) anesthetics used.  Reported result: Relief during the next 4 to 6 hour after the procedure: 100 % (Short-Term Relief)            Interpretative annotation: Clinically appropriate result. Analgesia during this period is likely to be Local Anesthetic-related.          Long-term benefit: Defined as the period of time past the expected duration of local anesthetics (1 hour for short-acting and 4-6 hours for long-acting). With the possible exception of prolonged sympathetic blockade from the local anesthetics, benefits during this period are typically attributed to, or associated with, other factors such as analgesic sensory neuropraxia, antiinflammatory effects, or beneficial biochemical changes provided by agents other than the local anesthetics.  Reported result: Extended relief following procedure: 15 %("100% relief for 3 days") (Long-Term Relief)            Interpretative annotation: Clinically possible results. Good relief. No permanent benefit expected. Inflammation plays a part in the etiology to the pain.          Current benefits: Defined as reported results that persistent at this point in time.   Analgesia: 25 %            Function: Somewhat improved ROM: Somewhat improved Interpretative annotation: Recurrence of symptoms. No permanent benefit expected. Effective diagnostic intervention.          Interpretation: Results would suggest a successful diagnostic intervention.                  Plan:  Repeat treatment or therapy and compare extent and duration of benefits.                Meds   Current Outpatient Medications:  .  Ascorbic Acid (VITAMIN C) 100 MG tablet, Take by mouth., Disp: , Rfl:  .  Blood Glucose Monitoring Suppl (CONTOUR NEXT EZ MONITOR) w/Device KIT, CHECK BLOOD SUGAR TWICE A DAY, Disp: , Rfl: 0 .  BREO ELLIPTA 100-25 MCG/INH AEPB, INHALE 1 PUFF BY MOUTH DAILY, Disp: , Rfl: 7 .  furosemide (LASIX) 20 MG tablet, Take 20  mg by mouth daily., Disp: , Rfl: 5 .  gabapentin (NEURONTIN) 300 MG capsule, Take 2 capsules (600 mg total) by mouth at bedtime. (Patient taking differently: Take 300 mg by mouth at bedtime. ), Disp: 60 capsule, Rfl: 2 .  glucose blood (BAYER CONTOUR NEXT TEST) test strip, CHECK BLOOD SUGAR TWICE A DAY, Disp: , Rfl: 6 .  ibuprofen (ADVIL,MOTRIN) 200 MG tablet, Take 200 mg by mouth 6 (six) times daily., Disp: , Rfl:  .  KLOR-CON 10 10 MEQ tablet, Take 10 mEq by mouth daily., Disp: , Rfl: 4 .  magnesium 30 MG tablet, Take 30 mg by mouth daily., Disp: , Rfl:  .  metFORMIN (GLUCOPHAGE) 500 MG tablet, Take 500 mg by mouth 2 (two) times daily., Disp: , Rfl: 12 .  MICROLET LANCETS MISC, CHECK BLOOD SUGAR TWICE A DAY, Disp: , Rfl: 6 .  mometasone (NASONEX) 50 MCG/ACT nasal spray, INSTILL 1 SPRAY NASALLY TWICE A DAY, Disp: , Rfl: 3 .  PROLENSA 0.07 % SOLN, INSTILL 1 DROP IN LEFT EYE AT BEDTIME, Disp: ,  Rfl: 1 .  timolol (TIMOPTIC) 0.5 % ophthalmic solution, , Disp: , Rfl: 3 .  meloxicam (MOBIC) 7.5 MG tablet, Take 7.5 mg by mouth daily., Disp: , Rfl: 1  ROS  Constitutional: Denies any fever or chills Gastrointestinal: No reported hemesis, hematochezia, vomiting, or acute GI distress Musculoskeletal: Denies any acute onset joint swelling, redness, loss of ROM, or weakness Neurological: No reported episodes of acute onset apraxia, aphasia, dysarthria, agnosia, amnesia, paralysis, loss of coordination, or loss of consciousness  Allergies  Ms. Bailon is allergic to morphine and related and penicillins.  PFSH  Drug: Ms. Guinta  has no drug history on file. Alcohol:  has no alcohol history on file. Tobacco:  reports that she has been smoking. She has never used smokeless tobacco. Medical:  has no past medical history on file. Surgical: Ms. Perham  has no past surgical history on file. Family: family history is not on file.  Constitutional Exam  General appearance: Well nourished, well developed, and  well hydrated. In no apparent acute distress Vitals:   11/09/17 0944  BP: (!) 152/72  Pulse: 84  Resp: 18  Temp: 98.6 F (37 C)  SpO2: 96%  Weight: 222 lb (100.7 kg)  Height: _0  (1.6 m)   BMI Assessment: Estimated body mass index is 39.33 kg/m as calculated from the following:   Height as of this encounter: _1  (1.6 m).   Weight as of this encounter: 222 lb (100.7 kg).  BMI interpretation table: BMI level Category Range association with higher incidence of chronic pain  <18 kg/m2 Underweight   18.5-24.9 kg/m2 Ideal body weight   25-29.9 kg/m2 Overweight Increased incidence by 20%  30-34.9 kg/m2 Obese (Class I) Increased incidence by 68%  35-39.9 kg/m2 Severe obesity (Class II) Increased incidence by 136%  >40 kg/m2 Extreme obesity (Class III) Increased incidence by 254%   Patient's current BMI Ideal Body weight  Body mass index is 39.33 kg/m. Ideal body weight: 52.4 kg (115 lb 8.3 oz) Adjusted ideal body weight: 71.7 kg (158 lb 1.8 oz)   BMI Readings from Last 4 Encounters:  11/09/17 39.33 kg/m  10/04/17 39.33 kg/m  09/07/17 40.60 kg/m  07/02/14 38.28 kg/m   Wt Readings from Last 4 Encounters:  11/09/17 222 lb (100.7 kg)  10/04/17 222 lb (100.7 kg)  09/07/17 222 lb (100.7 kg)  07/02/14 223 lb (101.2 kg)  Psych/Mental status: Alert, oriented x 3 (person, place, & time)       Eyes: PERLA Respiratory: No evidence of acute respiratory distress  Cervical Spine Area Exam  Skin & Axial Inspection: No masses, redness, edema, swelling, or associated skin lesions Alignment: Symmetrical Functional ROM: Unrestricted ROM      Stability: No instability detected Muscle Tone/Strength: Functionally intact. No obvious neuro-muscular anomalies detected. Sensory (Neurological): Unimpaired Palpation: No palpable anomalies              Upper Extremity (UE) Exam    Side: Right upper extremity  Side: Left upper extremity  Skin & Extremity Inspection: Skin color, temperature,  and hair growth are WNL. No peripheral edema or cyanosis. No masses, redness, swelling, asymmetry, or associated skin lesions. No contractures.  Skin & Extremity Inspection: Skin color, temperature, and hair growth are WNL. No peripheral edema or cyanosis. No masses, redness, swelling, asymmetry, or associated skin lesions. No contractures.  Functional ROM: Unrestricted ROM          Functional ROM: Unrestricted ROM  Muscle Tone/Strength: Functionally intact. No obvious neuro-muscular anomalies detected.  Muscle Tone/Strength: Functionally intact. No obvious neuro-muscular anomalies detected.  Sensory (Neurological): Unimpaired          Sensory (Neurological): Unimpaired          Palpation: No palpable anomalies              Palpation: No palpable anomalies              Provocative Test(s):  Phalen's test: deferred Tinel's test: deferred Apley's scratch test (touch opposite shoulder):  Action 1 (Across chest): deferred Action 2 (Overhead): deferred Action 3 (LB reach): deferred   Provocative Test(s):  Phalen's test: deferred Tinel's test: deferred Apley's scratch test (touch opposite shoulder):  Action 1 (Across chest): deferred Action 2 (Overhead): deferred Action 3 (LB reach): deferred    Thoracic Spine Area Exam  Skin & Axial Inspection: No masses, redness, or swelling Alignment: Symmetrical Functional ROM: Unrestricted ROM Stability: No instability detected Muscle Tone/Strength: Functionally intact. No obvious neuro-muscular anomalies detected. Sensory (Neurological): Unimpaired Muscle strength & Tone: No palpable anomalies  Lumbar Spine Area Exam  Skin & Axial Inspection: No masses, redness, or swelling Alignment: Symmetrical Functional ROM: Decreased ROM affecting both sides Stability: No instability detected Muscle Tone/Strength: Functionally intact. No obvious neuro-muscular anomalies detected. Sensory (Neurological): Dermatomal pain pattern Palpation: No palpable  anomalies       Provocative Tests: Hyperextension/rotation test: deferred today       Lumbar quadrant test (Kemp's test): deferred today       Lateral bending test: (+) ipsilateral radicular pain, bilaterally. Positive for bilateral foraminal stenosis. Patrick's Maneuver: deferred today                   FABER test: deferred today                   S-I anterior distraction/compression test: deferred today         S-I lateral compression test: deferred today         S-I Thigh-thrust test: deferred today         S-I Gaenslen's test: deferred today          Gait & Posture Assessment  Ambulation: Unassisted Gait: Relatively normal for age and body habitus Posture: WNL   Lower Extremity Exam    Side: Right lower extremity  Side: Left lower extremity  Stability: No instability observed          Stability: No instability observed          Skin & Extremity Inspection: Skin color, temperature, and hair growth are WNL. No peripheral edema or cyanosis. No masses, redness, swelling, asymmetry, or associated skin lesions. No contractures.  Skin & Extremity Inspection: Skin color, temperature, and hair growth are WNL. No peripheral edema or cyanosis. No masses, redness, swelling, asymmetry, or associated skin lesions. No contractures.  Functional ROM: Unrestricted ROM                  Functional ROM: Unrestricted ROM                  Muscle Tone/Strength: Functionally intact. No obvious neuro-muscular anomalies detected.  Muscle Tone/Strength: Functionally intact. No obvious neuro-muscular anomalies detected.  Sensory (Neurological): Unimpaired  Sensory (Neurological): Unimpaired  Palpation: No palpable anomalies  Palpation: No palpable anomalies   Assessment  Primary Diagnosis & Pertinent Problem List: The primary encounter diagnosis was Lumbar radiculopathy. Diagnoses of Lumbar foraminal stenosis, Diabetic polyneuropathy associated with  type 2 diabetes mellitus ([place]), and Chronic pain syndrome were  also pertinent to this visit.  Status Diagnosis  Controlled Controlled Controlled 1. Lumbar radiculopathy   2. Lumbar foraminal stenosis   3. Diabetic polyneuropathy associated with type 2 diabetes mellitus ([place])   4. Chronic pain syndrome      General Recommendations: The pain condition that the patient suffers from is best treated with a multidisciplinary approach that involves an increase in physical activity to prevent de-conditioning and worsening of the pain cycle, as well as psychological counseling (formal and/or informal) to address the co-morbid psychological affects of pain. Treatment will often involve judicious use of pain medications and interventional procedures to decrease the pain, allowing the patient to participate in the physical activity that will ultimately produce long-lasting pain reductions. The goal of the multidisciplinary approach is to return the patient to a higher level of overall function and to restore their ability to perform activities of daily living.  57 year old female with a history of type 2 diabetes, non-insulin-dependent, who presents with axial low back pain that radiates down bilateral legs stopping above her knees usually.  She states that the left leg is worse in the right leg.  She describes her pain is primarily appendicular rather than axial.  Patient does have pain with facet loading and lateral rotation also suggesting a facetogenic component to her low back pain.  Patient's CT of her lumbar spine reveals moderate to severe bilateral foraminal stenosis at L4-L5 along with mild left subarticular narrowing at L3-L4 with moderate bilateral foraminal stenosis at 3 4 as well.  Patient also has multilevel spondylosis and lumbar degenerative disc disease.  Of note patient does have advanced facet hypertrophy along with 7 mm anterolisthesis at L4-L5 which could be impacting the L5 and S1 nerve roots bilaterally.  Patient follows up status post left L4-L5 ESI  1 10/04/2017.  Patient endorses approximately 25% pain relief that is ongoing.  Patient would like to repeat lumbar epidural steroid injection which is reasonable.  Risks and benefits were discussed and patient like to proceed.  Plan for left L4-L5 ESI #2  Plan of Care  Lab-work, procedure(s), and/or referral(s): Orders Placed This Encounter  Procedures  . Lumbar Epidural Injection    Interventional management options: Ms. Revere was informed that there is no guarantee that she would be a candidate for interventional therapies. The decision will be based on the results of diagnostic studies, as well as Ms. Fjelstad's risk profile.  Procedure(s) under consideration:  Bilateral lumbar facet medial branch nerve blocks Bilateral SI joint injection Spinal cord stimulator trial   Time Note: Greater than 50% of the 25 minute(s) of face-to-face time spent with Ms. Pantaleon, was spent in counseling/coordination of care regarding: Ms. Paschal primary cause of pain, the treatment plan, treatment alternatives, the risks and possible complications of proposed treatment, going over the informed consent, the results, interpretation and significance of  her recent diagnostic interventional treatment(s), realistic expectations and the goals of pain management (increased in functionality).  Provider-requested follow-up: Return in about 2 weeks (around 11/23/2017) for Procedure.  Future Appointments  Date Time Provider [place]  11/22/2017  9:00 AM Gillis Santa, MD [facility] None  11/29/2017  8:15 AM Garrel Ridgel, DPM [id number]    Primary Care Physician: Cletis Athens, MD Location: [facility] Note by: Gillis Santa, M.D Date: 11/09/2017; Time: 12:41 PM  Patient Instructions  ____________________________________________________________________________________________  Preparing for Procedure with Sedation  Instructions: . Oral Intake:  Do not eat or  drink anything for at least 8 hours prior to your procedure. . Transportation: Public transportation is not allowed. Bring an adult driver. The driver must be physically present in our waiting room before any procedure can be started. Marland Kitchen Physical Assistance: Bring an adult physically capable of assisting you, in the event you need help. This adult should keep you company at home for at least 6 hours after the procedure. . Blood Pressure Medicine: Take your blood pressure medicine with a sip of water the morning of the procedure. . Blood thinners: Notify our staff if you are taking any blood thinners. Depending on which one you take, there will be specific instructions on how and when to stop it. . Diabetics on insulin: Notify the staff so that you can be scheduled 1st case in the morning. If your diabetes requires high dose insulin, take only  of your normal insulin dose the morning of the procedure and notify the staff that you have done so. . Preventing infections: Shower with an antibacterial soap the morning of your procedure. . Build-up your immune system: Take 1000 mg of Vitamin C with every meal (3 times a day) the day prior to your procedure. Marland Kitchen Antibiotics: Inform the staff if you have a condition or reason that requires you to take antibiotics before dental procedures. . Pregnancy: If you are pregnant, call and cancel the procedure. . Sickness: If you have a cold, fever, or any active infections, call and cancel the procedure. . Arrival: You must be in the facility at least 30 minutes prior to your scheduled procedure. . Children: Do not bring children with you. . Dress appropriately: Bring dark clothing that you would not mind if they get stained. . Valuables: Do not bring any jewelry or valuables.  Procedure appointments are reserved for interventional treatments only. Marland Kitchen No Prescription Refills. . No medication changes will be discussed during procedure appointments. . No disability  issues will be discussed.  Reasons to call and reschedule or cancel your procedure: (Following these recommendations will minimize the risk of a serious complication.) . Surgeries: Avoid having procedures within 2 weeks of any surgery. (Avoid for 2 weeks before or after any surgery). . Flu Shots: Avoid having procedures within 2 weeks of a flu shots or . (Avoid for 2 weeks before or after immunizations). . Barium: Avoid having a procedure within 7-10 days after having had a radiological study involving the use of radiological contrast. (Myelograms, Barium swallow or enema study). . Heart attacks: Avoid any elective procedures or surgeries for the initial 6 months after a "Myocardial Infarction" (Heart Attack). . Blood thinners: It is imperative that you stop these medications before procedures. Let us know if you if you take any blood thinner.  . Infection: Avoid procedures during or within two weeks of an infection (including chest colds or gastrointestinal problems). Symptoms associated with infections include: Localized redness, fever, chills, night sweats or profuse sweating, burning sensation when voiding, cough, congestion, stuffiness, runny nose, sore throat, diarrhea, nausea, vomiting, cold or Flu symptoms, recent or current infections. It is specially important if the infection is over the area that we intend to treat. Marland Kitchen Heart and lung problems: Symptoms that may suggest an active cardiopulmonary problem include: cough, chest pain, breathing difficulties or shortness of breath, dizziness, ankle swelling, uncontrolled high or unusually low blood pressure, and/or palpitations. If you are experiencing any of these symptoms, cancel your procedure and contact your primary care physician for an evaluation.  Remember:  Regular Business hours are:  Monday to Thursday 8:00 AM to 4:00 PM  Provider's Schedule: Milinda Pointer, MD:  Procedure days: Tuesday and Thursday 7:30 AM to 4:00 PM  Gillis Santa, MD:  Procedure days: Monday and Wednesday 7:30 AM to 4:00 PM ____________________________________________________________________________________________    Epidural Steroid Injection Patient Information  Description: The epidural space surrounds the nerves as they exit the spinal cord.  In some patients, the nerves can be compressed and inflamed by a bulging disc or a tight spinal canal (spinal stenosis).  By injecting steroids into the epidural space, we can bring irritated nerves into direct contact with a potentially helpful medication.  These steroids act directly on the irritated nerves and can reduce swelling and inflammation which often leads to decreased pain.  Epidural steroids may be injected anywhere along the spine and from the neck to the low back depending upon the location of your pain.   After numbing the skin with local anesthetic (like Novocaine), a small needle is passed into the epidural space slowly.  You may experience a sensation of pressure while this is being done.  The entire block usually last less than 10 minutes.  Conditions which may be treated by epidural steroids:   Low back and leg pain  Neck and arm pain  Spinal stenosis  Post-laminectomy syndrome  Herpes zoster (shingles) pain  Pain from compression fractures  Preparation for the injection:  1. Do not eat any solid food or dairy products within 8 hours of your appointment.  2. You may drink clear liquids up to 3 hours before appointment.  Clear liquids include water, black coffee, juice or soda.  No milk or cream please. 3. You may take your regular medication, including pain medications, with a sip of water before your appointment  Diabetics should hold regular insulin (if taken separately) and take 1/2 normal NPH dos the morning of the procedure.  Carry some sugar containing items with you to your appointment. 4. A driver must accompany you and be prepared to drive you home after your  procedure.  5. Bring all your current medications with your. 6. An IV may be inserted and sedation may be given at the discretion of the physician.   7. A blood pressure cuff, EKG and other monitors will often be applied during the procedure.  Some patients may need to have extra oxygen administered for a short period. 8. You will be asked to provide medical information, including your allergies, prior to the procedure.  We must know immediately if you are taking blood thinners (like Coumadin/Warfarin)  Or if you are allergic to IV iodine contrast (dye). We must know if you could possible be pregnant.  Possible side-effects:  Bleeding from needle site  Infection (rare, may require surgery)  Nerve injury (rare)  Numbness & tingling (temporary)  Difficulty urinating (rare, temporary)  Spinal headache ( a headache worse with upright posture)  Light -headedness (temporary)  Pain at injection site (several days)  Decreased blood pressure (temporary)  Weakness in arm/leg (temporary)  Pressure sensation in back/neck (temporary)  Call if you experience:  Fever/chills associated with headache or increased back/neck pain.  Headache worsened by an upright position.  New onset weakness or numbness of an extremity below the injection site  Hives or difficulty breathing (go to the emergency room)  Inflammation or drainage at the infection site  Severe back/neck pain  Any new symptoms which are concerning to you  Please note:  Although  the local anesthetic injected can often make your back or neck feel good for several hours after the injection, the pain will likely return.  It takes 3-7 days for steroids to work in the epidural space.  You may not notice any pain relief for at least that one week.  If effective, we will often do a series of three injections spaced 3-6 weeks apart to maximally decrease your pain.  After the initial series, we generally will wait several months  before considering a repeat injection of the same type.  If you have any questions, please call [phone] [facility]

## 2017-11-09 NOTE — Progress Notes (Signed)
Safety precautions to be maintained throughout the outpatient stay will include: orient to surroundings, keep bed in low position, maintain call bell within reach at all times, provide assistance with transfer out of bed and ambulation.  

## 2017-11-09 NOTE — Patient Instructions (Addendum)
____________________________________________________________________________________________  Preparing for Procedure with Sedation  Instructions: . Oral Intake: Do not eat or drink anything for at least 8 hours prior to your procedure. . Transportation: Public transportation is not allowed. Bring an adult driver. The driver must be physically present in our waiting room before any procedure can be started. . Physical Assistance: Bring an adult physically capable of assisting you, in the event you need help. This adult should keep you company at home for at least 6 hours after the procedure. . Blood Pressure Medicine: Take your blood pressure medicine with a sip of water the morning of the procedure. . Blood thinners: Notify our staff if you are taking any blood thinners. Depending on which one you take, there will be specific instructions on how and when to stop it. . Diabetics on insulin: Notify the staff so that you can be scheduled 1st case in the morning. If your diabetes requires high dose insulin, take only  of your normal insulin dose the morning of the procedure and notify the staff that you have done so. . Preventing infections: Shower with an antibacterial soap the morning of your procedure. . Build-up your immune system: Take 1000 mg of Vitamin C with every meal (3 times a day) the day prior to your procedure. . Antibiotics: Inform the staff if you have a condition or reason that requires you to take antibiotics before dental procedures. . Pregnancy: If you are pregnant, call and cancel the procedure. . Sickness: If you have a cold, fever, or any active infections, call and cancel the procedure. . Arrival: You must be in the facility at least 30 minutes prior to your scheduled procedure. . Children: Do not bring children with you. . Dress appropriately: Bring dark clothing that you would not mind if they get stained. . Valuables: Do not bring any jewelry or valuables.  Procedure  appointments are reserved for interventional treatments only. . No Prescription Refills. . No medication changes will be discussed during procedure appointments. . No disability issues will be discussed.  Reasons to call and reschedule or cancel your procedure: (Following these recommendations will minimize the risk of a serious complication.) . Surgeries: Avoid having procedures within 2 weeks of any surgery. (Avoid for 2 weeks before or after any surgery). . Flu Shots: Avoid having procedures within 2 weeks of a flu shots or . (Avoid for 2 weeks before or after immunizations). . Barium: Avoid having a procedure within 7-10 days after having had a radiological study involving the use of radiological contrast. (Myelograms, Barium swallow or enema study). . Heart attacks: Avoid any elective procedures or surgeries for the initial 6 months after a "Myocardial Infarction" (Heart Attack). . Blood thinners: It is imperative that you stop these medications before procedures. Let us know if you if you take any blood thinner.  . Infection: Avoid procedures during or within two weeks of an infection (including chest colds or gastrointestinal problems). Symptoms associated with infections include: Localized redness, fever, chills, night sweats or profuse sweating, burning sensation when voiding, cough, congestion, stuffiness, runny nose, sore throat, diarrhea, nausea, vomiting, cold or Flu symptoms, recent or current infections. It is specially important if the infection is over the area that we intend to treat. . Heart and lung problems: Symptoms that may suggest an active cardiopulmonary problem include: cough, chest pain, breathing difficulties or shortness of breath, dizziness, ankle swelling, uncontrolled high or unusually low blood pressure, and/or palpitations. If you are experiencing any of these symptoms, cancel   your procedure and contact your primary care physician for an evaluation.  Remember:   Regular Business hours are:  Monday to Thursday 8:00 AM to 4:00 PM  Provider's Schedule: Delano Metz, MD:  Procedure days: Tuesday and Thursday 7:30 AM to 4:00 PM  Edward Jolly, MD:  Procedure days: Monday and Wednesday 7:30 AM to 4:00 PM ____________________________________________________________________________________________    Epidural Steroid Injection Patient Information  Description: The epidural space surrounds the nerves as they exit the spinal cord.  In some patients, the nerves can be compressed and inflamed by a bulging disc or a tight spinal canal (spinal stenosis).  By injecting steroids into the epidural space, we can bring irritated nerves into direct contact with a potentially helpful medication.  These steroids act directly on the irritated nerves and can reduce swelling and inflammation which often leads to decreased pain.  Epidural steroids may be injected anywhere along the spine and from the neck to the low back depending upon the location of your pain.   After numbing the skin with local anesthetic (like Novocaine), a small needle is passed into the epidural space slowly.  You may experience a sensation of pressure while this is being done.  The entire block usually last less than 10 minutes.  Conditions which may be treated by epidural steroids:   Low back and leg pain  Neck and arm pain  Spinal stenosis  Post-laminectomy syndrome  Herpes zoster (shingles) pain  Pain from compression fractures  Preparation for the injection:  1. Do not eat any solid food or dairy products within 8 hours of your appointment.  2. You may drink clear liquids up to 3 hours before appointment.  Clear liquids include water, black coffee, juice or soda.  No milk or cream please. 3. You may take your regular medication, including pain medications, with a sip of water before your appointment  Diabetics should hold regular insulin (if taken separately) and take 1/2 normal  NPH dos the morning of the procedure.  Carry some sugar containing items with you to your appointment. 4. A driver must accompany you and be prepared to drive you home after your procedure.  5. Bring all your current medications with your. 6. An IV may be inserted and sedation may be given at the discretion of the physician.   7. A blood pressure cuff, EKG and other monitors will often be applied during the procedure.  Some patients may need to have extra oxygen administered for a short period. 8. You will be asked to provide medical information, including your allergies, prior to the procedure.  We must know immediately if you are taking blood thinners (like Coumadin/Warfarin)  Or if you are allergic to IV iodine contrast (dye). We must know if you could possible be pregnant.  Possible side-effects:  Bleeding from needle site  Infection (rare, may require surgery)  Nerve injury (rare)  Numbness & tingling (temporary)  Difficulty urinating (rare, temporary)  Spinal headache ( a headache worse with upright posture)  Light -headedness (temporary)  Pain at injection site (several days)  Decreased blood pressure (temporary)  Weakness in arm/leg (temporary)  Pressure sensation in back/neck (temporary)  Call if you experience:  Fever/chills associated with headache or increased back/neck pain.  Headache worsened by an upright position.  New onset weakness or numbness of an extremity below the injection site  Hives or difficulty breathing (go to the emergency room)  Inflammation or drainage at the infection site  Severe back/neck pain  Any  new symptoms which are concerning to you  Please note:  Although the local anesthetic injected can often make your back or neck feel good for several hours after the injection, the pain will likely return.  It takes 3-7 days for steroids to work in the epidural space.  You may not notice any pain relief for at least that one week.  If  effective, we will often do a series of three injections spaced 3-6 weeks apart to maximally decrease your pain.  After the initial series, we generally will wait several months before considering a repeat injection of the same type.  If you have any questions, please call [phone] [facility]

## 2017-11-22 ENCOUNTER — Encounter: Payer: Self-pay | Admitting: Student in an Organized Health Care Education/Training Program

## 2017-11-22 ENCOUNTER — Other Ambulatory Visit: Payer: Self-pay

## 2017-11-22 ENCOUNTER — Ambulatory Visit
Admission: RE | Admit: 2017-11-22 | Discharge: 2017-11-22 | Disposition: A | Discharge status: Home / Self Care | Payer: BLUE CROSS/BLUE SHIELD | Source: Ambulatory Visit | Attending: Student in an Organized Health Care Education/Training Program | Admitting: Student in an Organized Health Care Education/Training Program

## 2017-11-22 ENCOUNTER — Ambulatory Visit (HOSPITAL_BASED_OUTPATIENT_CLINIC_OR_DEPARTMENT_OTHER): Payer: BLUE CROSS/BLUE SHIELD | Admitting: Student in an Organized Health Care Education/Training Program

## 2017-11-22 VITALS — BP 135/66 | HR 76 | Temp 98.0°F | Resp 18 | Ht 63.0 in | Wt 222.0 lb

## 2017-11-22 DIAGNOSIS — M79604 Pain in right leg: Secondary | ICD-10-CM | POA: Diagnosis not present

## 2017-11-22 DIAGNOSIS — M545 Low back pain: Secondary | ICD-10-CM | POA: Diagnosis present

## 2017-11-22 DIAGNOSIS — M5416 Radiculopathy, lumbar region: Secondary | ICD-10-CM

## 2017-11-22 DIAGNOSIS — M48061 Spinal stenosis, lumbar region without neurogenic claudication: Secondary | ICD-10-CM | POA: Insufficient documentation

## 2017-11-22 DIAGNOSIS — Z880 Allergy status to penicillin: Secondary | ICD-10-CM | POA: Diagnosis not present

## 2017-11-22 DIAGNOSIS — M79605 Pain in left leg: Secondary | ICD-10-CM | POA: Diagnosis not present

## 2017-11-22 DIAGNOSIS — Z885 Allergy status to narcotic agent status: Secondary | ICD-10-CM | POA: Diagnosis not present

## 2017-11-22 MED ORDER — LIDOCAINE HCL 2 % IJ SOLN
INTRAMUSCULAR | Status: AC
  Filled 2017-11-22: qty 20

## 2017-11-22 MED ORDER — SODIUM CHLORIDE 0.9% FLUSH
2.0000 mL | Freq: Once | INTRAVENOUS | Status: AC
  Administered 2017-11-22: 2 mL

## 2017-11-22 MED ORDER — DEXAMETHASONE SODIUM PHOSPHATE 10 MG/ML IJ SOLN
10.0000 mg | Freq: Once | INTRAMUSCULAR | Status: AC
  Administered 2017-11-22: 10 mg

## 2017-11-22 MED ORDER — LIDOCAINE HCL 2 % IJ SOLN
10.0000 mL | Freq: Once | INTRAMUSCULAR | Status: AC
  Administered 2017-11-22: 400 mg

## 2017-11-22 MED ORDER — ROPIVACAINE HCL 2 MG/ML IJ SOLN
2.0000 mL | Freq: Once | INTRAMUSCULAR | Status: AC
  Administered 2017-11-22: 10 mL via EPIDURAL

## 2017-11-22 MED ORDER — LACTATED RINGERS IV SOLN
1000.0000 mL | Freq: Once | INTRAVENOUS | Status: AC
  Administered 2017-11-22: 1000 mL via INTRAVENOUS

## 2017-11-22 MED ORDER — DEXAMETHASONE SODIUM PHOSPHATE 10 MG/ML IJ SOLN
INTRAMUSCULAR | Status: AC
  Filled 2017-11-22: qty 1

## 2017-11-22 MED ORDER — IOPAMIDOL (ISOVUE-M 200) INJECTION 41%
INTRAMUSCULAR | Status: AC
  Filled 2017-11-22: qty 10

## 2017-11-22 MED ORDER — FENTANYL CITRATE (PF) 100 MCG/2ML IJ SOLN
INTRAMUSCULAR | Status: AC
  Filled 2017-11-22: qty 2

## 2017-11-22 MED ORDER — SODIUM CHLORIDE 0.9 % IJ SOLN
INTRAMUSCULAR | Status: AC
  Filled 2017-11-22: qty 10

## 2017-11-22 MED ORDER — IOPAMIDOL (ISOVUE-M 200) INJECTION 41%
10.0000 mL | Freq: Once | INTRAMUSCULAR | Status: AC
  Administered 2017-11-22: 10 mL via EPIDURAL

## 2017-11-22 MED ORDER — ROPIVACAINE HCL 2 MG/ML IJ SOLN
INTRAMUSCULAR | Status: AC
  Filled 2017-11-22: qty 10

## 2017-11-22 MED ORDER — FENTANYL CITRATE (PF) 100 MCG/2ML IJ SOLN
25.0000 ug | INTRAMUSCULAR | Status: DC | PRN
  Administered 2017-11-22: 50 ug via INTRAVENOUS

## 2017-11-22 NOTE — Patient Instructions (Signed)

## 2017-11-22 NOTE — Progress Notes (Signed)
Patient's Name: Courtney Henderson  MRN: [id number]  Referring Provider: Corky Downs, MD  DOB: 04-Dec-1960  PCP: Corky Downs, MD  DOS: 11/22/2017  Note by: Edward Jolly, MD  Service setting: Ambulatory outpatient  Specialty: Interventional Pain Management  Patient type: Established  Location: [facility] (AMB) Pain Management Facility  Visit type: Interventional Procedure   Primary Reason for Visit: Interventional Pain Management Treatment. CC: Back Pain (lower) and Leg Pain (bilaterally, left is worse)  Procedure:          Anesthesia, Analgesia, Anxiolysis:  Type: Therapeutic Inter-Laminar Epidural Steroid Injection  #2  Region: Lumbar Level: L4-5 Level. Laterality: Midline slightly to LEFT         Type: Moderate (Conscious) Sedation combined with Local Anesthesia Indication(s): Analgesia and Anxiety Route: Intravenous (IV) IV Access: Secured Sedation: Meaningful verbal contact was maintained at all times during the procedure  Local Anesthetic: Lidocaine 1-2%   Indications: 1. Lumbar radiculopathy   2. Lumbar foraminal stenosis    Pain Score: Pre-procedure: 7 /10 Post-procedure: 0-No pain/10  Pre-op Assessment:  Courtney Henderson is a 57 y.o. (year old), female patient, seen today for interventional treatment. She  has no past surgical history on file. Courtney Henderson has a current medication list which includes the following prescription(s): vitamin c, contour next ez monitor, breo ellipta, furosemide, gabapentin, glucose blood, ibuprofen, klor-con 10, magnesium, metformin, microlet lancets, mometasone, timolol, meloxicam, and prolensa, and the following Facility-Administered Medications: fentanyl and lactated ringers. Her primarily concern today is the Back Pain (lower) and Leg Pain (bilaterally, left is worse)  Initial Vital Signs:  Pulse/HCG Rate: 83ECG Heart Rate: 82 Temp: 98.6 F (37 C) Resp: 18 BP: (!) 150/69 SpO2: 94 %  BMI: Estimated body mass index is 39.33 kg/m as calculated from the  following:   Height as of this encounter: 5\' 3"  (1.6 m).   Weight as of this encounter: 222 lb (100.7 kg).  Risk Assessment: Allergies: Reviewed. She is allergic to morphine and related and penicillins.  Allergy Precautions: None required Coagulopathies: Reviewed. None identified.  Blood-thinner therapy: None at this time Active Infection(s): Reviewed. None identified. Courtney Henderson is afebrile  Site Confirmation: Courtney Henderson was asked to confirm the procedure and laterality before marking the site Procedure checklist: Completed Consent: Before the procedure and under the influence of no sedative(s), amnesic(s), or anxiolytics, the patient was informed of the treatment options, risks and possible complications. To fulfill our ethical and legal obligations, as recommended by the American Medical Association's Code of Ethics, I have informed the patient of my clinical impression; the nature and purpose of the treatment or procedure; the risks, benefits, and possible complications of the intervention; the alternatives, including doing nothing; the risk(s) and benefit(s) of the alternative treatment(s) or procedure(s); and the risk(s) and benefit(s) of doing nothing. The patient was provided information about the general risks and possible complications associated with the procedure. These may include, but are not limited to: failure to achieve desired goals, infection, bleeding, organ or nerve damage, allergic reactions, paralysis, and death. In addition, the patient was informed of those risks and complications associated to Spine-related procedures, such as failure to decrease pain; infection (i.e.: Meningitis, epidural or intraspinal abscess); bleeding (i.e.: epidural hematoma, subarachnoid hemorrhage, or any other type of intraspinal or peri-dural bleeding); organ or nerve damage (i.e.: Any type of peripheral nerve, nerve root, or spinal cord injury) with subsequent damage to sensory, motor, and/or  autonomic systems, resulting in permanent pain, numbness, and/or weakness of one or several  areas of the body; allergic reactions; (i.e.: anaphylactic reaction); and/or death. Furthermore, the patient was informed of those risks and complications associated with the medications. These include, but are not limited to: allergic reactions (i.e.: anaphylactic or anaphylactoid reaction(s)); adrenal axis suppression; blood sugar elevation that in diabetics may result in ketoacidosis or comma; water retention that in patients with history of congestive heart failure may result in shortness of breath, pulmonary edema, and decompensation with resultant heart failure; weight gain; swelling or edema; medication-induced neural toxicity; particulate matter embolism and blood vessel occlusion with resultant organ, and/or nervous system infarction; and/or aseptic necrosis of one or more joints. Finally, the patient was informed that Medicine is not an exact science; therefore, there is also the possibility of unforeseen or unpredictable risks and/or possible complications that may result in a catastrophic outcome. The patient indicated having understood very clearly. We have given the patient no guarantees and we have made no promises. Enough time was given to the patient to ask questions, all of which were answered to the patient's satisfaction. Courtney Henderson has indicated that she wanted to continue with the procedure. Attestation: I, the ordering provider, attest that I have discussed with the patient the benefits, risks, side-effects, alternatives, likelihood of achieving goals, and potential problems during recovery for the procedure that I have provided informed consent. Date  Time: 11/22/2017  8:48 AM  Pre-Procedure Preparation:  Monitoring: As per clinic protocol. Respiration, ETCO2, SpO2, BP, heart rate and rhythm monitor placed and checked for adequate function Safety Precautions: Patient was assessed for positional  comfort and pressure points before starting the procedure. Time-out: I initiated and conducted the "Time-out" before starting the procedure, as per protocol. The patient was asked to participate by confirming the accuracy of the "Time Out" information. Verification of the correct person, site, and procedure were performed and confirmed by me, the nursing staff, and the patient. "Time-out" conducted as per Joint Commission's Universal Protocol (UP.01.01.01). Time: 0925  Description of Procedure:          Position: Prone with head of the table was raised to facilitate breathing. Target Area: The interlaminar space, initially targeting the lower laminar border of the superior vertebral body. Approach: Paramedial approach. Area Prepped: Entire Posterior Lumbar Region Prepping solution: ChloraPrep (2% chlorhexidine gluconate and 70% isopropyl alcohol) Safety Precautions: Aspiration looking for blood return was conducted prior to all injections. At no point did we inject any substances, as a needle was being advanced. No attempts were made at seeking any paresthesias. Safe injection practices and needle disposal techniques used. Medications properly checked for expiration dates. SDV (single dose vial) medications used. Description of the Procedure: Protocol guidelines were followed. The procedure needle was introduced through the skin, ipsilateral to the reported pain, and advanced to the target area. Bone was contacted and the needle walked caudad, until the lamina was cleared. The epidural space was identified using "loss-of-resistance technique" with 2-3 ml of PF-NaCl (0.9% NSS), in a 5cc LOR glass syringe. Vitals:   11/22/17 0935 11/22/17 0944 11/22/17 0955 11/22/17 1004  BP: (!) 161/79 139/68 137/78 135/66  Pulse: 76     Resp: (!) 22 18 20 18   Temp:  98 F (36.7 C)    TempSrc:      SpO2: 93% 95% 94% 96%  Weight:      Height:        Start Time: 0925 hrs. End Time: 0930 hrs. Materials:   Needle(s) Type: Epidural needle Gauge: 17G Length: 3.5-in Medication(s): Please  see orders for medications and dosing details. 9 CC solution consisting of 6 cc of preservative-free saline, 2 cc of 0.2% ropivacaine, 1 cc of Decadron 10 mg/cc Imaging Guidance (Spinal):          Type of Imaging Technique: Fluoroscopy Guidance (Spinal) Indication(s): Assistance in needle guidance and placement for procedures requiring needle placement in or near specific anatomical locations not easily accessible without such assistance. Exposure Time: Please see nurses notes. Contrast: Before injecting any contrast, we confirmed that the patient did not have an allergy to iodine, shellfish, or radiological contrast. Once satisfactory needle placement was completed at the desired level, radiological contrast was injected. Contrast injected under live fluoroscopy. No contrast complications. See chart for type and volume of contrast used. Fluoroscopic Guidance: I was personally present during the use of fluoroscopy. "Tunnel Vision Technique" used to obtain the best possible view of the target area. Parallax error corrected before commencing the procedure. "Direction-depth-direction" technique used to introduce the needle under continuous pulsed fluoroscopy. Once target was reached, antero-posterior, oblique, and lateral fluoroscopic projection used confirm needle placement in all planes. Images permanently stored in EMR. Interpretation: I personally interpreted the imaging intraoperatively. Adequate needle placement confirmed in multiple planes. Appropriate spread of contrast into desired area was observed. No evidence of afferent or efferent intravascular uptake. No intrathecal or subarachnoid spread observed. Permanent images saved into the patient's record.  Antibiotic Prophylaxis:   Anti-infectives (From admission, onward)   None     Indication(s): None identified  Post-operative Assessment:  Post-procedure  Vital Signs:  Pulse/HCG Rate: 7683 Temp: 98 F (36.7 C) Resp: 18 BP: 135/66 SpO2: 96 %  EBL: None  Complications: No immediate post-treatment complications observed by team, or reported by patient.  Note: The patient tolerated the entire procedure well. A repeat set of vitals were taken after the procedure and the patient was kept under observation following institutional policy, for this type of procedure. Post-procedural neurological assessment was performed, showing return to baseline, prior to discharge. The patient was provided with post-procedure discharge instructions, including a section on how to identify potential problems. Should any problems arise concerning this procedure, the patient was given instructions to immediately contact us, at any time, without hesitation. In any case, we plan to contact the patient by telephone for a follow-up status report regarding this interventional procedure.  Comments:  No additional relevant information. 5 out of 5 strength bilateral lower extremity: Plantar flexion, dorsiflexion, knee flexion, knee extension.  Plan of Care    Imaging Orders     DG C-Arm 1-60 Min-No Report Procedure Orders    No procedure(s) ordered today    Medications ordered for procedure: Meds ordered this encounter  Medications  . lactated ringers infusion 1,000 mL  . fentaNYL (SUBLIMAZE) injection 25-100 mcg    Make sure Narcan is available in the pyxis when using this medication. In the event of respiratory depression (RR< 8/min): Titrate NARCAN (naloxone) in increments of 0.1 to 0.2 mg IV at 2-3 minute intervals, until desired degree of reversal.  . iopamidol (ISOVUE-M) 41 % intrathecal injection 10 mL  . ropivacaine (PF) 2 mg/mL (0.2%) (NAROPIN) injection 2 mL  . sodium chloride flush (NS) 0.9 % injection 2 mL  . lidocaine (XYLOCAINE) 2 % (with pres) injection 200 mg  . dexamethasone (DECADRON) injection 10 mg   Medications administered: We administered  lactated ringers, fentaNYL, iopamidol, ropivacaine (PF) 2 mg/mL (0.2%), sodium chloride flush, lidocaine, and dexamethasone.  See the medical record for exact dosing,  route, and time of administration.  New Prescriptions   No medications on file   Disposition: Discharge home  Discharge Date & Time: 11/22/2017; 1004 hrs.   Physician-requested Follow-up: Return in about 6 weeks (around 01/03/2018) for Post Procedure Evaluation.  Future Appointments  Date Time Provider Department Center  11/29/2017  8:15 AM [place], [place] [facility]  01/03/2018  1:30 PM Edward Jolly, MD [facility] None   Primary Care Physician: Corky Downs, MD Location: [facility] Note by: Edward Jolly, MD Date: 11/22/2017; Time: 10:08 AM  Disclaimer:  Medicine is not an exact science. The only guarantee in medicine is that nothing is guaranteed. It is important to note that the decision to proceed with this intervention was based on the information collected from the patient. The Data and conclusions were drawn from the patient's questionnaire, the interview, and the physical examination. Because the information was provided in large part by the patient, it cannot be guaranteed that it has not been purposely or unconsciously manipulated. Every effort has been made to obtain as much relevant data as possible for this evaluation. It is important to note that the conclusions that lead to this procedure are derived in large part from the available data. Always take into account that the treatment Henderson also be dependent on availability of resources and existing treatment guidelines, considered by other Pain Management Practitioners as being common knowledge and practice, at the time of the intervention. For Medico-Legal purposes, it is also important to point out that variation in procedural techniques and pharmacological choices are the acceptable norm. The indications, contraindications,  technique, and results of the above procedure should only be interpreted and judged by a Board-Certified Interventional Pain Specialist with extensive familiarity and expertise in the same exact procedure and technique.

## 2017-11-22 NOTE — Progress Notes (Signed)
Safety precautions to be maintained throughout the outpatient stay will include: orient to surroundings, keep bed in low position, maintain call bell within reach at all times, provide assistance with transfer out of bed and ambulation.  

## 2017-11-23 ENCOUNTER — Telehealth: Payer: Self-pay

## 2017-11-23 NOTE — Telephone Encounter (Signed)
Denies any needs at this time. Instructed to call if needed. 

## 2017-11-29 ENCOUNTER — Ambulatory Visit: Payer: BLUE CROSS/BLUE SHIELD | Admitting: Podiatry

## 2017-11-29 ENCOUNTER — Encounter: Payer: Self-pay | Admitting: Podiatry

## 2017-11-29 DIAGNOSIS — M722 Plantar fascial fibromatosis: Secondary | ICD-10-CM | POA: Diagnosis not present

## 2017-11-29 NOTE — Progress Notes (Signed)
She presents today for follow-up of her plantar fasciitis states that the injections seem to be working a little longer each time seems to be doing a little better.  She states that she has this redness in her legs and swelling in her legs.  She also has a small dark area to the distal medial aspect of the left hallux that she just noticed a few days ago and she states it appears to be getting better.  Denies fever chills nausea vomitus likes pains states that she does have a cough that seems to be getting worse.  Objective: Vital signs are stable she is alert and oriented x3 pulses are strongly palpable.  Pitting edema is noted bilateral lower extremity with erythema they are warm to the touch but do not appear to be cellulitic in nature.  She has a dried blister that has become darkened with dried blood beneath it this will go and slough off on its own there is no signs of infection.  She has pain on palpation medial calcaneal tubercles bilateral which appears to be a little more distally now than it has been in the past.  Assessment: Venous insufficiency with pitting edema cough most likely secondary to smoking cannot rule out cardiac issues.  Plantar fasciitis chronic in nature.  Superficial diabetic ulceration that appears to be healing.  Plan: Discussed etiology pathology conservative surgical therapies at this point I offered referral she wants to make her own appointments recommended [facility] I also injected bilateral heels today with 20 mg Kenalog 5 mg Marcaine point maximal tenderness distal aspect of the heel right at the abductor muscle belly.  I will follow back with her in a couple of months hopefully she will get into see somebody in the near future.

## 2018-01-03 ENCOUNTER — Ambulatory Visit: Payer: BLUE CROSS/BLUE SHIELD | Admitting: Student in an Organized Health Care Education/Training Program

## 2018-01-05 ENCOUNTER — Ambulatory Visit: Payer: BLUE CROSS/BLUE SHIELD | Admitting: Podiatry

## 2018-01-05 ENCOUNTER — Encounter: Payer: Self-pay | Admitting: Podiatry

## 2018-01-05 DIAGNOSIS — M722 Plantar fascial fibromatosis: Secondary | ICD-10-CM

## 2018-01-05 NOTE — Progress Notes (Signed)
She presents today complaining of bilateral heel pain.  Objective: Pulses are palpable neurologic soreness intact she has pain on palpation medial calcaneal tubercles bilateral.  Assessment: Chronic intractable plantar fasciitis bilateral.  Plan: Injected the area today more proximally around the tubercles.  20 mg Kenalog 5 mg Marcaine point maximal tenderness bilateral.

## 2018-01-06 ENCOUNTER — Ambulatory Visit
Payer: BLUE CROSS/BLUE SHIELD | Attending: Student in an Organized Health Care Education/Training Program | Admitting: Student in an Organized Health Care Education/Training Program

## 2018-01-06 ENCOUNTER — Other Ambulatory Visit: Payer: Self-pay

## 2018-01-06 ENCOUNTER — Encounter: Payer: Self-pay | Admitting: Student in an Organized Health Care Education/Training Program

## 2018-01-06 VITALS — BP 140/60 | HR 86 | Temp 98.6°F | Resp 18 | Ht 63.0 in | Wt 222.0 lb

## 2018-01-06 DIAGNOSIS — Z791 Long term (current) use of non-steroidal anti-inflammatories (NSAID): Secondary | ICD-10-CM | POA: Insufficient documentation

## 2018-01-06 DIAGNOSIS — G894 Chronic pain syndrome: Secondary | ICD-10-CM | POA: Insufficient documentation

## 2018-01-06 DIAGNOSIS — M4726 Other spondylosis with radiculopathy, lumbar region: Secondary | ICD-10-CM | POA: Diagnosis not present

## 2018-01-06 DIAGNOSIS — Z885 Allergy status to narcotic agent status: Secondary | ICD-10-CM | POA: Diagnosis not present

## 2018-01-06 DIAGNOSIS — M48061 Spinal stenosis, lumbar region without neurogenic claudication: Secondary | ICD-10-CM | POA: Diagnosis not present

## 2018-01-06 DIAGNOSIS — M4727 Other spondylosis with radiculopathy, lumbosacral region: Secondary | ICD-10-CM | POA: Diagnosis not present

## 2018-01-06 DIAGNOSIS — M5416 Radiculopathy, lumbar region: Secondary | ICD-10-CM

## 2018-01-06 DIAGNOSIS — Z79899 Other long term (current) drug therapy: Secondary | ICD-10-CM | POA: Insufficient documentation

## 2018-01-06 DIAGNOSIS — E1142 Type 2 diabetes mellitus with diabetic polyneuropathy: Secondary | ICD-10-CM | POA: Diagnosis not present

## 2018-01-06 DIAGNOSIS — F17200 Nicotine dependence, unspecified, uncomplicated: Secondary | ICD-10-CM | POA: Insufficient documentation

## 2018-01-06 DIAGNOSIS — Z880 Allergy status to penicillin: Secondary | ICD-10-CM | POA: Diagnosis not present

## 2018-01-06 DIAGNOSIS — Z7984 Long term (current) use of oral hypoglycemic drugs: Secondary | ICD-10-CM | POA: Diagnosis not present

## 2018-01-06 DIAGNOSIS — M545 Low back pain: Secondary | ICD-10-CM | POA: Diagnosis present

## 2018-01-06 MED ORDER — METHYLPREDNISOLONE 4 MG PO TBPK: ORAL_TABLET | ORAL | 0 refills | Status: AC

## 2018-01-06 MED ORDER — GABAPENTIN 300 MG PO CAPS: 300.0000 mg | ORAL_CAPSULE | Freq: Every day | ORAL | 5 refills | Status: DC

## 2018-01-06 NOTE — Progress Notes (Signed)
Safety precautions to be maintained throughout the outpatient stay will include: orient to surroundings, keep bed in low position, maintain call bell within reach at all times, provide assistance with transfer out of bed and ambulation.  

## 2018-01-06 NOTE — Progress Notes (Signed)
Patient's Name: Courtney Henderson  MRN: [id number]  Referring Provider: Cletis Athens, MD  DOB: May 18, 1960  PCP: Cletis Athens, MD  DOS: 01/06/2018  Note by: Gillis Santa, MD  Service setting: Ambulatory outpatient  Specialty: Interventional Pain Management  Location: [facility] (AMB) Pain Management Facility    Patient type: Established   Primary Reason(s) for Visit: Encounter for post-procedure evaluation of chronic illness with mild to moderate exacerbation CC: Back Pain (lower)  HPI  Courtney Henderson is a 57 y.o. year old, female patient, who comes today for a post-procedure evaluation. She has Lumbosacral spondylosis with radiculopathy; Polyarthralgia; Diabetic polyneuropathy associated with type 2 diabetes mellitus ([place]); Lumbar radiculopathy; Chronic pain syndrome; and Lumbar foraminal stenosis on their problem list. Her primarily concern today is the Back Pain (lower)  Pain Assessment: Location: Lower Back Radiating: radiates through buttocks and back of legs to feet bilaterally; worst pain is posterior left thigh Onset: More than a month ago Duration: Chronic pain Quality: Aching, Discomfort Severity: 7 /10 (subjective, self-reported pain score)  Note: Reported level is inconsistent with clinical observations.                         When using our objective Pain Scale, levels between 6 and 10/10 are said to belong in an emergency room, as it progressively worsens from a 6/10, described as severely limiting, requiring emergency care not usually available at an outpatient pain management facility. At a 6/10 level, communication becomes difficult and requires great effort. Assistance to reach the emergency department may be required. Facial flushing and profuse sweating along with potentially dangerous increases in heart rate and blood pressure will be evident. Effect on ADL: limits daily activities Timing: Intermittent Modifying factors: resting, sitting, ibuprofen BP: 140/60  HR: 86  Courtney Henderson  comes in today for post-procedure evaluation.  Further details on both, my assessment(s), as well as the proposed treatment plan, please see below.  Post-Procedure Assessment  11/22/2017 Procedure: Left L4-L5 ESI Pre-procedure pain score:        /10 Post-procedure pain score: 0/10         Influential Factors: BMI: 39.33 kg/m Intra-procedural challenges: None observed.         Assessment challenges: None detected.              Reported side-effects: None.        Post-procedural adverse reactions or complications: None reported         Sedation: Please see nurses note. When no sedatives are used, the analgesic levels obtained are directly associated to the effectiveness of the local anesthetics. However, when sedation is provided, the level of analgesia obtained during the initial 1 hour following the intervention, is believed to be the result of a combination of factors. These factors may include, but are not limited to: 1. The effectiveness of the local anesthetics used. 2. The effects of the analgesic(s) and/or anxiolytic(s) used. 3. The degree of discomfort experienced by the patient at the time of the procedure. 4. The patients ability and reliability in recalling and recording the events. 5. The presence and influence of possible secondary gains and/or psychosocial factors. Reported result: Relief experienced during the 1st hour after the procedure: 100 % (Ultra-Short Term Relief)            Interpretative annotation: Clinically appropriate result. Analgesia during this period is likely to be Local Anesthetic and/or IV Sedative (Analgesic/Anxiolytic) related.  Effects of local anesthetic: The analgesic effects attained during this period are directly associated to the localized infiltration of local anesthetics and therefore cary significant diagnostic value as to the etiological location, or anatomical origin, of the pain. Expected duration of relief is directly dependent on the  pharmacodynamics of the local anesthetic used. Long-acting (4-6 hours) anesthetics used.  Reported result: Relief during the next 4 to 6 hour after the procedure: 100 % (Short-Term Relief)            Interpretative annotation: Clinically appropriate result. Analgesia during this period is likely to be Local Anesthetic-related.          Long-term benefit: Defined as the period of time past the expected duration of local anesthetics (1 hour for short-acting and 4-6 hours for long-acting). With the possible exception of prolonged sympathetic blockade from the local anesthetics, benefits during this period are typically attributed to, or associated with, other factors such as analgesic sensory neuropraxia, antiinflammatory effects, or beneficial biochemical changes provided by agents other than the local anesthetics.  Reported result: Extended relief following procedure: 100 %(beginning day after procedure, pt states she felt no better than she had before procedure) (Long-Term Relief)            Interpretative annotation: Clinically possible results. Good relief. No permanent benefit expected. Inflammation plays a part in the etiology to the pain.          Current benefits: Defined as reported results that persistent at this point in time.   Analgesia: 0 %            Function: Back to baseline ROM: Back to baseline Interpretative annotation: Recurrence of symptoms. Limited therapeutic benefit. Results would suggest persistent aggravating factors.          Interpretation: Results would suggest failure of therapy in achieving desired goal(s).                  Plan:  Please see "Plan of Care" for details.                Meds   Current Outpatient Medications:  .  Ascorbic Acid (VITAMIN C) 100 MG tablet, Take by mouth., Disp: , Rfl:  .  Blood Glucose Monitoring Suppl (CONTOUR NEXT EZ MONITOR) w/Device KIT, CHECK BLOOD SUGAR TWICE A DAY, Disp: , Rfl: 0 .  BREO ELLIPTA 100-25 MCG/INH AEPB, INHALE 1 PUFF  BY MOUTH DAILY, Disp: , Rfl: 7 .  furosemide (LASIX) 20 MG tablet, Take 20 mg by mouth daily., Disp: , Rfl: 5 .  gabapentin (NEURONTIN) 300 MG capsule, Take 1 capsule (300 mg total) by mouth at bedtime., Disp: 30 capsule, Rfl: 5 .  glucose blood (BAYER CONTOUR NEXT TEST) test strip, CHECK BLOOD SUGAR TWICE A DAY, Disp: , Rfl: 6 .  ibuprofen (ADVIL,MOTRIN) 200 MG tablet, Take 200 mg by mouth 6 (six) times daily., Disp: , Rfl:  .  KLOR-CON 10 10 MEQ tablet, Take 10 mEq by mouth daily., Disp: , Rfl: 4 .  metFORMIN (GLUCOPHAGE) 500 MG tablet, Take 500 mg by mouth 2 (two) times daily., Disp: , Rfl: 12 .  MICROLET LANCETS MISC, CHECK BLOOD SUGAR TWICE A DAY, Disp: , Rfl: 6 .  mometasone (NASONEX) 50 MCG/ACT nasal spray, INSTILL 1 SPRAY NASALLY TWICE A DAY, Disp: , Rfl: 3 .  timolol (TIMOPTIC) 0.5 % ophthalmic solution, , Disp: , Rfl: 3 .  magnesium 30 MG tablet, Take 30 mg by mouth daily., Disp: , Rfl:  .  methylPREDNISolone (MEDROL) 4 MG TBPK tablet, Follow package instructions., Disp: 21 tablet, Rfl: 0 .  PROLENSA 0.07 % SOLN, INSTILL 1 DROP IN LEFT EYE AT BEDTIME, Disp: , Rfl: 1  ROS  Constitutional: Denies any fever or chills Gastrointestinal: No reported hemesis, hematochezia, vomiting, or acute GI distress Musculoskeletal: Denies any acute onset joint swelling, redness, loss of ROM, or weakness Neurological: No reported episodes of acute onset apraxia, aphasia, dysarthria, agnosia, amnesia, paralysis, loss of coordination, or loss of consciousness  Allergies  Ms. Enriques is allergic to morphine and related and penicillins.  PFSH  Drug: Ms. Krukowski  reports no history of drug use. Alcohol:  reports previous alcohol use. Tobacco:  reports that she has been smoking. She has never used smokeless tobacco. Medical:  has no past medical history on file. Surgical: Ms. Quast  has no past surgical history on file. Family: family history is not on file.  Constitutional Exam  General appearance:  Well nourished, well developed, and well hydrated. In no apparent acute distress Vitals:   01/06/18 1002  BP: 140/60  Pulse: 86  Resp: 18  Temp: 98.6 F (37 C)  TempSrc: Oral  SpO2: 96%  Weight: 222 lb (100.7 kg)  Height: _0  (1.6 m)   BMI Assessment: Estimated body mass index is 39.33 kg/m as calculated from the following:   Height as of this encounter: _1  (1.6 m).   Weight as of this encounter: 222 lb (100.7 kg).  BMI interpretation table: BMI level Category Range association with higher incidence of chronic pain  <18 kg/m2 Underweight   18.5-24.9 kg/m2 Ideal body weight   25-29.9 kg/m2 Overweight Increased incidence by 20%  30-34.9 kg/m2 Obese (Class I) Increased incidence by 68%  35-39.9 kg/m2 Severe obesity (Class II) Increased incidence by 136%  >40 kg/m2 Extreme obesity (Class III) Increased incidence by 254%   Patient's current BMI Ideal Body weight  Body mass index is 39.33 kg/m. Ideal body weight: 52.4 kg (115 lb 8.3 oz) Adjusted ideal body weight: 71.7 kg (158 lb 1.8 oz)   BMI Readings from Last 4 Encounters:  01/06/18 39.33 kg/m  11/22/17 39.33 kg/m  11/09/17 39.33 kg/m  10/04/17 39.33 kg/m   Wt Readings from Last 4 Encounters:  01/06/18 222 lb (100.7 kg)  11/22/17 222 lb (100.7 kg)  11/09/17 222 lb (100.7 kg)  10/04/17 222 lb (100.7 kg)  Psych/Mental status: Alert, oriented x 3 (person, place, & time)       Eyes: PERLA Respiratory: No evidence of acute respiratory distress  Cervical Spine Area Exam  Skin & Axial Inspection: No masses, redness, edema, swelling, or associated skin lesions Alignment: Symmetrical Functional ROM: Unrestricted ROM      Stability: No instability detected Muscle Tone/Strength: Functionally intact. No obvious neuro-muscular anomalies detected. Sensory (Neurological): Unimpaired Palpation: No palpable anomalies              Upper Extremity (UE) Exam    Side: Right upper extremity  Side: Left upper extremity  Skin  & Extremity Inspection: Skin color, temperature, and hair growth are WNL. No peripheral edema or cyanosis. No masses, redness, swelling, asymmetry, or associated skin lesions. No contractures.  Skin & Extremity Inspection: Skin color, temperature, and hair growth are WNL. No peripheral edema or cyanosis. No masses, redness, swelling, asymmetry, or associated skin lesions. No contractures.  Functional ROM: Unrestricted ROM          Functional ROM: Unrestricted ROM  Muscle Tone/Strength: Functionally intact. No obvious neuro-muscular anomalies detected.  Muscle Tone/Strength: Functionally intact. No obvious neuro-muscular anomalies detected.  Sensory (Neurological): Unimpaired          Sensory (Neurological): Unimpaired          Palpation: No palpable anomalies              Palpation: No palpable anomalies              Provocative Test(s):  Phalen's test: deferred Tinel's test: deferred Apley's scratch test (touch opposite shoulder):  Action 1 (Across chest): deferred Action 2 (Overhead): deferred Action 3 (LB reach): deferred   Provocative Test(s):  Phalen's test: deferred Tinel's test: deferred Apley's scratch test (touch opposite shoulder):  Action 1 (Across chest): deferred Action 2 (Overhead): deferred Action 3 (LB reach): deferred    Thoracic Spine Area Exam  Skin & Axial Inspection: No masses, redness, or swelling Alignment: Symmetrical Functional ROM: Unrestricted ROM Stability: No instability detected Muscle Tone/Strength: Functionally intact. No obvious neuro-muscular anomalies detected. Sensory (Neurological): Unimpaired Muscle strength & Tone: No palpable anomalies  Lumbar Spine Area Exam  Skin & Axial Inspection: No masses, redness, or swelling Alignment: Symmetrical Functional ROM: Decreased ROM       Stability: No instability detected Muscle Tone/Strength: Functionally intact. No obvious neuro-muscular anomalies detected. Sensory (Neurological): Dermatomal  pain pattern Palpation: No palpable anomalies       Provocative Tests: Hyperextension/rotation test: (+) due to pain. Lumbar quadrant test (Kemp's test): (+) on the left for foraminal stenosis Lateral bending test: (+) ipsilateral radicular pain, on the left. Positive for left-sided foraminal stenosis. Patrick's Maneuver: deferred today                   FABER* test: deferred today                   S-I anterior distraction/compression test: deferred today         S-I lateral compression test: deferred today         S-I Thigh-thrust test: deferred today         S-I Gaenslen's test: deferred today         *(Flexion, ABduction and External Rotation)  Gait & Posture Assessment  Ambulation: Unassisted Gait: Relatively normal for age and body habitus Posture: WNL   Lower Extremity Exam    Side: Right lower extremity  Side: Left lower extremity  Stability: No instability observed          Stability: No instability observed          Skin & Extremity Inspection: Skin color, temperature, and hair growth are WNL. No peripheral edema or cyanosis. No masses, redness, swelling, asymmetry, or associated skin lesions. No contractures.  Skin & Extremity Inspection: Skin color, temperature, and hair growth are WNL. No peripheral edema or cyanosis. No masses, redness, swelling, asymmetry, or associated skin lesions. No contractures.  Functional ROM: Unrestricted ROM                  Functional ROM: Decreased ROM for all joints of the lower extremity          Muscle Tone/Strength: Functionally intact. No obvious neuro-muscular anomalies detected.  Muscle Tone/Strength: Functionally intact. No obvious neuro-muscular anomalies detected.  Sensory (Neurological): Unimpaired        Sensory (Neurological): Dermatomal pain pattern top of foot & big toe (L5)  DTR: Patellar: deferred today Achilles: deferred today Plantar: deferred today  DTR: Patellar: deferred today Achilles:  deferred today Plantar: deferred  today  Palpation: No palpable anomalies  Palpation: No palpable anomalies   Assessment  Primary Diagnosis & Pertinent Problem List: The primary encounter diagnosis was Lumbar radiculopathy. Diagnoses of Lumbar foraminal stenosis, Diabetic polyneuropathy associated with type 2 diabetes mellitus ([place]), Chronic pain syndrome, and Lumbosacral spondylosis with radiculopathy were also pertinent to this visit.  Status Diagnosis  Persistent Persistent Persistent 1. Lumbar radiculopathy   2. Lumbar foraminal stenosis   3. Diabetic polyneuropathy associated with type 2 diabetes mellitus ([employer])   4. Chronic pain syndrome   5. Lumbosacral spondylosis with radiculopathy       Follows up status post lumbar epidural steroid injection #2.  States that she obtain better benefit after her first compared to the second one.  Continuing to endorsed left sciatica.  Reviewed patient's CT lumbar spine which shows multilevel spondylosis, advanced facet arthropathy, severe canal stenosis at L4-L5.  We discussed diagnostic lumbar facet medial branch nerve blocks along with radiofrequency ablation.  Patient states that she will consider this further but wants to hold off at this time.  Instructed the patient to start gabapentin as below, 300 mg nightly.  Given increased pain that has been responsive to p.o. steroids in the past we discussed risks and benefits of steroid taper in the context of her type 2 diabetes.  Patient would like to proceed.  Will prescribe Medrol Dosepak as below.  Plan: -Gabapentin and Medrol Dosepak as below -Consideration of lumbar facet medial branch nerve blocks at L3, L4, L5, S1 for lumbar spondylosis and facet arthropathy.  Plan of Care  Pharmacotherapy (Medications Ordered): Meds ordered this encounter  Medications  . gabapentin (NEURONTIN) 300 MG capsule    Sig: Take 1 capsule (300 mg total) by mouth at bedtime.    Dispense:  30 capsule    Refill:  5  . methylPREDNISolone (MEDROL)  4 MG TBPK tablet    Sig: Follow package instructions.    Dispense:  21 tablet    Refill:  0    Do not add to the "Automatic Refill" notification system.   Time Note: Greater than 50% of the 25 minute(s) of face-to-face time spent with Ms. Hagemeister, was spent in counseling/coordination of care regarding: Ms. Cielo primary cause of pain, the treatment plan, treatment alternatives, the risks and possible complications of proposed treatment, going over the informed consent, the results, interpretation and significance of  her recent diagnostic interventional treatment(s), realistic expectations, the goals of pain management (increased in functionality) and the need to bring and keep the BMI below 30.  Provider-requested follow-up: Return in about 5 weeks (around 02/10/2018) for Medication Management.  Future Appointments  Date Time Provider [place]  02/07/2018  9:15 AM [place], [place] [facility]  02/10/2018  9:30 AM Gillis Santa, MD [facility] None    Primary Care Physician: Cletis Athens, MD Location: [facility] Out[facility] Note by: Gillis Santa, M.D Date: 01/06/2018; Time: 11:02 AM  Patient Instructions  Methylprednisolone and gabapentin has been escribed to your pharmacy.

## 2018-01-06 NOTE — Patient Instructions (Signed)
Methylprednisolone and gabapentin has been escribed to your pharmacy.

## 2018-02-07 ENCOUNTER — Ambulatory Visit: Payer: BLUE CROSS/BLUE SHIELD | Admitting: Podiatry

## 2018-02-10 ENCOUNTER — Encounter: Payer: Self-pay | Admitting: Student in an Organized Health Care Education/Training Program

## 2018-02-10 ENCOUNTER — Other Ambulatory Visit: Payer: Self-pay

## 2018-02-10 ENCOUNTER — Ambulatory Visit
Payer: PRIVATE HEALTH INSURANCE | Attending: Student in an Organized Health Care Education/Training Program | Admitting: Student in an Organized Health Care Education/Training Program

## 2018-02-10 VITALS — BP 139/59 | HR 82 | Temp 98.1°F | Resp 18 | Ht 63.0 in | Wt 222.0 lb

## 2018-02-10 DIAGNOSIS — M79605 Pain in left leg: Secondary | ICD-10-CM | POA: Diagnosis not present

## 2018-02-10 DIAGNOSIS — M5416 Radiculopathy, lumbar region: Secondary | ICD-10-CM | POA: Insufficient documentation

## 2018-02-10 DIAGNOSIS — E1142 Type 2 diabetes mellitus with diabetic polyneuropathy: Secondary | ICD-10-CM | POA: Insufficient documentation

## 2018-02-10 DIAGNOSIS — M48061 Spinal stenosis, lumbar region without neurogenic claudication: Secondary | ICD-10-CM | POA: Diagnosis present

## 2018-02-10 DIAGNOSIS — G894 Chronic pain syndrome: Secondary | ICD-10-CM

## 2018-02-10 MED ORDER — METHYLPREDNISOLONE 4 MG PO TBPK: ORAL_TABLET | ORAL | 0 refills | Status: AC

## 2018-02-10 NOTE — Progress Notes (Signed)
Patient's Name: Courtney Henderson  MRN: [id number]  Referring Provider: Cletis Athens, MD  DOB: [birth year]  PCP: Cletis Athens, MD  DOS: 02/10/2018  Note by: Gillis Santa, MD  Service setting: Ambulatory outpatient  Specialty: Interventional Pain Management  Location: [facility] (AMB) Pain Management Facility    Patient type: Established   Primary Reason(s) for Visit: Evaluation of chronic illnesses with exacerbation, or progression (Level of risk: moderate) CC: Back Pain (lower)  HPI  Courtney Henderson is a 58 y.o. year old, female patient, who comes today for a follow-up evaluation. She has Lumbosacral spondylosis with radiculopathy; Polyarthralgia; Diabetic polyneuropathy associated with type 2 diabetes mellitus ([place]); Lumbar radiculopathy; Chronic pain syndrome; and Lumbar foraminal stenosis on their problem list. Courtney Henderson was last seen on 01/06/2018. Her primarily concern today is the Back Pain (lower)  Pain Assessment: Location: Lower Back Radiating: both buttocks and back of both legs Onset: More than a month ago Duration: Chronic pain Quality: (slicing) Severity: 7 /18 (subjective, self-reported pain score)  Note: Reported level is inconsistent with clinical observations.                         When using our objective Pain Scale, levels between 6 and 10/10 are said to belong in an emergency room, as it progressively worsens from a 6/10, described as severely limiting, requiring emergency care not usually available at an outpatient pain management facility. At a 6/10 level, communication becomes difficult and requires great effort. Assistance to reach the emergency department may be required. Facial flushing and profuse sweating along with potentially dangerous increases in heart rate and blood pressure will be evident. Effect on ADL:   Timing: Constant Modifying factors: rest, sitting BP: (!) 139/59  HR: 82  Further details on both, my assessment(s), as well as the proposed treatment plan, please  see below.  Patient follows up today endorsing worsening pain in her bilateral buttocks and posterior and lateral thighs left greater than right.  This is secondary to bilateral foraminal stenosis at L4 and L5.  Patient is status post 2 lumbar epidural steroid injections with me at L4-L5 which did not provide her with significant pain relief for an extended period of time.  Patient states that she is interested in seeing a neurosurgeon to see if there is a surgical option for her low back and left greater than right hip and posterior lateral thigh pain.  Recent Diagnostic Imaging Review   Lumbar CT wo contrast:  Results for orders placed during the hospital encounter of 06/18/17  CT LUMBAR SPINE WO CONTRAST   Narrative CLINICAL DATA:  Progressive low back pain numbness extending into the lower extremities bilaterally. Symptoms for 3 months. Chronic pain due to trauma.  EXAM: CT LUMBAR SPINE WITHOUT CONTRAST  TECHNIQUE: Multidetector CT imaging of the lumbar spine was performed without intravenous contrast administration. Multiplanar CT image reconstructions were also generated.  COMPARISON:  Lumbar spine radiographs 06/07/2017  FINDINGS: Segmentation: 5 non rib-bearing lumbar type vertebral bodies are present. The lowest fully formed vertebral body is L5.  Alignment: Grade 1 anterolisthesis at L4-5 measures 7 mm. Slight retrolisthesis is present at L1-2, L2-3, and L3-4.  Vertebrae: Chronic endplate sclerotic changes are most evident at L1-2 on the right. Vertebral body heights are maintained. No acute or healing fractures are present.  Paraspinal and other soft tissues: Mild atherosclerotic changes are present in the distal aorta and proximal iliac vessels bilaterally. There is no aneurysm.  Disc levels:  T12-L1: There is chronic loss of disc height. Endplate spurring and mild facet disease is present. No significant stenosis is present.  L1-2: Chronic loss of disc height is  present. Broad-based disc protrusion is present. Moderate facet hypertrophy is present bilaterally. This results an moderate subarticular and mild bilateral foraminal narrowing.  L2-3: Chronic loss of disc height is present. A broad-based disc protrusion is present. A vacuum disc is present moderate facet hypertrophy is noted bilaterally. Mild subarticular narrowing and moderate bilateral foraminal stenosis is present.  L3-4: A leftward disc protrusion is present. Facet hypertrophy is worse on the left. Mild left subarticular narrowing is present. Moderate foraminal stenosis is present bilaterally.  L4-5: Severe central canal stenosis is secondary to 7 mm anterolisthesis associated with severe facet hypertrophy resulting and uncovering of a broad-based disc protrusion. Moderate foraminal narrowing is evident bilaterally.  L5-S1: Moderate facet hypertrophy is present bilaterally. Disc bulges to the right without significant stenosis.  IMPRESSION: 1. Multilevel spondylosis of the lumbar spine as described. 2. Advanced facet hypertrophy with 7 mm anterolisthesis and severe central canal stenosis at L4-5, potentially impacting the L5 and S1 nerve roots bilaterally. 3. Moderate bilateral foraminal stenosis at L4-5. 4. Mild left subarticular narrowing at L3-4 with moderate bilateral foraminal stenosis. 5. Mild subarticular and moderate bilateral foraminal narrowing at L2-3. 6. Moderate subarticular and mild bilateral foraminal stenosis at L1-2   Electronically Signed   By: San Morelle M.D.   On: 06/18/2017 10:29     Lumbar DG (Complete) 4+V:  Results for orders placed during the hospital encounter of 06/07/17  DG Lumbar Spine Complete   Narrative CLINICAL DATA:  58 year old with low back pain radiating to both hips and legs. Pain for 6 weeks.  EXAM: LUMBAR SPINE - COMPLETE 4+ VIEW  COMPARISON:  None.  FINDINGS: Grade 1 anterolisthesis at L4-L5 measuring  roughly 8 mm. There is mild retrolisthesis at L2-L3 and L3-L4. Disc space narrowing at L1-L2 and L2-L3. No evidence for a pars defect. Spondylolisthesis is likely related to facet disease. There is a left hip replacement. Mild curvature in the lumbar spine. Vertebral body heights are maintained without a fracture.  IMPRESSION: Multilevel degenerative disease throughout the lumbar spine with a combination of disc and facet disease.   Electronically Signed   By: Markus Daft M.D.   On: 06/08/2017 09:05     Results for orders placed during the hospital encounter of 06/07/17  DG HIP UNILAT WITH PELVIS 2-3 VIEWS RIGHT   Narrative CLINICAL DATA:  Right hip pain  EXAM: DG HIP (WITH OR WITHOUT PELVIS) 2-3V RIGHT  COMPARISON:  None.  FINDINGS: Mild joint space narrowing and spurring in the right hip. No fracture or AVN. No bony lesion  IMPRESSION: Mild osteoarthritis right hip.   Electronically Signed   By: Franchot Gallo M.D.   On: 06/08/2017 09:05    Hip-L DG 2-3 views:  Results for orders placed during the hospital encounter of 06/07/17  DG HIP UNILAT WITH PELVIS 2-3 VIEWS LEFT   Narrative CLINICAL DATA:  Bilateral hip pain  EXAM: DG HIP (WITH OR WITHOUT PELVIS) 2-3V LEFT  COMPARISON:  None.  FINDINGS: Left hip replacement in satisfactory position and alignment. No hardware loosening or fracture.  IMPRESSION: Satisfactory left hip replacement.   Electronically Signed   By: Franchot Gallo M.D.   On: 06/08/2017 09:03      Results for orders placed in visit on 07/02/14  DG Foot Complete Right   Narrative 6 views 3  of the right foot and 3 left demonstrates osseously mature foot  severe osteoarthritic changes of Lisfranc's joints left foot. Plantar  distally oriented calcaneal heel spurs no other osseous abnormalities  identified.   Foot-L DG Complete:  Results for orders placed in visit on 07/02/14  DG Foot Complete Left   Narrative These views have  previously been transcribed     Complexity Note: Imaging results reviewed. Results shared with Ms. Asato, using Layman's terms.                         Meds   Current Outpatient Medications:  .  Ascorbic Acid (VITAMIN C) 100 MG tablet, Take by mouth., Disp: , Rfl:  .  Blood Glucose Monitoring Suppl (CONTOUR NEXT EZ MONITOR) w/Device KIT, CHECK BLOOD SUGAR TWICE A DAY, Disp: , Rfl: 0 .  BREO ELLIPTA 100-25 MCG/INH AEPB, INHALE 1 PUFF BY MOUTH DAILY, Disp: , Rfl: 7 .  furosemide (LASIX) 20 MG tablet, Take 20 mg by mouth daily., Disp: , Rfl: 5 .  gabapentin (NEURONTIN) 300 MG capsule, Take 1 capsule (300 mg total) by mouth at bedtime., Disp: 30 capsule, Rfl: 5 .  glucose blood (BAYER CONTOUR NEXT TEST) test strip, CHECK BLOOD SUGAR TWICE A DAY, Disp: , Rfl: 6 .  ibuprofen (ADVIL,MOTRIN) 200 MG tablet, Take 200 mg by mouth 6 (six) times daily., Disp: , Rfl:  .  KLOR-CON 10 10 MEQ tablet, Take 10 mEq by mouth daily., Disp: , Rfl: 4 .  metFORMIN (GLUCOPHAGE) 500 MG tablet, Take 500 mg by mouth 2 (two) times daily., Disp: , Rfl: 12 .  MICROLET LANCETS MISC, CHECK BLOOD SUGAR TWICE A DAY, Disp: , Rfl: 6 .  mometasone (NASONEX) 50 MCG/ACT nasal spray, INSTILL 1 SPRAY NASALLY TWICE A DAY, Disp: , Rfl: 3 .  magnesium 30 MG tablet, Take 30 mg by mouth daily., Disp: , Rfl:  .  methylPREDNISolone (MEDROL) 4 MG TBPK tablet, Follow package instructions., Disp: 21 tablet, Rfl: 0 .  PROLENSA 0.07 % SOLN, INSTILL 1 DROP IN LEFT EYE AT BEDTIME, Disp: , Rfl: 1 .  timolol (TIMOPTIC) 0.5 % ophthalmic solution, , Disp: , Rfl: 3  ROS  Constitutional: Denies any fever or chills Gastrointestinal: No reported hemesis, hematochezia, vomiting, or acute GI distress Musculoskeletal: Denies any acute onset joint swelling, redness, loss of ROM, or weakness Neurological: No reported episodes of acute onset apraxia, aphasia, dysarthria, agnosia, amnesia, paralysis, loss of coordination, or loss of  consciousness  Allergies  Ms. Dayhoff is allergic to morphine and related and penicillins.  PFSH  Drug: Ms. Litzinger  reports no history of drug use. Alcohol:  reports previous alcohol use. Tobacco:  reports that she has been smoking. She has never used smokeless tobacco. Medical:  has no past medical history on file. Surgical: Ms. Damewood  has no past surgical history on file. Family: family history is not on file.  Constitutional Exam  General appearance: Well nourished, well developed, and well hydrated. In no apparent acute distress Vitals:   02/10/18 0934  BP: (!) 139/59  Pulse: 82  Resp: 18  Temp: 98.1 F (36.7 C)  TempSrc: Oral  SpO2: 97%  Weight: 222 lb (100.7 kg)  Height: 5' 3"  (1.6 m)   BMI Assessment: Estimated body mass index is 39.33 kg/m as calculated from the following:   Height as of this encounter: 5' 3"  (1.6 m).   Weight as of this encounter: 222 lb (100.7 kg).  BMI interpretation table: BMI level Category Range association with higher incidence of chronic pain  <18 kg/m2 Underweight   18.5-24.9 kg/m2 Ideal body weight   25-29.9 kg/m2 Overweight Increased incidence by 20%  30-34.9 kg/m2 Obese (Class I) Increased incidence by 68%  35-39.9 kg/m2 Severe obesity (Class II) Increased incidence by 136%  >40 kg/m2 Extreme obesity (Class III) Increased incidence by 254%   Patient's current BMI Ideal Body weight  Body mass index is 39.33 kg/m. Ideal body weight: 52.4 kg (115 lb 8.3 oz) Adjusted ideal body weight: 71.7 kg (158 lb 1.8 oz)   BMI Readings from Last 4 Encounters:  02/10/18 39.33 kg/m  01/06/18 39.33 kg/m  11/22/17 39.33 kg/m  11/09/17 39.33 kg/m   Wt Readings from Last 4 Encounters:  02/10/18 222 lb (100.7 kg)  01/06/18 222 lb (100.7 kg)  11/22/17 222 lb (100.7 kg)  11/09/17 222 lb (100.7 kg)  Psych/Mental status: Alert, oriented x 3 (person, place, & time)       Eyes: PERLA Respiratory: No evidence of acute respiratory  distress  Cervical Spine Area Exam  Skin & Axial Inspection: No masses, redness, edema, swelling, or associated skin lesions Alignment: Symmetrical Functional ROM: Unrestricted ROM      Stability: No instability detected Muscle Tone/Strength: Functionally intact. No obvious neuro-muscular anomalies detected. Sensory (Neurological): Unimpaired Palpation: No palpable anomalies              Upper Extremity (UE) Exam    Side: Right upper extremity  Side: Left upper extremity  Skin & Extremity Inspection: Skin color, temperature, and hair growth are WNL. No peripheral edema or cyanosis. No masses, redness, swelling, asymmetry, or associated skin lesions. No contractures.  Skin & Extremity Inspection: Skin color, temperature, and hair growth are WNL. No peripheral edema or cyanosis. No masses, redness, swelling, asymmetry, or associated skin lesions. No contractures.  Functional ROM: Unrestricted ROM          Functional ROM: Unrestricted ROM          Muscle Tone/Strength: Functionally intact. No obvious neuro-muscular anomalies detected.  Muscle Tone/Strength: Functionally intact. No obvious neuro-muscular anomalies detected.  Sensory (Neurological): Unimpaired          Sensory (Neurological): Unimpaired          Palpation: No palpable anomalies              Palpation: No palpable anomalies              Provocative Test(s):  Phalen's test: deferred Tinel's test: deferred Apley's scratch test (touch opposite shoulder):  Action 1 (Across chest): deferred Action 2 (Overhead): deferred Action 3 (LB reach): deferred   Provocative Test(s):  Phalen's test: deferred Tinel's test: deferred Apley's scratch test (touch opposite shoulder):  Action 1 (Across chest): deferred Action 2 (Overhead): deferred Action 3 (LB reach): deferred    Thoracic Spine Area Exam  Skin & Axial Inspection: No masses, redness, or swelling Alignment: Symmetrical Functional ROM: Unrestricted ROM Stability: No  instability detected Muscle Tone/Strength: Functionally intact. No obvious neuro-muscular anomalies detected. Sensory (Neurological): Unimpaired Muscle strength & Tone: No palpable anomalies  Lumbar Spine Area Exam  Skin & Axial Inspection: No masses, redness, or swelling Alignment: Symmetrical Functional ROM: Decreased ROM affecting both sides left greater than right Stability: No instability detected Muscle Tone/Strength: Functionally intact. No obvious neuro-muscular anomalies detected. Sensory (Neurological): Dermatomal pain pattern and muscular skeletal Palpation: No palpable anomalies       Provocative Tests: Hyperextension/rotation test: (+) due to  pain. Lumbar quadrant test (Kemp's test): (+) bilateral for foraminal stenosis left than right Lateral bending test: deferred today       Patrick's Maneuver: deferred today                   FABER test: deferred today                   S-I anterior distraction/compression test: deferred today         S-I lateral compression test: deferred today         S-I Thigh-thrust test: deferred today         S-I Gaenslen's test: deferred today          Gait & Posture Assessment  Ambulation: Unassisted Gait: Relatively normal for age and body habitus Posture: WNL   Lower Extremity Exam    Side: Right lower extremity  Side: Left lower extremity  Stability: No instability observed          Stability: No instability observed          Skin & Extremity Inspection: Skin color, temperature, and hair growth are WNL. No peripheral edema or cyanosis. No masses, redness, swelling, asymmetry, or associated skin lesions. No contractures.  Skin & Extremity Inspection: Positive color changes, redness, swelling observed  Functional ROM: Unrestricted ROM                  Functional ROM: Decreased ROM for all joints of the lower extremity          Muscle Tone/Strength: Functionally intact. No obvious neuro-muscular anomalies detected.  Muscle Tone/Strength:  Functionally intact. No obvious neuro-muscular anomalies detected.  Sensory (Neurological): Unimpaired        Sensory (Neurological): Articular pain pattern        DTR: Patellar: deferred today Achilles: deferred today Plantar: deferred today  DTR: Patellar: deferred today Achilles: deferred today Plantar: deferred today  Palpation: No palpable anomalies  Palpation: No palpable anomalies  1+ pulse of left DP and PT Assessment  Primary Diagnosis & Pertinent Problem List: The primary encounter diagnosis was Lumbar radiculopathy. Diagnoses of Lower extremity pain, anterior, left, Lumbar foraminal stenosis, Diabetic polyneuropathy associated with type 2 diabetes mellitus ([place]), and Chronic pain syndrome were also pertinent to this visit.  Status Diagnosis  Having a Flare-up Having a Flare-up Having a Flare-up 1. Lumbar radiculopathy   2. Lower extremity pain, anterior, left   3. Lumbar foraminal stenosis   4. Diabetic polyneuropathy associated with type 2 diabetes mellitus ([place])   5. Chronic pain syndrome      Worsening low back pan with radiation to bilateral buttocks and L>R postero-lateral thigh region consistent with lumbar radiculopathy at L4/L5 and possibly S1. Ct Lumbar spine shows 1. Multilevel spondylosis of the lumbar spine, Advanced facet hypertrophy with 7 mm anterolisthesis and severe central canal stenosis at L4-5, potentially impacting the L5 and S1 nerve roots bilaterally , Moderate bilateral foraminal stenosis at L4-5, Mild left subarticular narrowing at L3-4 with moderate bilateral foraminal stenosis. Mild subarticular and moderate bilateral foraminal narrowing at L2-3. Moderate subarticular and mild bilateral foraminal stenosis at L1-2  Patient is status post 2 lumbar epidural steroid injections at left L4-L5 without any significant improvement in her radicular pain.  I did discuss possible spinal cord stimulation with the patient however given her morbid obesity and type 2  diabetes I have possible reservations about this.  Patient was also somewhat hesitant about an implant.  Patient states that she  would like to see neurosurgery to discuss possible surgical options for her radiating lumbar pain.  I will send the patient to Dr. Cari Caraway for consultation.  Patient also endorses swelling, color change and occasional decrease in temperature of her left lower extremity primarily between her left calf and left wrist.  Dorsalis pedis and posterior tibial 1+ on the left.  This could be a vascular issue.  Patient does have a history of smoking.  Will refer the patient for vascular surgery for potential ABI and work-up of her left lower extremity pain that could be possibly ischemic in nature.  Prescription for steroid taper as below as well.  Plan of Care  Pharmacotherapy (Medications Ordered): Meds ordered this encounter  Medications  . methylPREDNISolone (MEDROL) 4 MG TBPK tablet    Sig: Follow package instructions.    Dispense:  21 tablet    Refill:  0    Do not add to the "Automatic Refill" notification system.   Lab-work, procedure(s), and/or referral(s): Orders Placed This Encounter  Procedures  . Ambulatory referral to Neurosurgery  . Ambulatory referral to Vascular Surgery   Time Note: Greater than 50% of the 25 minute(s) of face-to-face time spent with Ms. Tonche, was spent in counseling/coordination of care regarding: Ms. Fredricksen primary cause of pain, the treatment plan, treatment alternatives, realistic expectations and the goals of pain management (increased in functionality).  Provider-requested follow-up: Return if symptoms worsen or fail to improve.  Future Appointments  Date Time Provider [place]  02/21/2018  9:15 AM Garrel Ridgel, [place] [facility]    Primary Care Physician: Cletis Athens, MD Location: [facility] Note by: Gillis Santa, M.D Date: 02/10/2018; Time: 10:49 AM  Patient  Instructions  A prescription for Medrol Dosepack was sent to your pharmacy.

## 2018-02-10 NOTE — Patient Instructions (Signed)
A prescription for Medrol Dosepack was sent to your pharmacy. 

## 2018-02-10 NOTE — Progress Notes (Signed)
Safety precautions to be maintained throughout the outpatient stay will include: orient to surroundings, keep bed in low position, maintain call bell within reach at all times, provide assistance with transfer out of bed and ambulation.  

## 2018-02-21 ENCOUNTER — Ambulatory Visit: Payer: PRIVATE HEALTH INSURANCE | Admitting: Podiatry

## 2018-02-21 ENCOUNTER — Encounter: Payer: Self-pay | Admitting: Podiatry

## 2018-02-21 DIAGNOSIS — M722 Plantar fascial fibromatosis: Secondary | ICD-10-CM | POA: Diagnosis not present

## 2018-02-21 NOTE — Progress Notes (Signed)
She presents today for follow-up of her bilateral plantar fasciitis.  She states that is tolerable but is been starting to bother me more lately.  States that my legs have been swollen.  Objective: Vital signs are stable she alert and oriented x3.  Pulses are palpable bilateral.  Legs are hard swollen and mildly red but they are not warm to the touch.  She has mild tenderness on palpation medial calcaneal tubercles bilateral.  Assessment: Venous insufficiency.  Plantar fasciitis chronic.  Plan: Injected 2 mg of Kenalog point of maximal tenderness with local anesthetic.  Tolerated procedure well.  She is going to follow-up with Dr. do for venous studies.

## 2018-03-09 ENCOUNTER — Ambulatory Visit (INDEPENDENT_AMBULATORY_CARE_PROVIDER_SITE_OTHER): Payer: PRIVATE HEALTH INSURANCE | Admitting: Vascular Surgery

## 2018-03-09 ENCOUNTER — Ambulatory Visit (INDEPENDENT_AMBULATORY_CARE_PROVIDER_SITE_OTHER): Payer: PRIVATE HEALTH INSURANCE

## 2018-03-09 ENCOUNTER — Other Ambulatory Visit (INDEPENDENT_AMBULATORY_CARE_PROVIDER_SITE_OTHER): Payer: Self-pay | Admitting: Vascular Surgery

## 2018-03-09 ENCOUNTER — Encounter (INDEPENDENT_AMBULATORY_CARE_PROVIDER_SITE_OTHER): Payer: Self-pay | Admitting: Vascular Surgery

## 2018-03-09 ENCOUNTER — Other Ambulatory Visit: Payer: Self-pay

## 2018-03-09 VITALS — BP 146/72 | HR 85 | Resp 14 | Ht 63.0 in | Wt 222.0 lb

## 2018-03-09 DIAGNOSIS — R600 Localized edema: Secondary | ICD-10-CM

## 2018-03-09 DIAGNOSIS — M79605 Pain in left leg: Secondary | ICD-10-CM | POA: Diagnosis not present

## 2018-03-09 DIAGNOSIS — M4727 Other spondylosis with radiculopathy, lumbosacral region: Secondary | ICD-10-CM

## 2018-03-09 DIAGNOSIS — E1142 Type 2 diabetes mellitus with diabetic polyneuropathy: Secondary | ICD-10-CM

## 2018-03-09 NOTE — Progress Notes (Signed)
Subjective:    Patient ID: Courtney Henderson, female    DOB: 12/31/1960, 58 y.o.   MRN: [id number] Chief Complaint  Patient presents with  . New Patient (Initial Visit)   Presents as a new patient referred by Dr. Holley Raring for bilateral lower extremity pain.  The patient endorses a longstanding history of degenerative joint disease to her spine.  The patient mentions she possibly will need surgery moving forward.  Patient experiences bilateral lower extremity pain with and without activity.  Patient notes that this pain has progressively worsened over the last few years.  Patient denies rest pain or ulcer formation to the bilateral feet.  The patient underwent a bilateral ABI which was notable for: Right: 1.17, triphasic tibial waveforms and normal great toe waveforms.  No evidence of significant right lower extremity arterial disease. Left: 1.05, triphasic tibial waveforms and normal great toe waveforms.  No evidence of significant left lower extremity arterial disease. The patient also notes that she experiences swelling to the bilateral extremities.  The patient notes that the swelling is associated with discomfort as well.  The patient's skin will become erythematous with the swelling.  At this time, the patient does not engage in conservative therapy including wearing medical grade 1 compression socks and elevating her legs.  We had a long discussion about implementing conservative therapy on a daily basis.  We also discussed possibly undergoing a bilateral lower extremity venous duplex to rule out any contributing venous versus lymphatic disease.  At this time, the patient is not interested in moving forward with this test however if she changes her mind she will call the office.  Patient denies any fever, nausea vomiting.  Review of Systems  Constitutional: Negative.   HENT: Negative.   Eyes: Negative.   Respiratory: Negative.   Cardiovascular: Positive for leg swelling.       Bilateral lower  extremity pain  Gastrointestinal: Negative.   Endocrine: Negative.   Genitourinary: Negative.   Musculoskeletal: Positive for back pain.  Skin: Positive for color change.  Allergic/Immunologic: Negative.   Neurological: Negative.   Hematological: Negative.   Psychiatric/Behavioral: Negative.       Objective:   Physical Exam Vitals signs reviewed.  Constitutional:      Appearance: Normal appearance. She is obese.  HENT:     Head: Normocephalic and atraumatic.     Right Ear: External ear normal.     Left Ear: External ear normal.     Nose: Nose normal.     Mouth/Throat:     Mouth: Mucous membranes are dry.     Pharynx: Oropharynx is clear.  Eyes:     Extraocular Movements: Extraocular movements intact.     Conjunctiva/sclera: Conjunctivae normal.     Pupils: Pupils are equal, round, and reactive to light.  Neck:     Musculoskeletal: Normal range of motion.  Cardiovascular:     Rate and Rhythm: Normal rate and regular rhythm.     Comments: Hard to palpate pedal pulses however the bilateral feet are warm and there is a good toe capillary refill. Pulmonary:     Effort: Pulmonary effort is normal.     Breath sounds: Normal breath sounds.  Musculoskeletal: Normal range of motion.        General: Swelling (Mild to moderate bilateral lower extremity edema.) present.  Skin:    General: Skin is warm and dry.     Comments: There is no cellulitis, fibrosis or active ulcerations noted to the bilateral legs  at this time.  Neurological:     General: No focal deficit present.     Mental Status: She is alert and oriented to person, place, and time. Mental status is at baseline.  Psychiatric:        Mood and Affect: Mood normal.        Behavior: Behavior normal.        Thought Content: Thought content normal.        Judgment: Judgment normal.    BP (!) 146/72 (BP Location: Left Arm, Patient Position: Sitting, Cuff Size: Large)   Pulse 85   Resp 14   Ht 5' 3" (1.6 m)   Wt 222 lb  (100.7 kg)   LMP  (LMP Unknown)   BMI 39.33 kg/m   No past medical history on file.  Social History   Socioeconomic History  . Marital status: Married    Spouse name: Not on file  . Number of children: Not on file  . Years of education: Not on file  . Highest education level: Not on file  Occupational History  . Not on file  Social Needs  . Financial resource strain: Not on file  . Food insecurity:    Worry: Not on file    Inability: Not on file  . Transportation needs:    Medical: Not on file    Non-medical: Not on file  Tobacco Use  . Smoking status: Current Every Day Smoker  . Smokeless tobacco: Never Used  Substance and Sexual Activity  . Alcohol use: Not Currently    Alcohol/week: 0.0 standard drinks  . Drug use: Never  . Sexual activity: Not on file  Lifestyle  . Physical activity:    Days per week: Not on file    Minutes per session: Not on file  . Stress: Not on file  Relationships  . Social connections:    Talks on phone: Not on file    Gets together: Not on file    Attends religious service: Not on file    Active member of club or organization: Not on file    Attends meetings of clubs or organizations: Not on file    Relationship status: Not on file  . Intimate partner violence:    Fear of current or ex partner: Not on file    Emotionally abused: Not on file    Physically abused: Not on file    Forced sexual activity: Not on file  Other Topics Concern  . Not on file  Social History Narrative  . Not on file   No past surgical history on file.  No family history on file.  Allergies  Allergen Reactions  . Morphine And Related Swelling  . Penicillins Swelling      Assessment & Plan:  Presents as a new patient referred by Dr. Holley Raring for bilateral lower extremity pain.  The patient endorses a longstanding history of degenerative joint disease to her spine.  The patient mentions she possibly will need surgery moving forward.  Patient experiences  bilateral lower extremity pain with and without activity.  Patient notes that this pain has progressively worsened over the last few years.  Patient denies rest pain or ulcer formation to the bilateral feet.  The patient underwent a bilateral ABI which was notable for: Right: 1.17, triphasic tibial waveforms and normal great toe waveforms.  No evidence of significant right lower extremity arterial disease. Left: 1.05, triphasic tibial waveforms and normal great toe waveforms.  No evidence of significant  left lower extremity arterial disease. The patient also notes that she experiences swelling to the bilateral extremities.  The patient notes that the swelling is associated with discomfort as well.  The patient's skin will become erythematous with the swelling.  At this time, the patient does not engage in conservative therapy including wearing medical grade 1 compression socks and elevating her legs.  We had a long discussion about implementing conservative therapy on a daily basis.  We also discussed possibly undergoing a bilateral lower extremity venous duplex to rule out any contributing venous versus lymphatic disease.  At this time, the patient is not interested in moving forward with this test however if she changes her mind she will call the office.  Patient denies any fever, nausea vomiting.  1. Bilateral lower extremity edema - New The patient was encouraged to wear graduated compression stockings (20-30 mmHg) on a daily basis. The patient was instructed to begin wearing the stockings first thing in the morning and removing them in the evening. The patient was instructed specifically not to sleep in the stockings. Prescription given. In addition, behavioral modification including elevation during the day will be initiated. Encourage the patient to undergo bilateral lower extremity venous duplex to rule out any contributing venous versus lymphatic disease. At this time, the patient is not interested  in moving forward with this ultrasound. If the patient changes her mind, she should call her our office to set up an appointment Information on compression stockings and elevation was given to the patient. The patient was instructed to call the office in the interim if any worsening edema or ulcerations to the legs, feet or toes occurs. The patient expresses their understanding.  - VAS [place] LOWER EXTREMITY VENOUS REFLUX; Future  2. Diabetic polyneuropathy associated with type 2 diabetes mellitus (HCC) - Stable Encouraged good control as its slows the progression of atherosclerotic disease  3. Lumbosacral spondylosis with radiculopathy - Stable Patient with normal ABIs notable for triphasic tibial waveforms with normal great toe waveforms to the bilateral legs. No hemodynamically significant peripheral artery disease was found on today's ultrasound. The patient's degenerative joint disease to her lumbar spine is most likely a major contributing factor to her bilateral lower extremity discomfort The patient also does have edema to the bilateral legs.  I did have a long discussion with her about the importance of controlling this edema and how it does affect the health of her legs and would improve her discomfort. The patient expresses her understanding.  Patient would like to follow-up PRN  Current Outpatient Medications on File Prior to Visit  Medication Sig Dispense Refill  . Ascorbic Acid (VITAMIN C) 100 MG tablet Take by mouth.    . Blood Glucose Monitoring Suppl (CONTOUR NEXT EZ MONITOR) w/Device KIT CHECK BLOOD SUGAR TWICE A DAY  0  . BREO ELLIPTA 100-25 MCG/INH AEPB INHALE 1 PUFF BY MOUTH DAILY  7  . furosemide (LASIX) 20 MG tablet Take 20 mg by mouth daily. Patient only takes this when she isnt leaving her house.  5  . gabapentin (NEURONTIN) 300 MG capsule Take 1 capsule (300 mg total) by mouth at bedtime. 30 capsule 5  . glucose blood (BAYER CONTOUR NEXT TEST) test strip CHECK BLOOD  SUGAR TWICE A DAY  6  . ibuprofen (ADVIL,MOTRIN) 200 MG tablet Take 200 mg by mouth 6 (six) times daily.    Marland Kitchen KLOR-CON 10 10 MEQ tablet Take 10 mEq by mouth daily.  4  . magnesium 30 MG  tablet Take 30 mg by mouth daily.    . metFORMIN (GLUCOPHAGE) 500 MG tablet Take 500 mg by mouth 2 (two) times daily.  12  . MICROLET LANCETS MISC CHECK BLOOD SUGAR TWICE A DAY  6  . mometasone (NASONEX) 50 MCG/ACT nasal spray INSTILL 1 SPRAY NASALLY TWICE A DAY  3  . PROLENSA 0.07 % SOLN INSTILL 1 DROP IN LEFT EYE AT BEDTIME  1  . timolol (TIMOPTIC) 0.5 % ophthalmic solution   3   No current facility-administered medications on file prior to visit.    There are no Patient Instructions on file for this visit. No follow-ups on file.  Seng Larch A Aida Lemaire, PA-C

## 2018-03-17 ENCOUNTER — Other Ambulatory Visit: Payer: Self-pay

## 2018-03-17 ENCOUNTER — Ambulatory Visit
Payer: PRIVATE HEALTH INSURANCE | Attending: Student in an Organized Health Care Education/Training Program | Admitting: Student in an Organized Health Care Education/Training Program

## 2018-03-17 ENCOUNTER — Encounter: Payer: Self-pay | Admitting: Student in an Organized Health Care Education/Training Program

## 2018-03-17 VITALS — BP 138/65 | HR 79 | Temp 98.2°F | Resp 18 | Ht 63.0 in | Wt 222.0 lb

## 2018-03-17 DIAGNOSIS — G894 Chronic pain syndrome: Secondary | ICD-10-CM | POA: Insufficient documentation

## 2018-03-17 DIAGNOSIS — M5416 Radiculopathy, lumbar region: Secondary | ICD-10-CM | POA: Diagnosis present

## 2018-03-17 DIAGNOSIS — M48061 Spinal stenosis, lumbar region without neurogenic claudication: Secondary | ICD-10-CM | POA: Diagnosis present

## 2018-03-17 DIAGNOSIS — M4727 Other spondylosis with radiculopathy, lumbosacral region: Secondary | ICD-10-CM | POA: Diagnosis present

## 2018-03-17 DIAGNOSIS — E1142 Type 2 diabetes mellitus with diabetic polyneuropathy: Secondary | ICD-10-CM | POA: Diagnosis present

## 2018-03-17 DIAGNOSIS — M47816 Spondylosis without myelopathy or radiculopathy, lumbar region: Secondary | ICD-10-CM | POA: Insufficient documentation

## 2018-03-17 MED ORDER — GABAPENTIN 300 MG PO CAPS: 300.0000 mg | ORAL_CAPSULE | Freq: Every day | ORAL | 5 refills | Status: DC

## 2018-03-17 MED ORDER — METHYLPREDNISOLONE 4 MG PO TBPK: ORAL_TABLET | ORAL | 1 refills | Status: AC

## 2018-03-17 NOTE — Progress Notes (Signed)
Safety precautions to be maintained throughout the outpatient stay will include: orient to surroundings, keep bed in low position, maintain call bell within reach at all times, provide assistance with transfer out of bed and ambulation.  

## 2018-03-17 NOTE — Progress Notes (Signed)
Patient's Name: Courtney Henderson  MRN: [id number]  Referring Provider: Cletis Athens, MD  DOB: 1960-04-21  PCP: Courtney Athens, MD  DOS: 03/17/2018  Note by: Courtney Santa, MD  Service setting: Ambulatory outpatient  Specialty: Interventional Henderson Management  Location: [facility] (AMB) Henderson Management Facility    Patient type: Established   Primary Reason(s) for Visit: Encounter for prescription drug management. (Level of risk: moderate)  CC: Back Henderson (lower)  HPI  Courtney Henderson is a 58 y.o. year old, female patient, who comes today for a medication management evaluation. She has Lumbosacral spondylosis with radiculopathy; Polyarthralgia; Diabetic polyneuropathy associated with type 2 diabetes mellitus (Courtney Henderson); Lumbar radiculopathy; Chronic Henderson syndrome; Lumbar foraminal stenosis; Bilateral lower extremity edema; Lumbar facet arthropathy; and Lumbar spondylosis on their problem list. Her primarily concern today is the Back Henderson (lower)  Henderson Assessment: Location: Lower Back Radiating: both buttocks and back of thighs bilat Onset: More than a month ago Duration: Chronic Henderson Quality: ("slicing") Severity: 7 /10 (subjective, self-reported Henderson score)  Note: Reported level is inconsistent with clinical observations.                         When using our objective Henderson Scale, levels between 6 and 10/10 are said to belong in an emergency room, as it progressively worsens from a 6/10, described as severely limiting, requiring emergency care not usually available at an outpatient Henderson management facility. At a 6/10 level, communication becomes difficult and requires great effort. Assistance to reach the emergency department may be required. Facial flushing and profuse sweating along with potentially dangerous increases in heart rate and blood pressure will be evident. Effect on ADL:   Timing: Constant Modifying factors: sitting BP: 138/65  HR: 79  Courtney Henderson was last scheduled for an appointment on 02/10/2018  for medication management. During today's appointment we reviewed Courtney Henderson's chronic Henderson status, as well as her outpatient medication regimen.   Patient follows up today for medication management.  She would like to increase her gabapentin.  She also had a chance to see [facility] neurosurgery PA, Courtney Henderson.  Patient states that she wants to hold off on surgery at this point since it seems very extensive.  She also wants to discuss lumbar facet blocks.  She states that she is going through a difficult time with her family and may need to go see her daughter here soon.  Not on any opioid therapy.  The patient  reports no history of drug use. Her body mass index is 39.33 kg/m.  Further details on both, my assessment(s), as well as the proposed treatment plan, please see below.   Recent Diagnostic Imaging Results  VAS [place] ABI WITH/WO TBI LOWER EXTREMITY DOPPLER STUDY  Indications: Rest Henderson.   Performing Technologist: Courtney Henderson    Examination Guidelines: A complete evaluation includes at minimum, Doppler waveform signals and systolic blood pressure reading at the level of bilateral brachial, anterior tibial, and posterior tibial arteries, when vessel segments are accessible. Bilateral testing is considered an integral part of a complete examination. Photoelectric Plethysmograph (PPG) waveforms and toe systolic pressure readings are included as required and additional duplex testing as needed. Limited examinations for reoccurring indications may be performed as noted.    ABI Findings: +---------+------------------+-----+---------+--------+ Right    Rt Pressure (mmHg)IndexWaveform Comment  +---------+------------------+-----+---------+--------+ Brachial 150                                      +---------+------------------+-----+---------+--------+  ATA      174               1.16 triphasic         +---------+------------------+-----+---------+--------+ PTA       175               1.17 triphasic         +---------+------------------+-----+---------+--------+ Great Toe128               0.85 Normal            +---------+------------------+-----+---------+--------+  +---------+------------------+-----+---------+-------+ Left     Lt Pressure (mmHg)IndexWaveform Comment +---------+------------------+-----+---------+-------+ Brachial 147                                     +---------+------------------+-----+---------+-------+ ATA      158               1.05 triphasic        +---------+------------------+-----+---------+-------+ PTA      158               1.05 triphasic        +---------+------------------+-----+---------+-------+ Great Toe123               0.82 Normal           +---------+------------------+-----+---------+-------+  +-------+-----------+-----------+------------+------------+ ABI/TBIToday's ABIToday's TBIPrevious ABIPrevious TBI +-------+-----------+-----------+------------+------------+ Right  1.17       .85                                 +-------+-----------+-----------+------------+------------+ Left   1.05       .82                                 +-------+-----------+-----------+------------+------------+    Summary: Right: Resting right ankle-brachial index is within normal range. No evidence of significant right lower extremity arterial disease. The right toe-brachial index is normal.  Left: Resting left ankle-brachial index is within normal range. No evidence of significant left lower extremity arterial disease. The left toe-brachial index is normal.    *See table(s) above for measurements and observations.    Electronically signed by Courtney Pain MD on 03/11/2018 at 5:06:37 PM.    Final    Complexity Note: Imaging results reviewed. Results shared with [name], using Layman's terms.                         Meds   Current Outpatient Medications:  .   Ascorbic Acid (VITAMIN C) 100 MG tablet, Take by mouth., Disp: , Rfl:  .  Blood Glucose Monitoring Suppl (CONTOUR NEXT EZ MONITOR) w/Device KIT, CHECK BLOOD SUGAR TWICE A DAY, Disp: , Rfl: 0 .  BREO ELLIPTA 100-25 MCG/INH AEPB, INHALE 1 PUFF BY MOUTH DAILY, Disp: , Rfl: 7 .  furosemide (LASIX) 20 MG tablet, Take 20 mg by mouth daily. Patient only takes this when she isnt leaving her house., Disp: , Rfl: 5 .  gabapentin (NEURONTIN) 300 MG capsule, Take 1 capsule (300 mg total) by mouth at bedtime., Disp: 30 capsule, Rfl: 5 .  glucose blood (BAYER CONTOUR NEXT TEST) test strip, CHECK BLOOD SUGAR TWICE A DAY, Disp: , Rfl: 6 .  ibuprofen (ADVIL,MOTRIN) 200 MG tablet, Take 200 mg by mouth 6 (six) times  daily., Disp: , Rfl:  .  KLOR-CON 10 10 MEQ tablet, Take 10 mEq by mouth daily., Disp: , Rfl: 4 .  magnesium 30 MG tablet, Take 30 mg by mouth daily., Disp: , Rfl:  .  metFORMIN (GLUCOPHAGE) 500 MG tablet, Take 500 mg by mouth 2 (two) times daily., Disp: , Rfl: 12 .  methylPREDNISolone (MEDROL) 4 MG TBPK tablet, Follow package instructions., Disp: 21 tablet, Rfl: 1 .  MICROLET LANCETS MISC, CHECK BLOOD SUGAR TWICE A DAY, Disp: , Rfl: 6 .  mometasone (NASONEX) 50 MCG/ACT nasal spray, INSTILL 1 SPRAY NASALLY TWICE A DAY, Disp: , Rfl: 3 .  PROLENSA 0.07 % SOLN, INSTILL 1 DROP IN LEFT EYE AT BEDTIME, Disp: , Rfl: 1 .  timolol (TIMOPTIC) 0.5 % ophthalmic solution, , Disp: , Rfl: 3  ROS  Constitutional: Denies any fever or chills Gastrointestinal: No reported hemesis, hematochezia, vomiting, or acute GI distress Musculoskeletal: Denies any acute onset joint swelling, redness, loss of ROM, or weakness Neurological: No reported episodes of acute onset apraxia, aphasia, dysarthria, agnosia, amnesia, paralysis, loss of coordination, or loss of consciousness  Allergies  Ms. Markwood is allergic to morphine and related and penicillins.  PFSH  Drug: Ms. Mexicano  reports no history of drug use. Alcohol:  reports  previous alcohol use. Tobacco:  reports that she has been smoking. She has never used smokeless tobacco. Medical:  has a past medical history of Diabetes mellitus without complication ([place]). Surgical: Ms. Michelle  has no past surgical history on file. Family: family history is not on file.  Constitutional Exam  General appearance: Well nourished, well developed, and well hydrated. In no apparent acute distress Vitals:   03/17/18 1020  BP: 138/65  Pulse: 79  Resp: 18  Temp: 98.2 F (36.8 C)  TempSrc: Oral  SpO2: 97%  Weight: 222 lb (100.7 kg)  Height: _0  (1.6 m)   BMI Assessment: Estimated body mass index is 39.33 kg/m as calculated from the following:   Height as of this encounter: _1  (1.6 m).   Weight as of this encounter: 222 lb (100.7 kg).  BMI interpretation table: BMI level Category Range association with higher incidence of chronic Henderson  <18 kg/m2 Underweight   18.5-24.9 kg/m2 Ideal body weight   25-29.9 kg/m2 Overweight Increased incidence by 20%  30-34.9 kg/m2 Obese (Class I) Increased incidence by 68%  35-39.9 kg/m2 Severe obesity (Class II) Increased incidence by 136%  >40 kg/m2 Extreme obesity (Class III) Increased incidence by 254%   Patient's current BMI Ideal Body weight  Body mass index is 39.33 kg/m. Ideal body weight: 52.4 kg (115 lb 8.3 oz) Adjusted ideal body weight: 71.7 kg (158 lb 1.8 oz)   BMI Readings from Last 4 Encounters:  03/17/18 39.33 kg/m  03/09/18 39.33 kg/m  02/10/18 39.33 kg/m  01/06/18 39.33 kg/m   Wt Readings from Last 4 Encounters:  03/17/18 222 lb (100.7 kg)  03/09/18 222 lb (100.7 kg)  02/10/18 222 lb (100.7 kg)  01/06/18 222 lb (100.7 kg)  Psych/Mental status: Alert, oriented x 3 (person, place, & time)       Eyes: PERLA Respiratory: No evidence of acute respiratory distress  Cervical Spine Area Exam  Skin & Axial Inspection: No masses, redness, edema, swelling, or associated skin lesions Alignment:  Symmetrical Functional ROM: Unrestricted ROM      Stability: No instability detected Muscle Tone/Strength: Functionally intact. No obvious neuro-muscular anomalies detected. Sensory (Neurological): Unimpaired Palpation: No palpable anomalies  Upper Extremity (UE) Exam    Side: Right upper extremity  Side: Left upper extremity   Skin & Extremity Inspection: Skin color, temperature, and hair growth are WNL. No peripheral edema or cyanosis. No masses, redness, swelling, asymmetry, or associated skin lesions. No contractures.  Skin & Extremity Inspection: Skin color, temperature, and hair growth are WNL. No peripheral edema or cyanosis. No masses, redness, swelling, asymmetry, or associated skin lesions. No contractures.   Functional ROM: Unrestricted ROM          Functional ROM: Unrestricted ROM           Muscle Tone/Strength: Functionally intact. No obvious neuro-muscular anomalies detected.  Muscle Tone/Strength: Functionally intact. No obvious neuro-muscular anomalies detected.   Sensory (Neurological): Unimpaired          Sensory (Neurological): Unimpaired           Palpation: No palpable anomalies              Palpation: No palpable anomalies               Provocative Test(s):  Phalen's test: deferred Tinel's test: deferred Apley's scratch test (touch opposite shoulder):  Action 1 (Across chest): deferred Action 2 (Overhead): deferred Action 3 (LB reach): deferred   Provocative Test(s):  Phalen's test: deferred Tinel's test: deferred Apley's scratch test (touch opposite shoulder):  Action 1 (Across chest): deferred Action 2 (Overhead): deferred Action 3 (LB reach): deferred     Thoracic Spine Area Exam  Skin & Axial Inspection: No masses, redness, or swelling Alignment: Symmetrical Functional ROM: Unrestricted ROM Stability: No instability detected Muscle Tone/Strength: Functionally intact. No obvious neuro-muscular anomalies detected. Sensory  (Neurological): Unimpaired Muscle strength & Tone: No palpable anomalies  Lumbar Spine Area Exam  Skin & Axial Inspection:No masses, redness, or swelling Alignment:Symmetrical Functional FUX:NATFTDDUK ROMaffecting both sidesleft greater than right Stability:No instability detected Muscle Tone/Strength:Functionally intact. No obvious neuro-muscular anomalies detected. Sensory (Neurological):Dermatomal Henderson patternand muscular skeletal Palpation:No palpable anomalies Provocative Tests: Hyperextension/rotation test:(+)due to Henderson. Lumbar quadrant test (Kemp's test):(+)bilateral for foraminal stenosis left than right Lateral bending test:deferred today Patrick's Maneuver:deferred today FABER test:deferred today S-I anterior distraction/compression test:deferred today S-I lateral compression test:deferred today S-I Thigh-thrust test:deferred today S-I Gaenslen's test:deferred today  Gait & Posture Assessment  Ambulation: Unassisted Gait: Relatively normal for age and body habitus Posture: WNL   Lower Extremity Exam    Side: Right lower extremity  Side: Left lower extremity  Stability: No instability observed          Stability: No instability observed          Skin & Extremity Inspection: Skin color, temperature, and hair growth are WNL. No peripheral edema or cyanosis. No masses, redness, swelling, asymmetry, or associated skin lesions. No contractures.  Skin & Extremity Inspection: Positive color changes, redness, swelling observed  Functional ROM: Unrestricted ROM                  Functional ROM: Decreased ROM for all joints of the lower extremity          Muscle Tone/Strength: Functionally intact. No obvious neuro-muscular anomalies detected.  Muscle Tone/Strength: Functionally intact. No obvious neuro-muscular anomalies detected.  Sensory (Neurological): Unimpaired         Sensory (Neurological): Articular Henderson pattern        DTR: Patellar: deferred today Achilles: deferred today Plantar: deferred today  DTR: Patellar: deferred today Achilles: deferred today Plantar: deferred today  Palpation: No palpable anomalies  Palpation: No palpable anomalies  Assessment   Status Diagnosis  Persistent Persistent Persistent 1. Lumbar facet arthropathy   2. Lumbar spondylosis   3. Lumbosacral spondylosis with radiculopathy   4. Chronic Henderson syndrome   5. Lumbar radiculopathy   6. Lumbar foraminal stenosis   7. Diabetic polyneuropathy associated with type 2 diabetes mellitus ([place])      Updated Problems: Problem  Lumbar Facet Arthropathy  Lumbar Spondylosis   LAVIDA PATCH has a history of greater than 3 months of moderate to severe Henderson which is resulted in functional impairment.  The patient has tried various conservative therapeutic options such as NSAIDs, Tylenol, muscle relaxants, physical therapy which was inadequately effective.  Patient's Henderson axial with physical exam findings suggestive of facet arthropathy.  Lumbar facet medial branch nerve blocks were discussed with the patient.  Risks and benefits were reviewed.  PRN  bilateral L3, L4, L5, S1 medial branch nerve block.  In regards to medication management, refill of gabapentin as below.  Also a prescription for steroid taper when patient has Henderson flare.  Plan of Care  Pharmacotherapy (Medications Ordered): Meds ordered this encounter  Medications  . gabapentin (NEURONTIN) 300 MG capsule    Sig: Take 1 capsule (300 mg total) by mouth at bedtime.    Dispense:  30 capsule    Refill:  5  . methylPREDNISolone (MEDROL) 4 MG TBPK tablet    Sig: Follow package instructions.    Dispense:  21 tablet    Refill:  1    Do not add to the "Automatic Refill" notification system.   Lab-work, procedure(s), and/or referral(s): Orders Placed This Encounter  Procedures  . LUMBAR FACET(MEDIAL  BRANCH NERVE BLOCK) MBNB    Considering:   PRN bilateral L3, L4, L5, S1 facet medial branch nerve blocks   Time Note: Greater than 50% of the 25 minute(s) of face-to-face time spent with Ms. Jeane, was spent in counseling/coordination of care regarding: Ms. Brzozowski primary cause of Henderson, the treatment plan, treatment alternatives, the risks and possible complications of proposed treatment, going over the informed consent, the results, interpretation and significance of  her recent diagnostic interventional treatment(s), realistic expectations and the goals of Henderson management (increased in functionality).  Provider-requested follow-up: Return if symptoms worsen or fail to improve.  Future Appointments  Date Time Provider [place]  04/04/2018  9:15 AM Garrel Ridgel, DPM [facility]    Primary Care Physician: Courtney Athens, MD Location: [facility] Note by: Courtney Henderson, M.D Date: 03/17/2018; Time: 1:26 PM  Patient Instructions   Gabapentin with 5 refills and methylprednisolone have been escribed to your pharmacy.  GENERAL RISKS AND COMPLICATIONS  What are the risk, side effects and possible complications? Generally speaking, most procedures are safe.  However, with any procedure there are risks, side effects, and the possibility of complications.  The risks and complications are dependent upon the sites that are lesioned, or the type of nerve block to be performed.  The closer the procedure is to the spine, the more serious the risks are.  Great care is taken when placing the radio frequency needles, block needles or lesioning probes, but sometimes complications can occur. 1. Infection: Any time there is an injection through the skin, there is a risk of infection.  This is why sterile conditions are used for these blocks.  There are four possible types of infection. 1. Localized skin infection. 2. Central Nervous System Infection-This can be  in the form of Meningitis, which can be  deadly. 3. Epidural Infections-This can be in the form of an epidural abscess, which can cause pressure inside of the spine, causing compression of the spinal cord with subsequent paralysis. This would require an emergency surgery to decompress, and there are no guarantees that the patient would recover from the paralysis. 4. Discitis-This is an infection of the intervertebral discs.  It occurs in about 1% of discography procedures.  It is difficult to treat and it may lead to surgery.        2. Henderson: the needles have to go through skin and soft tissues, will cause soreness.       3. Damage to internal structures:  The nerves to be lesioned may be near blood vessels or    other nerves which can be potentially damaged.       4. Bleeding: Bleeding is more common if the patient is taking blood thinners such as  aspirin, Coumadin, Ticiid, Plavix, etc., or if he/she have some genetic predisposition  such as hemophilia. Bleeding into the spinal canal can cause compression of the spinal  cord with subsequent paralysis.  This would require an emergency surgery to  decompress and there are no guarantees that the patient would recover from the  paralysis.       5. Pneumothorax:  Puncturing of a lung is a possibility, every time a needle is introduced in  the area of the chest or upper back.  Pneumothorax refers to free air around the  collapsed lung(s), inside of the thoracic cavity (chest cavity).  Another two possible  complications related to a similar event would include: Hemothorax and Chylothorax.   These are variations of the Pneumothorax, where instead of air around the collapsed  lung(s), you may have blood or chyle, respectively.       6. Spinal headaches: They may occur with any procedures in the area of the spine.       7. Persistent CSF (Cerebro-Spinal Fluid) leakage: This is a rare problem, but may occur  with prolonged intrathecal or epidural catheters either  due to the formation of a fistulous  track or a dural tear.       8. Nerve damage: By working so close to the spinal cord, there is always a possibility of  nerve damage, which could be as serious as a permanent spinal cord injury with  paralysis.       9. Death:  Although rare, severe deadly allergic reactions known as "Anaphylactic  reaction" can occur to any of the medications used.      10. Worsening of the symptoms:  We can always make thing worse.  What are the chances of something like this happening? Chances of any of this occuring are extremely low.  By statistics, you have more of a chance of getting killed in a motor vehicle accident: while driving to the hospital than any of the above occurring .  Nevertheless, you should be aware that they are possibilities.  In general, it is similar to taking a shower.  Everybody knows that you can slip, hit your head and get killed.  Does that mean that you should not shower again?  Nevertheless always keep in mind that statistics do not mean anything if you happen to be on the wrong side of them.  Even if a procedure has a 1 (one) in a 1,000,000 (million) chance of going wrong, it you happen to be that one..Also, keep in mind that by statistics, you have more of  a chance of having something go wrong when taking medications.  Who should not have this procedure? If you are on a blood thinning medication (e.g. Coumadin, Plavix, see list of "Blood Thinners"), or if you have an active infection going on, you should not have the procedure.  If you are taking any blood thinners, please inform your physician.  How should I prepare for this procedure?  Do not eat or drink anything at least six hours prior to the procedure.  Bring a driver with you .  It cannot be a taxi.  Come accompanied by an adult that can drive you back, and that is strong enough to help you if your legs get weak or numb from the local anesthetic.  Take all of your medicines the  morning of the procedure with just enough water to swallow them.  If you have diabetes, make sure that you are scheduled to have your procedure done first thing in the morning, whenever possible.  If you have diabetes, take only half of your insulin dose and notify our nurse that you have done so as soon as you arrive at the clinic.  If you are diabetic, but only take blood sugar pills (oral hypoglycemic), then do not take them on the morning of your procedure.  You may take them after you have had the procedure.  Do not take aspirin or any aspirin-containing medications, at least eleven (11) days prior to the procedure.  They may prolong bleeding.  Wear loose fitting clothing that may be easy to take off and that you would not mind if it got stained with Betadine or blood.  Do not wear any jewelry or perfume  Remove any nail coloring.  It will interfere with some of our monitoring equipment.  NOTE: Remember that this is not meant to be interpreted as a complete list of all possible complications.  Unforeseen problems may occur.  BLOOD THINNERS The following drugs contain aspirin or other products, which can cause increased bleeding during surgery and should not be taken for 2 weeks prior to and 1 week after surgery.  If you should need take something for relief of minor Henderson, you may take acetaminophen which is found in Tylenol,m Datril, Anacin-3 and Panadol. It is not blood thinner. The products listed below are.  Do not take any of the products listed below in addition to any listed on your instruction sheet.  A.P.C or A.P.C with Codeine Codeine Phosphate Capsules #3 Ibuprofen Ridaura  ABC compound Congesprin Imuran rimadil  Advil Cope Indocin Robaxisal  Alka-Seltzer Effervescent Henderson Reliever and Antacid Coricidin or Coricidin-D  Indomethacin Rufen  Alka-Seltzer plus Cold Medicine Cosprin Ketoprofen S-A-C Tablets  Anacin Analgesic Tablets or Capsules Coumadin Korlgesic Salflex  Anacin  Extra Strength Analgesic tablets or capsules CP-2 Tablets Lanoril Salicylate  Anaprox Cuprimine Capsules Levenox Salocol  Anexsia-D Dalteparin Magan Salsalate  Anodynos Darvon compound Magnesium Salicylate Sine-off  Ansaid Dasin Capsules Magsal Sodium Salicylate  Anturane Depen Capsules Marnal Soma  APF Arthritis Henderson formula Dewitt's Pills Measurin Stanback  Argesic Dia-Gesic Meclofenamic Sulfinpyrazone  Arthritis Bayer Timed Release Aspirin Diclofenac Meclomen Sulindac  Arthritis Henderson formula Anacin Dicumarol Medipren Supac  Analgesic (Safety coated) Arthralgen Diffunasal Mefanamic Suprofen  Arthritis Strength Bufferin Dihydrocodeine Mepro Compound Suprol  Arthropan liquid Dopirydamole Methcarbomol with Aspirin Synalgos  ASA tablets/Enseals Disalcid Micrainin Tagament  Ascriptin Doan's Midol Talwin  Ascriptin A/D Dolene Mobidin Tanderil  Ascriptin Extra Strength Dolobid Moblgesic Ticlid  Ascriptin with Codeine Doloprin or Doloprin with  Codeine Momentum Tolectin  Asperbuf Duoprin Mono-gesic Trendar  Aspergum Duradyne Motrin or Motrin IB Triminicin  Aspirin plain, buffered or enteric coated Durasal Myochrisine Trigesic  Aspirin Suppositories Easprin Nalfon Trillsate  Aspirin with Codeine Ecotrin Regular or Extra Strength Naprosyn Uracel  Atromid-S Efficin Naproxen Ursinus  Auranofin Capsules Elmiron Neocylate Vanquish  Axotal Emagrin Norgesic Verin  Azathioprine Empirin or Empirin with Codeine Normiflo Vitamin E  Azolid Emprazil Nuprin Voltaren  Bayer Aspirin plain, buffered or children's or timed BC Tablets or powders Encaprin Orgaran Warfarin Sodium  Buff-a-Comp Enoxaparin Orudis Zorpin  Buff-a-Comp with Codeine Equegesic Os-Cal-Gesic   Buffaprin Excedrin plain, buffered or Extra Strength Oxalid   Bufferin Arthritis Strength Feldene Oxphenbutazone   Bufferin plain or Extra Strength Feldene Capsules Oxycodone with Aspirin   Bufferin with Codeine Fenoprofen Fenoprofen Pabalate or  Pabalate-SF   Buffets II Flogesic Panagesic   Buffinol plain or Extra Strength Florinal or Florinal with Codeine Panwarfarin   Buf-Tabs Flurbiprofen Penicillamine   Butalbital Compound Four-way cold tablets Penicillin   Butazolidin Fragmin Pepto-Bismol   Carbenicillin Geminisyn Percodan   Carna Arthritis Reliever Geopen Persantine   Carprofen Gold's salt Persistin   Chloramphenicol Goody's Phenylbutazone   Chloromycetin Haltrain Piroxlcam   Clmetidine heparin Plaquenil   Cllnoril Hyco-pap Ponstel   Clofibrate Hydroxy chloroquine Propoxyphen         Before stopping any of these medications, be sure to consult the physician who ordered them.  Some, such as Coumadin (Warfarin) are ordered to prevent or treat serious conditions such as "deep thrombosis", "pumonary embolisms", and other heart problems.  The amount of time that you may need off of the medication may also vary with the medication and the reason for which you were taking it.  If you are taking any of these medications, please make sure you notify your Henderson physician before you undergo any procedures.   Moderate Conscious Sedation, Adult Sedation is the use of medicines to promote relaxation and relieve discomfort and anxiety. Moderate conscious sedation is a type of sedation. Under moderate conscious sedation, you are less alert than normal, but you are still able to respond to instructions, touch, or both. Moderate conscious sedation is used during short medical and dental procedures. It is milder than deep sedation, which is a type of sedation under which you cannot be easily woken up. It is also milder than general anesthesia, which is the use of medicines to make you unconscious. Moderate conscious sedation allows you to return to your regular activities sooner. Tell a health care provider about:  Any allergies you have.  All medicines you are taking, including vitamins, herbs, eye drops, creams, and over-the-counter  medicines.  Use of steroids (by mouth or creams).  Any problems you or family members have had with sedatives and anesthetic medicines.  Any blood disorders you have.  Any surgeries you have had.  Any medical conditions you have, such as sleep apnea.  Whether you are pregnant or may be pregnant.  Any use of cigarettes, alcohol, marijuana, or street drugs. What are the risks? Generally, this is a safe procedure. However, problems may occur, including:  Getting too much medicine (oversedation).  Nausea.  Allergic reaction to medicines.  Trouble breathing. If this happens, a breathing tube may be used to help with breathing. It will be removed when you are awake and breathing on your own.  Heart trouble.  Lung trouble. What happens before the procedure? Staying hydrated Follow instructions from your health care provider about hydration, which  may include:  Up to 2 hours before the procedure - you may continue to drink clear liquids, such as water, clear fruit juice, black coffee, and plain tea. Eating and drinking restrictions Follow instructions from your health care provider about eating and drinking, which may include:  8 hours before the procedure - stop eating heavy meals or foods such as meat, fried foods, or fatty foods.  6 hours before the procedure - stop eating light meals or foods, such as toast or cereal.  6 hours before the procedure - stop drinking milk or drinks that contain milk.  2 hours before the procedure - stop drinking clear liquids. Medicine Ask your health care provider about:  Changing or stopping your regular medicines. This is especially important if you are taking diabetes medicines or blood thinners.  Taking medicines such as aspirin and ibuprofen. These medicines can thin your blood. Do not take these medicines before your procedure if your health care provider instructs you not to.  Tests and exams  You will have a physical exam.  You  may have blood tests done to show: ? How well your kidneys and liver are working. ? How well your blood can clot. General instructions  Plan to have someone take you home from the hospital or clinic.  If you will be going home right after the procedure, plan to have someone with you for 24 hours. What happens during the procedure?  An IV tube will be inserted into one of your veins.  Medicine to help you relax (sedative) will be given through the IV tube.  The medical or dental procedure will be performed. What happens after the procedure?  Your blood pressure, heart rate, breathing rate, and blood oxygen level will be monitored often until the medicines you were given have worn off.  Do not drive for 24 hours. This information is not intended to replace advice given to you by your health care provider. Make sure you discuss any questions you have with your health care provider. Document Released: 09/30/2000 Document Revised: 06/11/2015 Document Reviewed: 04/27/2015 Elsevier Interactive Patient Education  2019 [place]    Facet Blocks Patient Information  Description: The facets are joints in the spine between the vertebrae.  Like any joints in the body, facets can become irritated and painful.  Arthritis can also effect the facets.  By injecting steroids and local anesthetic in and around these joints, we can temporarily block the nerve supply to them.  Steroids act directly on irritated nerves and tissues to reduce selling and inflammation which often leads to decreased Henderson.  Facet blocks may be done anywhere along the spine from the neck to the low back depending upon the location of your Henderson.   After numbing the skin with local anesthetic (like Novocaine), a small needle is passed onto the facet joints under x-ray guidance.  You may experience a sensation of pressure while this is being done.  The entire block usually lasts about 15-25 minutes.   Conditions which may be  treated by facet blocks:   Low back/buttock Henderson  Neck/shoulder Henderson  Certain types of headaches  Preparation for the injection:  1. Do not eat any solid food or dairy products within 8 hours of your appointment. 2. You may drink clear liquid up to 3 hours before appointment.  Clear liquids include water, black coffee, juice or soda.  No milk or cream please. 3. You may take your regular medication, including Henderson medications, with a  sip of water before your appointment.  Diabetics should hold regular insulin (if taken separately) and take 1/2 normal NPH dose the morning of the procedure.  Carry some sugar containing items with you to your appointment. 4. A driver must accompany you and be prepared to drive you home after your procedure. 5. Bring all your current medications with you. 6. An IV may be inserted and sedation may be given at the discretion of the physician. 7. A blood pressure cuff, EKG and other monitors will often be applied during the procedure.  Some patients may need to have extra oxygen administered for a short period. 8. You will be asked to provide medical information, including your allergies and medications, prior to the procedure.  We must know immediately if you are taking blood thinners (like Coumadin/Warfarin) or if you are allergic to IV iodine contrast (dye).  We must know if you could possible be pregnant.  Possible side-effects:   Bleeding from needle site  Infection (rare, may require surgery)  Nerve injury (rare)  Numbness & tingling (temporary)  Difficulty urinating (rare, temporary)  Spinal headache (a headache worse with upright posture)  Light-headedness (temporary)  Henderson at injection site (serveral days)  Decreased blood pressure (rare, temporary)  Weakness in arm/leg (temporary)  Pressure sensation in back/neck (temporary)   Call if you experience:   Fever/chills associated with headache or increased back/neck Henderson  Headache  worsened by an upright position  New onset, weakness or numbness of an extremity below the injection site  Hives or difficulty breathing (go to the emergency room)  Inflammation or drainage at the injection site(s)  Severe back/neck Henderson greater than usual  New symptoms which are concerning to you  Please note:  Although the local anesthetic injected can often make your back or neck feel good for several hours after the injection, the Henderson will likely return. It takes 3-7 days for steroids to work.  You may not notice any Henderson relief for at least one week.  If effective, we will often do a series of 2-3 injections spaced 3-6 weeks apart to maximally decrease your Henderson.  After the initial series, you may be a candidate for a more permanent nerve block of the facets.  If you have any questions, please call #[phone]) [facility]

## 2018-03-17 NOTE — Patient Instructions (Signed)
Gabapentin with 5 refills and methylprednisolone have been escribed to your pharmacy.  GENERAL RISKS AND COMPLICATIONS  What are the risk, side effects and possible complications? Generally speaking, most procedures are safe.  However, with any procedure there are risks, side effects, and the possibility of complications.  The risks and complications are dependent upon the sites that are lesioned, or the type of nerve block to be performed.  The closer the procedure is to the spine, the more serious the risks are.  Great care is taken when placing the radio frequency needles, block needles or lesioning probes, but sometimes complications can occur. 1. Infection: Any time there is an injection through the skin, there is a risk of infection.  This is why sterile conditions are used for these blocks.  There are four possible types of infection. 1. Localized skin infection. 2. Central Nervous System Infection-This can be in the form of Meningitis, which can be deadly. 3. Epidural Infections-This can be in the form of an epidural abscess, which can cause pressure inside of the spine, causing compression of the spinal cord with subsequent paralysis. This would require an emergency surgery to decompress, and there are no guarantees that the patient would recover from the paralysis. 4. Discitis-This is an infection of the intervertebral discs.  It occurs in about 1% of discography procedures.  It is difficult to treat and it may lead to surgery.        2. Pain: the needles have to go through skin and soft tissues, will cause soreness.       3. Damage to internal structures:  The nerves to be lesioned may be near blood vessels or    other nerves which can be potentially damaged.       4. Bleeding: Bleeding is more common if the patient is taking blood thinners such as  aspirin, Coumadin, Ticiid, Plavix, etc., or if he/she have some genetic predisposition  such as hemophilia. Bleeding into the spinal canal can  cause compression of the spinal  cord with subsequent paralysis.  This would require an emergency surgery to  decompress and there are no guarantees that the patient would recover from the  paralysis.       5. Pneumothorax:  Puncturing of a lung is a possibility, every time a needle is introduced in  the area of the chest or upper back.  Pneumothorax refers to free air around the  collapsed lung(s), inside of the thoracic cavity (chest cavity).  Another two possible  complications related to a similar event would include: Hemothorax and Chylothorax.   These are variations of the Pneumothorax, where instead of air around the collapsed  lung(s), you may have blood or chyle, respectively.       6. Spinal headaches: They may occur with any procedures in the area of the spine.       7. Persistent CSF (Cerebro-Spinal Fluid) leakage: This is a rare problem, but may occur  with prolonged intrathecal or epidural catheters either due to the formation of a fistulous  track or a dural tear.       8. Nerve damage: By working so close to the spinal cord, there is always a possibility of  nerve damage, which could be as serious as a permanent spinal cord injury with  paralysis.       9. Death:  Although rare, severe deadly allergic reactions known as "Anaphylactic  reaction" can occur to any of the medications used.  10. Worsening of the symptoms:  We can always make thing worse.  What are the chances of something like this happening? Chances of any of this occuring are extremely low.  By statistics, you have more of a chance of getting killed in a motor vehicle accident: while driving to the hospital than any of the above occurring .  Nevertheless, you should be aware that they are possibilities.  In general, it is similar to taking a shower.  Everybody knows that you can slip, hit your head and get killed.  Does that mean that you should not shower again?  Nevertheless always keep in mind that statistics do not mean  anything if you happen to be on the wrong side of them.  Even if a procedure has a 1 (one) in a 1,000,000 (million) chance of going wrong, it you happen to be that one..Also, keep in mind that by statistics, you have more of a chance of having something go wrong when taking medications.  Who should not have this procedure? If you are on a blood thinning medication (e.g. Coumadin, Plavix, see list of "Blood Thinners"), or if you have an active infection going on, you should not have the procedure.  If you are taking any blood thinners, please inform your physician.  How should I prepare for this procedure?  Do not eat or drink anything at least six hours prior to the procedure.  Bring a driver with you .  It cannot be a taxi.  Come accompanied by an adult that can drive you back, and that is strong enough to help you if your legs get weak or numb from the local anesthetic.  Take all of your medicines the morning of the procedure with just enough water to swallow them.  If you have diabetes, make sure that you are scheduled to have your procedure done first thing in the morning, whenever possible.  If you have diabetes, take only half of your insulin dose and notify our nurse that you have done so as soon as you arrive at the clinic.  If you are diabetic, but only take blood sugar pills (oral hypoglycemic), then do not take them on the morning of your procedure.  You may take them after you have had the procedure.  Do not take aspirin or any aspirin-containing medications, at least eleven (11) days prior to the procedure.  They may prolong bleeding.  Wear loose fitting clothing that may be easy to take off and that you would not mind if it got stained with Betadine or blood.  Do not wear any jewelry or perfume  Remove any nail coloring.  It will interfere with some of our monitoring equipment.  NOTE: Remember that this is not meant to be interpreted as a complete list of all possible  complications.  Unforeseen problems may occur.  BLOOD THINNERS The following drugs contain aspirin or other products, which can cause increased bleeding during surgery and should not be taken for 2 weeks prior to and 1 week after surgery.  If you should need take something for relief of minor pain, you may take acetaminophen which is found in Tylenol,m Datril, Anacin-3 and Panadol. It is not blood thinner. The products listed below are.  Do not take any of the products listed below in addition to any listed on your instruction sheet.  A.P.C or A.P.C with Codeine Codeine Phosphate Capsules #3 Ibuprofen Ridaura  ABC compound Congesprin Imuran rimadil  Advil Cope Indocin Robaxisal  Alka-Seltzer Effervescent Pain Reliever and Antacid Coricidin or Coricidin-D  Indomethacin Rufen  Alka-Seltzer plus Cold Medicine Cosprin Ketoprofen S-A-C Tablets  Anacin Analgesic Tablets or Capsules Coumadin Korlgesic Salflex  Anacin Extra Strength Analgesic tablets or capsules CP-2 Tablets Lanoril Salicylate  Anaprox Cuprimine Capsules Levenox Salocol  Anexsia-D Dalteparin Magan Salsalate  Anodynos Darvon compound Magnesium Salicylate Sine-off  Ansaid Dasin Capsules Magsal Sodium Salicylate  Anturane Depen Capsules Marnal Soma  APF Arthritis pain formula Dewitt's Pills Measurin Stanback  Argesic Dia-Gesic Meclofenamic Sulfinpyrazone  Arthritis Bayer Timed Release Aspirin Diclofenac Meclomen Sulindac  Arthritis pain formula Anacin Dicumarol Medipren Supac  Analgesic (Safety coated) Arthralgen Diffunasal Mefanamic Suprofen  Arthritis Strength Bufferin Dihydrocodeine Mepro Compound Suprol  Arthropan liquid Dopirydamole Methcarbomol with Aspirin Synalgos  ASA tablets/Enseals Disalcid Micrainin Tagament  Ascriptin Doan's Midol Talwin  Ascriptin A/D Dolene Mobidin Tanderil  Ascriptin Extra Strength Dolobid Moblgesic Ticlid  Ascriptin with Codeine Doloprin or Doloprin with Codeine Momentum Tolectin  Asperbuf Duoprin  Mono-gesic Trendar  Aspergum Duradyne Motrin or Motrin IB Triminicin  Aspirin plain, buffered or enteric coated Durasal Myochrisine Trigesic  Aspirin Suppositories Easprin Nalfon Trillsate  Aspirin with Codeine Ecotrin Regular or Extra Strength Naprosyn Uracel  Atromid-S Efficin Naproxen Ursinus  Auranofin Capsules Elmiron Neocylate Vanquish  Axotal Emagrin Norgesic Verin  Azathioprine Empirin or Empirin with Codeine Normiflo Vitamin E  Azolid Emprazil Nuprin Voltaren  Bayer Aspirin plain, buffered or children's or timed BC Tablets or powders Encaprin Orgaran Warfarin Sodium  Buff-a-Comp Enoxaparin Orudis Zorpin  Buff-a-Comp with Codeine Equegesic Os-Cal-Gesic   Buffaprin Excedrin plain, buffered or Extra Strength Oxalid   Bufferin Arthritis Strength Feldene Oxphenbutazone   Bufferin plain or Extra Strength Feldene Capsules Oxycodone with Aspirin   Bufferin with Codeine Fenoprofen Fenoprofen Pabalate or Pabalate-SF   Buffets II Flogesic Panagesic   Buffinol plain or Extra Strength Florinal or Florinal with Codeine Panwarfarin   Buf-Tabs Flurbiprofen Penicillamine   Butalbital Compound Four-way cold tablets Penicillin   Butazolidin Fragmin Pepto-Bismol   Carbenicillin Geminisyn Percodan   Carna Arthritis Reliever Geopen Persantine   Carprofen Gold's salt Persistin   Chloramphenicol Goody's Phenylbutazone   Chloromycetin Haltrain Piroxlcam   Clmetidine heparin Plaquenil   Cllnoril Hyco-pap Ponstel   Clofibrate Hydroxy chloroquine Propoxyphen         Before stopping any of these medications, be sure to consult the physician who ordered them.  Some, such as Coumadin (Warfarin) are ordered to prevent or treat serious conditions such as "deep thrombosis", "pumonary embolisms", and other heart problems.  The amount of time that you may need off of the medication may also vary with the medication and the reason for which you were taking it.  If you are taking any of these medications, please  make sure you notify your pain physician before you undergo any procedures.   Moderate Conscious Sedation, Adult Sedation is the use of medicines to promote relaxation and relieve discomfort and anxiety. Moderate conscious sedation is a type of sedation. Under moderate conscious sedation, you are less alert than normal, but you are still able to respond to instructions, touch, or both. Moderate conscious sedation is used during short medical and dental procedures. It is milder than deep sedation, which is a type of sedation under which you cannot be easily woken up. It is also milder than general anesthesia, which is the use of medicines to make you unconscious. Moderate conscious sedation allows you to return to your regular activities sooner. Tell a health care provider about:  Any allergies  you have.  All medicines you are taking, including vitamins, herbs, eye drops, creams, and over-the-counter medicines.  Use of steroids (by mouth or creams).  Any problems you or family members have had with sedatives and anesthetic medicines.  Any blood disorders you have.  Any surgeries you have had.  Any medical conditions you have, such as sleep apnea.  Whether you are pregnant or may be pregnant.  Any use of cigarettes, alcohol, marijuana, or street drugs. What are the risks? Generally, this is a safe procedure. However, problems may occur, including:  Getting too much medicine (oversedation).  Nausea.  Allergic reaction to medicines.  Trouble breathing. If this happens, a breathing tube may be used to help with breathing. It will be removed when you are awake and breathing on your own.  Heart trouble.  Lung trouble. What happens before the procedure? Staying hydrated Follow instructions from your health care provider about hydration, which may include:  Up to 2 hours before the procedure - you may continue to drink clear liquids, such as water, clear fruit juice, black coffee,  and plain tea. Eating and drinking restrictions Follow instructions from your health care provider about eating and drinking, which may include:  8 hours before the procedure - stop eating heavy meals or foods such as meat, fried foods, or fatty foods.  6 hours before the procedure - stop eating light meals or foods, such as toast or cereal.  6 hours before the procedure - stop drinking milk or drinks that contain milk.  2 hours before the procedure - stop drinking clear liquids. Medicine Ask your health care provider about:  Changing or stopping your regular medicines. This is especially important if you are taking diabetes medicines or blood thinners.  Taking medicines such as aspirin and ibuprofen. These medicines can thin your blood. Do not take these medicines before your procedure if your health care provider instructs you not to.  Tests and exams  You will have a physical exam.  You may have blood tests done to show: ? How well your kidneys and liver are working. ? How well your blood can clot. General instructions  Plan to have someone take you home from the hospital or clinic.  If you will be going home right after the procedure, plan to have someone with you for 24 hours. What happens during the procedure?  An IV tube will be inserted into one of your veins.  Medicine to help you relax (sedative) will be given through the IV tube.  The medical or dental procedure will be performed. What happens after the procedure?  Your blood pressure, heart rate, breathing rate, and blood oxygen level will be monitored often until the medicines you were given have worn off.  Do not drive for 24 hours. This information is not intended to replace advice given to you by your health care provider. Make sure you discuss any questions you have with your health care provider. Document Released: 09/30/2000 Document Revised: 06/11/2015 Document Reviewed: 04/27/2015 [employer]vier Interactive  Patient Education  2019 [employer]    Facet Blocks Patient Information  Description: The facets are joints in the spine between the vertebrae.  Like any joints in the body, facets can become irritated and painful.  Arthritis can also effect the facets.  By injecting steroids and local anesthetic in and around these joints, we can temporarily block the nerve supply to them.  Steroids act directly on irritated nerves and tissues to reduce selling and inflammation  which often leads to decreased pain.  Facet blocks may be done anywhere along the spine from the neck to the low back depending upon the location of your pain.   After numbing the skin with local anesthetic (like Novocaine), a small needle is passed onto the facet joints under x-ray guidance.  You may experience a sensation of pressure while this is being done.  The entire block usually lasts about 15-25 minutes.   Conditions which may be treated by facet blocks:   Low back/buttock pain  Neck/shoulder pain  Certain types of headaches  Preparation for the injection:  1. Do not eat any solid food or dairy products within 8 hours of your appointment. 2. You may drink clear liquid up to 3 hours before appointment.  Clear liquids include water, black coffee, juice or soda.  No milk or cream please. 3. You may take your regular medication, including pain medications, with a sip of water before your appointment.  Diabetics should hold regular insulin (if taken separately) and take 1/2 normal NPH dose the morning of the procedure.  Carry some sugar containing items with you to your appointment. 4. A driver must accompany you and be prepared to drive you home after your procedure. 5. Bring all your current medications with you. 6. An IV may be inserted and sedation may be given at the discretion of the physician. 7. A blood pressure cuff, EKG and other monitors will often be applied during the procedure.  Some patients may need to have  extra oxygen administered for a short period. 8. You will be asked to provide medical information, including your allergies and medications, prior to the procedure.  We must know immediately if you are taking blood thinners (like Coumadin/Warfarin) or if you are allergic to IV iodine contrast (dye).  We must know if you could possible be pregnant.  Possible side-effects:   Bleeding from needle site  Infection (rare, may require surgery)  Nerve injury (rare)  Numbness & tingling (temporary)  Difficulty urinating (rare, temporary)  Spinal headache (a headache worse with upright posture)  Light-headedness (temporary)  Pain at injection site (serveral days)  Decreased blood pressure (rare, temporary)  Weakness in arm/leg (temporary)  Pressure sensation in back/neck (temporary)   Call if you experience:   Fever/chills associated with headache or increased back/neck pain  Headache worsened by an upright position  New onset, weakness or numbness of an extremity below the injection site  Hives or difficulty breathing (go to the emergency room)  Inflammation or drainage at the injection site(s)  Severe back/neck pain greater than usual  New symptoms which are concerning to you  Please note:  Although the local anesthetic injected can often make your back or neck feel good for several hours after the injection, the pain will likely return. It takes 3-7 days for steroids to work.  You may not notice any pain relief for at least one week.  If effective, we will often do a series of 2-3 injections spaced 3-6 weeks apart to maximally decrease your pain.  After the initial series, you may be a candidate for a more permanent nerve block of the facets.  If you have any questions, please call #[phone] [facility]

## 2018-04-01 ENCOUNTER — Telehealth: Payer: Self-pay | Admitting: Student in an Organized Health Care Education/Training Program

## 2018-04-01 NOTE — Telephone Encounter (Signed)
PEr Dr Cherylann Ratel, patient patient may come have her procedure on Wednesday.  Patient notified.

## 2018-04-01 NOTE — Telephone Encounter (Signed)
Patient lvmail stating she is having cortisone shots in her feet on Monday and scheduled for procedure w/ Dr. Cherylann Ratel on Wed for procedure. Will this interfere with procedure? Should she cancel cortisone shots? Please call patient and let her know. Thank you  Wed. At 10:45

## 2018-04-01 NOTE — Telephone Encounter (Signed)
Dr. Lateef, please advise. 

## 2018-04-04 ENCOUNTER — Ambulatory Visit (INDEPENDENT_AMBULATORY_CARE_PROVIDER_SITE_OTHER): Payer: PRIVATE HEALTH INSURANCE | Admitting: Podiatry

## 2018-04-04 ENCOUNTER — Encounter: Payer: Self-pay | Admitting: Podiatry

## 2018-04-04 ENCOUNTER — Other Ambulatory Visit: Payer: Self-pay

## 2018-04-04 DIAGNOSIS — M722 Plantar fascial fibromatosis: Secondary | ICD-10-CM

## 2018-04-04 NOTE — Progress Notes (Signed)
She presents today for plantar fasciitis follow-up.  Objective: Pulses are palpable.  She has pain on palpation mid calcaneal tubercles bilaterally.  Assessment: Plantar fasciitis bilateral chronic in nature.  Plan: At this point vascular studies were normal.  I injected 10 mg of Kenalog 5 mg Marcaine point of maximal tenderness bilateral heels.

## 2018-04-06 ENCOUNTER — Ambulatory Visit
Admission: RE | Admit: 2018-04-06 | Discharge: 2018-04-06 | Disposition: A | Discharge status: Home / Self Care | Payer: PRIVATE HEALTH INSURANCE | Source: Ambulatory Visit | Attending: Student in an Organized Health Care Education/Training Program | Admitting: Student in an Organized Health Care Education/Training Program

## 2018-04-06 ENCOUNTER — Encounter: Payer: Self-pay | Admitting: Student in an Organized Health Care Education/Training Program

## 2018-04-06 ENCOUNTER — Other Ambulatory Visit: Payer: Self-pay

## 2018-04-06 ENCOUNTER — Ambulatory Visit (HOSPITAL_BASED_OUTPATIENT_CLINIC_OR_DEPARTMENT_OTHER): Payer: PRIVATE HEALTH INSURANCE | Admitting: Student in an Organized Health Care Education/Training Program

## 2018-04-06 DIAGNOSIS — M47816 Spondylosis without myelopathy or radiculopathy, lumbar region: Secondary | ICD-10-CM

## 2018-04-06 MED ORDER — LIDOCAINE HCL 2 % IJ SOLN
20.0000 mL | Freq: Once | INTRAMUSCULAR | Status: AC
  Administered 2018-04-06: 400 mg
  Filled 2018-04-06: qty 20

## 2018-04-06 MED ORDER — FENTANYL CITRATE (PF) 100 MCG/2ML IJ SOLN
25.0000 ug | INTRAMUSCULAR | Status: DC | PRN
  Administered 2018-04-06: 100 ug via INTRAVENOUS
  Filled 2018-04-06: qty 2

## 2018-04-06 MED ORDER — DEXAMETHASONE SODIUM PHOSPHATE 10 MG/ML IJ SOLN
10.0000 mg | Freq: Once | INTRAMUSCULAR | Status: AC
  Administered 2018-04-06: 10 mg
  Filled 2018-04-06: qty 1

## 2018-04-06 MED ORDER — ROPIVACAINE HCL 2 MG/ML IJ SOLN
18.0000 mL | Freq: Once | INTRAMUSCULAR | Status: AC
  Administered 2018-04-06: 20 mL via PERINEURAL
  Filled 2018-04-06: qty 20

## 2018-04-06 NOTE — Patient Instructions (Signed)

## 2018-04-06 NOTE — Progress Notes (Signed)
Patient's Name: Courtney Henderson  MRN: [id number]  Referring Provider: Edward Jolly, MD  DOB: 1961-01-03  PCP: Corky Downs, MD  DOS: 04/06/2018  Note by: Edward Jolly, MD  Service setting: Ambulatory outpatient  Specialty: Interventional Pain Management  Patient type: Established  Location: [facility] (AMB) Pain Management Facility  Visit type: Interventional Procedure   Primary Reason for Visit: Interventional Pain Management Treatment. CC: Back Pain (low) and Leg Pain (bilateral, left is worse)  Procedure:          Anesthesia, Analgesia, Anxiolysis:  Type: Lumbar Facet, Medial Branch Block(s) #1  Primary Purpose: Diagnostic Region: Posterolateral Lumbosacral Spine Level:  L3, L4, L5, & S1 Medial Branch Level(s). Injecting these levels blocks the L3-4, L4-5, and L5-S1 lumbar facet joints. Laterality: Bilateral  Type: Moderate (Conscious) Sedation combined with Local Anesthesia Indication(s): Analgesia and Anxiety Route: Intravenous (IV) IV Access: Secured Sedation: Meaningful verbal contact was maintained at all times during the procedure  Local Anesthetic: Lidocaine 1-2%  Position: Prone   Indications: 1. Lumbar facet arthropathy    Pain Score: Pre-procedure: 9 /10 Post-procedure: 0-No pain/10  Pre-op Assessment:  Courtney Henderson is a 58 y.o. (year old), female patient, seen today for interventional treatment. She  has no past surgical history on file. [name] has a current medication list which includes the following prescription(s): vitamin c, contour next ez monitor, breo ellipta, furosemide, gabapentin, glipizide, glucose blood, ibuprofen, klor-con 10, latanoprost, magnesium, meloxicam, metformin, microlet lancets, mometasone, prolensa, and timolol, and the following Facility-Administered Medications: fentanyl. Her primarily concern today is the Back Pain (low) and Leg Pain (bilateral, left is worse)  Initial Vital Signs:  Pulse/HCG Rate: 82ECG Heart Rate: 75 Temp: 98.2 F (36.8  C) Resp: 18 BP: (!) 141/62 SpO2: 98 %  BMI: Estimated body mass index is 39.33 kg/m as calculated from the following:   Height as of this encounter: 5\' 3"  (1.6 m).   Weight as of this encounter: 222 lb (100.7 kg).  Risk Assessment: Allergies: Reviewed. She is allergic to morphine and related and penicillins.  Allergy Precautions: None required Coagulopathies: Reviewed. None identified.  Blood-thinner therapy: None at this time Active Infection(s): Reviewed. None identified. Courtney Henderson is afebrile  Site Confirmation: Courtney Henderson was asked to confirm the procedure and laterality before marking the site Procedure checklist: Completed Consent: Before the procedure and under the influence of no sedative(s), amnesic(s), or anxiolytics, the patient was informed of the treatment options, risks and possible complications. To fulfill our ethical and legal obligations, as recommended by the American Medical Association's Code of Ethics, I have informed the patient of my clinical impression; the nature and purpose of the treatment or procedure; the risks, benefits, and possible complications of the intervention; the alternatives, including doing nothing; the risk(s) and benefit(s) of the alternative treatment(s) or procedure(s); and the risk(s) and benefit(s) of doing nothing. The patient was provided information about the general risks and possible complications associated with the procedure. These may include, but are not limited to: failure to achieve desired goals, infection, bleeding, organ or nerve damage, allergic reactions, paralysis, and death. In addition, the patient was informed of those risks and complications associated to Spine-related procedures, such as failure to decrease pain; infection (i.e.: Meningitis, epidural or intraspinal abscess); bleeding (i.e.: epidural hematoma, subarachnoid hemorrhage, or any other type of intraspinal or peri-dural bleeding); organ or nerve damage (i.e.: Any  type of peripheral nerve, nerve root, or spinal cord injury) with subsequent damage to sensory, motor, and/or autonomic systems, resulting  in permanent pain, numbness, and/or weakness of one or several areas of the body; allergic reactions; (i.e.: anaphylactic reaction); and/or death. Furthermore, the patient was informed of those risks and complications associated with the medications. These include, but are not limited to: allergic reactions (i.e.: anaphylactic or anaphylactoid reaction(s)); adrenal axis suppression; blood sugar elevation that in diabetics may result in ketoacidosis or comma; water retention that in patients with history of congestive heart failure may result in shortness of breath, pulmonary edema, and decompensation with resultant heart failure; weight gain; swelling or edema; medication-induced neural toxicity; particulate matter embolism and blood vessel occlusion with resultant organ, and/or nervous system infarction; and/or aseptic necrosis of one or more joints. Finally, the patient was informed that Medicine is not an exact science; therefore, there is also the possibility of unforeseen or unpredictable risks and/or possible complications that may result in a catastrophic outcome. The patient indicated having understood very clearly. We have given the patient no guarantees and we have made no promises. Enough time was given to the patient to ask questions, all of which were answered to the patient's satisfaction. Courtney Henderson has indicated that she wanted to continue with the procedure. Attestation: I, the ordering provider, attest that I have discussed with the patient the benefits, risks, side-effects, alternatives, likelihood of achieving goals, and potential problems during recovery for the procedure that I have provided informed consent. Date   Time: 04/06/2018 10:23 AM  Pre-Procedure Preparation:  Monitoring: As per clinic protocol. Respiration, ETCO2, SpO2, BP, heart rate and  rhythm monitor placed and checked for adequate function Safety Precautions: Patient was assessed for positional comfort and pressure points before starting the procedure. Time-out: I initiated and conducted the "Time-out" before starting the procedure, as per protocol. The patient was asked to participate by confirming the accuracy of the "Time Out" information. Verification of the correct person, site, and procedure were performed and confirmed by me, the nursing staff, and the patient. "Time-out" conducted as per Joint Commission's Universal Protocol (UP.01.01.01). Time: 1145  Description of Procedure:          Laterality: Bilateral. The procedure was performed in identical fashion on both sides. Levels:   L3, L4, L5, & S1 Medial Branch Level(s) Area Prepped: Posterior Lumbosacral Region Prepping solution: ChloraPrep (2% chlorhexidine gluconate and 70% isopropyl alcohol) Safety Precautions: Aspiration looking for blood return was conducted prior to all injections. At no point did we inject any substances, as a needle was being advanced. Before injecting, the patient was told to immediately notify me if she was experiencing any new onset of "ringing in the ears, or metallic taste in the mouth". No attempts were made at seeking any paresthesias. Safe injection practices and needle disposal techniques used. Medications properly checked for expiration dates. SDV (single dose vial) medications used. After the completion of the procedure, all disposable equipment used was discarded in the proper designated medical waste containers. Local Anesthesia: Protocol guidelines were followed. The patient was positioned over the fluoroscopy table. The area was prepped in the usual manner. The time-out was completed. The target area was identified using fluoroscopy. A 12-in long, straight, sterile hemostat was used with fluoroscopic guidance to locate the targets for each level blocked. Once located, the skin was marked  with an approved surgical skin marker. Once all sites were marked, the skin (epidermis, dermis, and hypodermis), as well as deeper tissues (fat, connective tissue and muscle) were infiltrated with a small amount of a short-acting local anesthetic, loaded on a 10cc  syringe with a 25G, 1.5-in  Needle. An appropriate amount of time was allowed for local anesthetics to take effect before proceeding to the next step. Local Anesthetic: Lidocaine 2.0% The unused portion of the local anesthetic was discarded in the proper designated containers. Technical explanation of process:    L3 Medial Branch Nerve Block (MBB): The target area for the L3 medial branch is at the junction of the postero-lateral aspect of the superior articular process and the superior, posterior, and medial edge of the transverse process of L4. Under fluoroscopic guidance, a Quincke needle was inserted until contact was made with os over the superior postero-lateral aspect of the pedicular shadow (target area). After negative aspiration for blood, 1 mL of the nerve block solution was injected without difficulty or complication. The needle was removed intact. L4 Medial Branch Nerve Block (MBB): The target area for the L4 medial branch is at the junction of the postero-lateral aspect of the superior articular process and the superior, posterior, and medial edge of the transverse process of L5. Under fluoroscopic guidance, a Quincke needle was inserted until contact was made with os over the superior postero-lateral aspect of the pedicular shadow (target area). After negative aspiration for blood, 28mL of the nerve block solution was injected without difficulty or complication. The needle was removed intact. L5 Medial Branch Nerve Block (MBB): The target area for the L5 medial branch is at the junction of the postero-lateral aspect of the superior articular process and the superior, posterior, and medial edge of the sacral ala. Under fluoroscopic  guidance, a Quincke needle was inserted until contact was made with os over the superior postero-lateral aspect of the pedicular shadow (target area). After negative aspiration for blood, 68mL of the nerve block solution was injected without difficulty or complication. The needle was removed intact. S1 Medial Branch Nerve Block (MBB): The target area for the S1 medial branch is at the posterior and inferior 6 o'clock position of the L5-S1 facet joint. Under fluoroscopic guidance, the Quincke needle inserted for the L5 MBB was redirected until contact was made with os over the inferior and postero aspect of the sacrum, at the 6 o' clock position under the L5-S1 facet joint (Target area). After negative aspiration for blood, 50mL of the nerve block solution was injected without difficulty or complication. The needle was removed intact.  Nerve block solution: 8 cc solution made of 6 cc of 0.2% ropivacaine, 2 cc of Decadron 10 mg/cc.  1 cc injected at each level above bilaterally.  Total steroid dose equals 20 mg Decadron Procedural Needles: 22-gauge, 3.5-inch, Quincke needles used for all levels.  Once the entire procedure was completed, the treated area was cleaned, making sure to leave some of the prepping solution back to take advantage of its long term bactericidal properties.   Illustration of the posterior view of the lumbar spine and the posterior neural structures. Laminae of L2 through S1 are labeled. DPRL5, dorsal primary ramus of L5; DPRS1, dorsal primary ramus of S1; DPR3, dorsal primary ramus of L3; FJ, facet (zygapophyseal) joint L3-L4; I, inferior articular process of L4; LB1, lateral branch of dorsal primary ramus of L1; IAB, inferior articular branches from L3 medial branch (supplies L4-L5 facet joint); IBP, intermediate branch plexus; MB3, medial branch of dorsal primary ramus of L3; NR3, third lumbar nerve root; S, superior articular process of L5; SAB, superior articular branches from L4  (supplies L4-5 facet joint also); TP3, transverse process of L3.  Vitals:   04/06/18  1212 04/06/18 1219 04/06/18 1229 04/06/18 1239  BP: 138/80  137/69 137/72  Pulse:      Resp: (!) 25 (!) 21 (!) 22 20  Temp:  97.6 F (36.4 C)  97.8 F (36.6 C)  TempSrc:      SpO2: 92% 95% 96% 95%  Weight:      Height:         Start Time: 1145 hrs. End Time: 1211 hrs.  Imaging Guidance (Spinal):          Type of Imaging Technique: Fluoroscopy Guidance (Spinal) Indication(s): Assistance in needle guidance and placement for procedures requiring needle placement in or near specific anatomical locations not easily accessible without such assistance. Exposure Time: Please see nurses notes. Contrast: None used. Fluoroscopic Guidance: I was personally present during the use of fluoroscopy. "Tunnel Vision Technique" used to obtain the best possible view of the target area. Parallax error corrected before commencing the procedure. "Direction-depth-direction" technique used to introduce the needle under continuous pulsed fluoroscopy. Once target was reached, antero-posterior, oblique, and lateral fluoroscopic projection used confirm needle placement in all planes. Images permanently stored in EMR. Interpretation: No contrast injected. I personally interpreted the imaging intraoperatively. Adequate needle placement confirmed in multiple planes. Permanent images saved into the patient's record.  Antibiotic Prophylaxis:   Anti-infectives (From admission, onward)   None     Indication(s): None identified  Post-operative Assessment:  Post-procedure Vital Signs:  Pulse/HCG Rate: 8275 Temp: 97.8 F (36.6 C) Resp: 20 BP: 137/72 SpO2: 95 %  EBL: None  Complications: No immediate post-treatment complications observed by team, or reported by patient.  Note: The patient tolerated the entire procedure well. A repeat set of vitals were taken after the procedure and the patient was kept under observation  following institutional policy, for this type of procedure. Post-procedural neurological assessment was performed, showing return to baseline, prior to discharge. The patient was provided with post-procedure discharge instructions, including a section on how to identify potential problems. Should any problems arise concerning this procedure, the patient was given instructions to immediately contact us, at any time, without hesitation. In any case, we plan to contact the patient by telephone for a follow-up status report regarding this interventional procedure.  Comments:  No additional relevant information.  Plan of Care  Orders:  Orders Placed This Encounter  Procedures   DG C-Arm 1-60 Min-No Report    Intraoperative interpretation by procedural physician at [facility].    Standing Status:   Standing    Number of Occurrences:   1    Order Specific Question:   Reason for exam:    Answer:   Assistance in needle guidance and placement for procedures requiring needle placement in or near specific anatomical locations not easily accessible without such assistance.   Medications ordered for procedure: Meds ordered this encounter  Medications   lidocaine (XYLOCAINE) 2 % (with pres) injection 400 mg   fentaNYL (SUBLIMAZE) injection 25-50 mcg    Make sure Narcan is available in the pyxis when using this medication. In the event of respiratory depression (RR< 8/min): Titrate NARCAN (naloxone) in increments of 0.1 to 0.2 mg IV at 2-3 minute intervals, until desired degree of reversal.   dexamethasone (DECADRON) injection 10 mg   dexamethasone (DECADRON) injection 10 mg   ropivacaine (PF) 2 mg/mL (0.2%) (NAROPIN) injection 18 mL   Medications administered: We administered lidocaine, fentaNYL, dexamethasone, dexamethasone, and ropivacaine (PF) 2 mg/mL (0.2%).  See the medical record for exact dosing, route,  and time of administration.  Disposition: Discharge home  Discharge Date  & Time: 04/06/2018; 1239 hrs.   Follow-up plan:   Return in about 4 weeks (around 05/04/2018) for Post Procedure Evaluation.     Future Appointments  Date Time Provider Department Center  05/05/2018 10:15 AM Edward Jolly, MD [facility] None  05/16/2018  9:15 AM Elinor Parkinson, DPM [id number]   Primary Care Physician: Corky Downs, MD Location: [facility] Note by: Edward Jolly, MD Date: 04/06/2018; Time: 1:09 PM  Disclaimer:  Medicine is not an exact science. The only guarantee in medicine is that nothing is guaranteed. It is important to note that the decision to proceed with this intervention was based on the information collected from the patient. The Data and conclusions were drawn from the patient's questionnaire, the interview, and the physical examination. Because the information was provided in large part by the patient, it cannot be guaranteed that it has not been purposely or unconsciously manipulated. Every effort has been made to obtain as much relevant data as possible for this evaluation. It is important to note that the conclusions that lead to this procedure are derived in large part from the available data. Always take into account that the treatment will also be dependent on availability of resources and existing treatment guidelines, considered by other Pain Management Practitioners as being common knowledge and practice, at the time of the intervention. For Medico-Legal purposes, it is also important to point out that variation in procedural techniques and pharmacological choices are the acceptable norm. The indications, contraindications, technique, and results of the above procedure should only be interpreted and judged by a Board-Certified Interventional Pain Specialist with extensive familiarity and expertise in the same exact procedure and technique.

## 2018-04-06 NOTE — Progress Notes (Signed)
Safety precautions to be maintained throughout the outpatient stay will include: orient to surroundings, keep bed in low position, maintain call bell within reach at all times, provide assistance with transfer out of bed and ambulation.  

## 2018-04-07 ENCOUNTER — Telehealth: Payer: Self-pay

## 2018-04-07 NOTE — Telephone Encounter (Signed)
No answer- Left message to call if needed 

## 2018-04-20 ENCOUNTER — Ambulatory Visit: Payer: PRIVATE HEALTH INSURANCE | Admitting: Podiatry

## 2018-05-05 ENCOUNTER — Ambulatory Visit: Payer: PRIVATE HEALTH INSURANCE | Admitting: Student in an Organized Health Care Education/Training Program

## 2018-05-12 ENCOUNTER — Other Ambulatory Visit: Payer: Self-pay

## 2018-05-12 ENCOUNTER — Ambulatory Visit
Payer: PRIVATE HEALTH INSURANCE | Attending: Student in an Organized Health Care Education/Training Program | Admitting: Student in an Organized Health Care Education/Training Program

## 2018-05-12 DIAGNOSIS — G894 Chronic pain syndrome: Secondary | ICD-10-CM | POA: Diagnosis not present

## 2018-05-12 DIAGNOSIS — M47816 Spondylosis without myelopathy or radiculopathy, lumbar region: Secondary | ICD-10-CM | POA: Diagnosis not present

## 2018-05-12 DIAGNOSIS — M4727 Other spondylosis with radiculopathy, lumbosacral region: Secondary | ICD-10-CM

## 2018-05-12 MED ORDER — METHYLPREDNISOLONE 4 MG PO TBPK: ORAL_TABLET | ORAL | 1 refills | Status: AC

## 2018-05-12 NOTE — Progress Notes (Signed)
Pain Management Virtual Encounter Note - Virtual Visit via Video Conference Telehealth (real-time audio visits between healthcare provider and patient).  Patient's Phone No. & Preferred Pharmacy:  [phone] (home); [phone] (mobile); (Preferred) [phone] No e-mail address on record  [id number] [place], [place] - [address] [place] [id number] Phone: [phone] Fax: [phone]   Pre-screening note:  Our staff contacted Courtney Henderson and offered her an "in person", "face-to-face" appointment versus a telephone encounter. She indicated preferring the telephone encounter, at this time.  Reason for Virtual Visit: COVID-19*  Social distancing based on CDC and AMA recommendations.   I contacted Courtney Henderson on 05/12/2018 at 11:37 AM via video conference and clearly identified myself as Courtney JollyBilal Daleysa Kristiansen, MD. I verified that I was speaking with the correct person using two identifiers (Name and date of birth: [birth year]).  Advanced Informed Consent I sought verbal advanced consent from Courtney Henderson for virtual visit interactions. I informed Courtney Henderson of possible security and privacy concerns, risks, and limitations associated with providing "not-in-person" medical evaluation and management services. I also informed Courtney Henderson of the availability of "in-person" appointments. Finally, I informed her that there would be a charge for the virtual visit and that she could be  personally, fully or partially, financially responsible for it. Courtney Henderson expressed understanding and agreed to proceed.   Historic Elements   [name] is a 58 y.o. year old, female patient evaluated today after her last encounter by our practice on 04/07/2018. Courtney Henderson  has a past medical history of Diabetes mellitus without complication (HCC). She also  has no past surgical history on file. Courtney Henderson has a current medication list which includes the following prescription(s): vitamin  c, contour next ez monitor, breo ellipta, furosemide, gabapentin, glipizide, glucose blood, ibuprofen, klor-con 10, latanoprost, magnesium, metformin, microlet lancets, mometasone, prolensa, timolol, meloxicam, and methylprednisolone. She  reports that she has been smoking. She has never used smokeless tobacco. She reports previous alcohol use. She reports that she does not use drugs. Courtney Henderson is allergic to morphine and related and penicillins.   HPI  I last saw her on 04/06/2018. She is being evaluated for a post-procedure assessment.  Post-Procedure Evaluation  Procedure: Bilateral L3, L4, L5, S1 facet block  Pre-procedure pain level:  9/10 Post-procedure: 0/10          Sedation: Please see nurses note.  Effectiveness during initial hour after procedure(Ultra-Short Term Relief): 100 %  Local anesthetic used: Long-acting (4-6 hours) Effectiveness: Defined as any analgesic benefit obtained secondary to the administration of local anesthetics. This carries significant diagnostic value as to the etiological location, or anatomical origin, of the pain. Duration of benefit is expected to coincide with the duration of the local anesthetic used.  Effectiveness during initial 4-6 hours after procedure(Short-Term Relief): 100 %  Long-term benefit: Defined as any relief past the pharmacologic duration of the local anesthetics.  Effectiveness past the initial 6 hours after procedure(Long-Term Relief): 100%  Current benefits: Defined as benefit that persist at this time.   Analgesia:  <50% better Function: Courtney Henderson reports improvement in function ROM: Courtney Henderson reports improvement in ROM   Review of recent tests  DG C-Arm 1-60 Min-No Report Fluoroscopy was utilized by the requesting physician.  No radiographic  interpretation.    Office Visit on 09/07/2017  Component Date Value Ref Range Status  . Summary 09/07/2017 FINAL   Final   Comment:  ==================================================================== [facility]  COMP DRUG ANALYSIS,UR ==================================================================== Test                             Result       Flag       Units Drug Present and Declared for Prescription Verification   Gabapentin                     PRESENT      EXPECTED   Ibuprofen                      PRESENT      EXPECTED Drug Present not Declared for Prescription Verification   Diphenhydramine                PRESENT      UNEXPECTED ==================================================================== Test                      Result    Flag   Units      Ref Range   Creatinine              130              mg/dL      >=16 ==================================================================== Declared Medications:  The flagging and interpretation on this report are based on the  following declared medications.  Unexpected results may arise from  inaccuracies in the declared medications.  **Note: The                           testing scope of this panel includes these medications:  Gabapentin (Neurontin)  **Note: The testing scope of this panel does not include small to  moderate amounts of these reported medications:  Ibuprofen (Advil)  **Note: The testing scope of this panel does not include following  reported medications:  Bromfenac (Prolensa)  Fluticasone (Breo)  Furosemide (Lasix)  Magnesium  Metformin  Mometasone  Potassium  Timolol  Vilanterol (Breo)  Vitamin C ==================================================================== For clinical consultation, please call [phone]. ====================================================================    Assessment  The primary encounter diagnosis was Lumbar facet arthropathy. Diagnoses of Lumbar spondylosis, Lumbosacral spondylosis with radiculopathy, and Chronic pain syndrome were also pertinent to this visit.  Plan of Care  I am having Courtney Panning R.  Brightbill start on methylPREDNISolone. I am also having her maintain her timolol, metFORMIN, ibuprofen, magnesium, Klor-Con 10, mometasone, furosemide, Breo Ellipta, CONTOUR NEXT EZ MONITOR, Prolensa, glucose blood, Microlet Lancets, vitamin C, gabapentin, glipiZIDE, latanoprost, and meloxicam.  Post procedure evaluation status post bilateral L3, L4, L5, S1 diagnostic medial branch nerve block #1.  Patient endorses significant improvement in her low back and leg pain that is now gradually returning.  She states that for the first 2-2 and half weeks after a block, she had significantly reduced low back buttock and posterior thigh pain and was able to walk and perform ADLs with greater ease.  We discussed performing second diagnostic facet medial branch nerve block followed by lumbar radiofrequency ablation of these nerves.  Risks and benefits were reviewed and patient would like to proceed with second diagnostic facet medial branch nerve block.  We will also prescribe patient Medrol Dosepak for if/when she has pain flare as we are unable to get her in to do facet medial branch nerve block #2 until COVID-19 restrictions for elective procedures is lifted.  Patient endorsed understanding.  I also counseled  the patient on the risks of steroid therapy in the context of her type 2 diabetes.  I informed her that this could increase her blood sugars.  Risk reviewed patient endorsed understanding.  Pharmacotherapy (Medications Ordered): Meds ordered this encounter  Medications  . methylPREDNISolone (MEDROL) 4 MG TBPK tablet    Sig: Follow package instructions.    Dispense:  21 tablet    Refill:  1    Do not add to the "Automatic Refill" notification system.   Orders: Lumbar facet medial branch nerve block #2 Orders Placed This Encounter  Procedures  . LUMBAR FACET(MEDIAL BRANCH NERVE BLOCK) MBNB    Standing Status:   Future    Standing Expiration Date:   06/11/2018    Scheduling Instructions:     Side:  Bilateral     Level: L3-4, L4-5, & L5-S1 Facets (L2, L3, L4, L5, & S1 Medial Branch Nerves)     Sedation: With Sedation.     Timeframe: ASAA    Order Specific Question:   Where will this procedure be performed?    Answer:   [facility]   Follow-up plan:   Return for Procedure, After COVID-19 restrictions lifted.   I discussed the assessment and treatment plan with the patient. The patient was provided an opportunity to ask questions and all were answered. The patient agreed with the plan and demonstrated an understanding of the instructions.  Patient advised to call back or seek an in-person evaluation if the symptoms or condition worsens.  Total duration of non-face-to-face encounter: 25 minutes.  Note by: Courtney Jolly, MD Date: 05/12/2018; Time: 11:37 AM  Disclaimer:  * Given the special circumstances of the COVID-19 pandemic, the federal government has announced that the Office for Civil Rights (OCR) will exercise its enforcement discretion and will not impose penalties on physicians using telehealth in the event of noncompliance with regulatory requirements under the [employer] Portability and Accountability Act (HIPAA) in connection with the good faith provision of telehealth during the COVID-19 national public health emergency. (AMA)

## 2018-05-16 ENCOUNTER — Ambulatory Visit: Payer: PRIVATE HEALTH INSURANCE | Admitting: Podiatry

## 2018-05-19 ENCOUNTER — Other Ambulatory Visit: Payer: Self-pay

## 2018-05-19 ENCOUNTER — Encounter: Payer: Self-pay | Admitting: Podiatry

## 2018-05-19 ENCOUNTER — Ambulatory Visit (INDEPENDENT_AMBULATORY_CARE_PROVIDER_SITE_OTHER): Payer: PRIVATE HEALTH INSURANCE | Admitting: Podiatry

## 2018-05-19 VITALS — Temp 96.8°F

## 2018-05-19 DIAGNOSIS — M722 Plantar fascial fibromatosis: Secondary | ICD-10-CM

## 2018-05-19 NOTE — Progress Notes (Signed)
She presents today states that my arches it really started to bother medial heels are doing pretty good but the arches are hurting.  Objective: Vital signs are stable she is alert and oriented x3 she has pain to palpation medial longitudinal arch bilaterally.  Medial calcaneus is no longer tender.  Assessment: Pain limb secondary to a plantar fasciitis neuropathy.  Plan: I injected the area today with 10 mg Kenalog 5 mg Marcaine for maximal tenderness bilaterally.  Tolerated procedure well.

## 2018-06-20 HISTORY — PX: MULTIPLE TOOTH EXTRACTIONS: SHX2053

## 2018-06-29 ENCOUNTER — Encounter: Payer: Self-pay | Admitting: Podiatry

## 2018-06-29 ENCOUNTER — Ambulatory Visit (INDEPENDENT_AMBULATORY_CARE_PROVIDER_SITE_OTHER): Payer: PRIVATE HEALTH INSURANCE | Admitting: Podiatry

## 2018-06-29 ENCOUNTER — Other Ambulatory Visit: Payer: Self-pay

## 2018-06-29 VITALS — Temp 97.7°F

## 2018-06-29 DIAGNOSIS — M722 Plantar fascial fibromatosis: Secondary | ICD-10-CM | POA: Diagnosis not present

## 2018-06-29 NOTE — Progress Notes (Signed)
She presents today for a follow-up of her plantar fasciitis to her heel states that her heels are actually doing okay she is starting to develop more pain in the medial longitudinal arch.  Denies any trauma.  Objective: Vital signs are stable she is alert and oriented x3.  Pulses are palpable.  She has no pain on palpation medial calcaneal tubercles bilaterally though she does have pain on palpation of the medial band of the plantar fascia with dorsiflexion of the toe along the medial longitudinal arch just inferior to the navicular tuberosity.  This is noted to be bilateral.  No fibromas are noted.  Assessment: Plantar fasciitis medial longitudinal arch medial band of the plantar fascia.  Plan: At this point I injected 10 mg of Kenalog 5 mg Marcaine point maximal tenderness bilaterally after sterile Betadine skin prep.  She tolerated the procedure well without complications and I will follow-up with her in about 6 to 8 weeks.

## 2018-07-13 ENCOUNTER — Other Ambulatory Visit: Payer: Self-pay

## 2018-07-13 ENCOUNTER — Other Ambulatory Visit
Admission: RE | Admit: 2018-07-13 | Discharge: 2018-07-13 | Disposition: A | Discharge status: Home / Self Care | Payer: PRIVATE HEALTH INSURANCE | Source: Ambulatory Visit | Attending: Student in an Organized Health Care Education/Training Program | Admitting: Student in an Organized Health Care Education/Training Program

## 2018-07-13 DIAGNOSIS — Z1159 Encounter for screening for other viral diseases: Secondary | ICD-10-CM | POA: Insufficient documentation

## 2018-07-14 LAB — NOVEL CORONAVIRUS, NAA (HOSP ORDER, SEND-OUT TO REF LAB; TAT 18-24 HRS): SARS-CoV-2, NAA: NOT DETECTED

## 2018-07-18 ENCOUNTER — Ambulatory Visit (HOSPITAL_BASED_OUTPATIENT_CLINIC_OR_DEPARTMENT_OTHER): Payer: PRIVATE HEALTH INSURANCE | Admitting: Student in an Organized Health Care Education/Training Program

## 2018-07-18 ENCOUNTER — Other Ambulatory Visit: Payer: Self-pay

## 2018-07-18 ENCOUNTER — Encounter: Payer: Self-pay | Admitting: Student in an Organized Health Care Education/Training Program

## 2018-07-18 ENCOUNTER — Ambulatory Visit
Admission: RE | Admit: 2018-07-18 | Discharge: 2018-07-18 | Disposition: A | Discharge status: Home / Self Care | Payer: PRIVATE HEALTH INSURANCE | Source: Ambulatory Visit | Attending: Student in an Organized Health Care Education/Training Program | Admitting: Student in an Organized Health Care Education/Training Program

## 2018-07-18 VITALS — BP 114/73 | HR 84 | Temp 97.7°F | Resp 25 | Ht 63.0 in | Wt 222.0 lb

## 2018-07-18 DIAGNOSIS — M47816 Spondylosis without myelopathy or radiculopathy, lumbar region: Secondary | ICD-10-CM | POA: Insufficient documentation

## 2018-07-18 MED ORDER — IOHEXOL 180 MG/ML  SOLN: 10.0000 mL | Freq: Once | INTRAMUSCULAR | Status: DC

## 2018-07-18 MED ORDER — DEXAMETHASONE SODIUM PHOSPHATE 10 MG/ML IJ SOLN
INTRAMUSCULAR | Status: AC
  Filled 2018-07-18: qty 2

## 2018-07-18 MED ORDER — ROPIVACAINE HCL 2 MG/ML IJ SOLN
INTRAMUSCULAR | Status: AC
  Filled 2018-07-18: qty 20

## 2018-07-18 MED ORDER — ROPIVACAINE HCL 2 MG/ML IJ SOLN: 2.0000 mL | Freq: Once | INTRAMUSCULAR | Status: DC

## 2018-07-18 MED ORDER — FENTANYL CITRATE (PF) 100 MCG/2ML IJ SOLN
25.0000 ug | INTRAMUSCULAR | Status: DC | PRN
  Administered 2018-07-18: 50 ug via INTRAVENOUS

## 2018-07-18 MED ORDER — FENTANYL CITRATE (PF) 100 MCG/2ML IJ SOLN
INTRAMUSCULAR | Status: AC
  Filled 2018-07-18: qty 2

## 2018-07-18 MED ORDER — MIDAZOLAM HCL 5 MG/5ML IJ SOLN
1.0000 mg | INTRAMUSCULAR | Status: DC | PRN
  Administered 2018-07-18: 2 mg via INTRAVENOUS

## 2018-07-18 MED ORDER — LIDOCAINE HCL 2 % IJ SOLN
20.0000 mL | Freq: Once | INTRAMUSCULAR | Status: AC
  Administered 2018-07-18: 400 mg

## 2018-07-18 MED ORDER — MIDAZOLAM HCL 5 MG/5ML IJ SOLN
INTRAMUSCULAR | Status: AC
  Filled 2018-07-18: qty 5

## 2018-07-18 MED ORDER — DEXAMETHASONE SODIUM PHOSPHATE 10 MG/ML IJ SOLN
10.0000 mg | Freq: Once | INTRAMUSCULAR | Status: AC
  Administered 2018-07-18: 10 mg

## 2018-07-18 MED ORDER — LIDOCAINE HCL 2 % IJ SOLN
INTRAMUSCULAR | Status: AC
  Filled 2018-07-18: qty 20

## 2018-07-18 MED ORDER — DEXAMETHASONE SODIUM PHOSPHATE 10 MG/ML IJ SOLN
10.0000 mg | Freq: Once | INTRAMUSCULAR | Status: AC
  Administered 2018-07-18: 11:00:00 10 mg

## 2018-07-18 NOTE — Progress Notes (Signed)
Patient's Name: Courtney Henderson  MRN: [id number]  Referring Provider: Corky DownsMasoud, Javed, MD  DOB: December 09, 1960  PCP: Corky DownsMasoud, Javed, MD  DOS: 07/18/2018  Note by: Edward JollyBilal Damarius Karnes, MD  Service setting: Ambulatory outpatient  Specialty: Interventional Pain Management  Patient type: Established  Location: [facility] (AMB) Pain Management Facility  Visit type: Interventional Procedure   Primary Reason for Visit: Interventional Pain Management Treatment. CC: Back Pain and Leg Pain (leg)  Procedure:          Anesthesia, Analgesia, Anxiolysis:  Type: Lumbar Facet, Medial Branch Block(s) #2  Primary Purpose: Diagnostic Region: Posterolateral Lumbosacral Spine Level:  L3, L4, L5, & S1 Medial Branch Level(s). Injecting these levels blocks the L3-4, L4-5, and L5-S1 lumbar facet joints. Laterality: Bilateral  Type: Moderate (Conscious) Sedation combined with Local Anesthesia Indication(s): Analgesia and Anxiety Route: Intravenous (IV) IV Access: Secured Sedation: Meaningful verbal contact was maintained at all times during the procedure  Local Anesthetic: Lidocaine 1-2%  Position: Prone   Indications: 1. Lumbar facet arthropathy    Pain Score: Pre-procedure: 5 /10 Post-procedure: 0-No pain/10  Pre-op Assessment:  [name] is a 58 y.o. (year old), female patient, seen today for interventional treatment. She  has a past surgical history that includes Multiple tooth extractions (06/2018). [name] has a current medication list which includes the following prescription(s): vitamin c, contour next ez monitor, breo ellipta, furosemide, gabapentin, glipizide, glucose blood, ibuprofen, klor-con 10, latanoprost, magnesium, meloxicam, metformin, microlet lancets, mometasone, timolol, and prolensa, and the following Facility-Administered Medications: fentanyl, iohexol, midazolam, ropivacaine (pf) 2 mg/ml (0.2%), and ropivacaine (pf) 2 mg/ml (0.2%). Her primarily concern today is the Back Pain and Leg Pain (leg)  Initial  Vital Signs:  Pulse/HCG Rate: 88ECG Heart Rate: 84 Temp: 98.8 F (37.1 C) Resp: 18 BP: 139/74 SpO2: 98 %  BMI: Estimated body mass index is 39.33 kg/m as calculated from the following:   Height as of this encounter: 5\' 3"  (1.6 m).   Weight as of this encounter: 222 lb (100.7 kg).  Risk Assessment: Allergies: Reviewed. She is allergic to morphine and related and penicillins.  Allergy Precautions: None required Coagulopathies: Reviewed. None identified.  Blood-thinner therapy: None at this time Active Infection(s): Reviewed. None identified. [name] is afebrile  Site Confirmation: [name] was asked to confirm the procedure and laterality before marking the site Procedure checklist: Completed Consent: Before the procedure and under the influence of no sedative(s), amnesic(s), or anxiolytics, the patient was informed of the treatment options, risks and possible complications. To fulfill our ethical and legal obligations, as recommended by the American Medical Association's Code of Ethics, I have informed the patient of my clinical impression; the nature and purpose of the treatment or procedure; the risks, benefits, and possible complications of the intervention; the alternatives, including doing nothing; the risk(s) and benefit(s) of the alternative treatment(s) or procedure(s); and the risk(s) and benefit(s) of doing nothing. The patient was provided information about the general risks and possible complications associated with the procedure. These may include, but are not limited to: failure to achieve desired goals, infection, bleeding, organ or nerve damage, allergic reactions, paralysis, and death. In addition, the patient was informed of those risks and complications associated to Spine-related procedures, such as failure to decrease pain; infection (i.e.: Meningitis, epidural or intraspinal abscess); bleeding (i.e.: epidural hematoma, subarachnoid hemorrhage, or any other type of  intraspinal or peri-dural bleeding); organ or nerve damage (i.e.: Any type of peripheral nerve, nerve root, or spinal cord injury) with subsequent  damage to sensory, motor, and/or autonomic systems, resulting in permanent pain, numbness, and/or weakness of one or several areas of the body; allergic reactions; (i.e.: anaphylactic reaction); and/or death. Furthermore, the patient was informed of those risks and complications associated with the medications. These include, but are not limited to: allergic reactions (i.e.: anaphylactic or anaphylactoid reaction(s)); adrenal axis suppression; blood sugar elevation that in diabetics may result in ketoacidosis or comma; water retention that in patients with history of congestive heart failure may result in shortness of breath, pulmonary edema, and decompensation with resultant heart failure; weight gain; swelling or edema; medication-induced neural toxicity; particulate matter embolism and blood vessel occlusion with resultant organ, and/or nervous system infarction; and/or aseptic necrosis of one or more joints. Finally, the patient was informed that Medicine is not an exact science; therefore, there is also the possibility of unforeseen or unpredictable risks and/or possible complications that may result in a catastrophic outcome. The patient indicated having understood very clearly. We have given the patient no guarantees and we have made no promises. Enough time was given to the patient to ask questions, all of which were answered to the patient's satisfaction. [name] has indicated that she wanted to continue with the procedure. Attestation: I, the ordering provider, attest that I have discussed with the patient the benefits, risks, side-effects, alternatives, likelihood of achieving goals, and potential problems during recovery for the procedure that I have provided informed consent. Date   Time: 07/18/2018 10:31 AM  Pre-Procedure Preparation:  Monitoring:  As per clinic protocol. Respiration, ETCO2, SpO2, BP, heart rate and rhythm monitor placed and checked for adequate function Safety Precautions: Patient was assessed for positional comfort and pressure points before starting the procedure. Time-out: I initiated and conducted the "Time-out" before starting the procedure, as per protocol. The patient was asked to participate by confirming the accuracy of the "Time Out" information. Verification of the correct person, site, and procedure were performed and confirmed by me, the nursing staff, and the patient. "Time-out" conducted as per Joint Commission's Universal Protocol (UP.01.01.01). Time: 1127  Description of Procedure:          Laterality: Bilateral. The procedure was performed in identical fashion on both sides. Levels:   L3, L4, L5, & S1 Medial Branch Level(s) Area Prepped: Posterior Lumbosacral Region Prepping solution: ChloraPrep (2% chlorhexidine gluconate and 70% isopropyl alcohol) Safety Precautions: Aspiration looking for blood return was conducted prior to all injections. At no point did we inject any substances, as a needle was being advanced. Before injecting, the patient was told to immediately notify me if she was experiencing any new onset of "ringing in the ears, or metallic taste in the mouth". No attempts were made at seeking any paresthesias. Safe injection practices and needle disposal techniques used. Medications properly checked for expiration dates. SDV (single dose vial) medications used. After the completion of the procedure, all disposable equipment used was discarded in the proper designated medical waste containers. Local Anesthesia: Protocol guidelines were followed. The patient was positioned over the fluoroscopy table. The area was prepped in the usual manner. The time-out was completed. The target area was identified using fluoroscopy. A 12-in long, straight, sterile hemostat was used with fluoroscopic guidance to locate  the targets for each level blocked. Once located, the skin was marked with an approved surgical skin marker. Once all sites were marked, the skin (epidermis, dermis, and hypodermis), as well as deeper tissues (fat, connective tissue and muscle) were infiltrated with a small amount of  a short-acting local anesthetic, loaded on a 10cc syringe with a 25G, 1.5-in  Needle. An appropriate amount of time was allowed for local anesthetics to take effect before proceeding to the next step. Local Anesthetic: Lidocaine 2.0% The unused portion of the local anesthetic was discarded in the proper designated containers. Technical explanation of process:    L3 Medial Branch Nerve Block (MBB): The target area for the L3 medial branch is at the junction of the postero-lateral aspect of the superior articular process and the superior, posterior, and medial edge of the transverse process of L4. Under fluoroscopic guidance, a Quincke needle was inserted until contact was made with os over the superior postero-lateral aspect of the pedicular shadow (target area). After negative aspiration for blood, 1 mL of the nerve block solution was injected without difficulty or complication. The needle was removed intact. L4 Medial Branch Nerve Block (MBB): The target area for the L4 medial branch is at the junction of the postero-lateral aspect of the superior articular process and the superior, posterior, and medial edge of the transverse process of L5. Under fluoroscopic guidance, a Quincke needle was inserted until contact was made with os over the superior postero-lateral aspect of the pedicular shadow (target area). After negative aspiration for blood, 1mL of the nerve block solution was injected without difficulty or complication. The needle was removed intact. L5 Medial Branch Nerve Block (MBB): The target area for the L5 medial branch is at the junction of the postero-lateral aspect of the superior articular process and the superior,  posterior, and medial edge of the sacral ala. Under fluoroscopic guidance, a Quincke needle was inserted until contact was made with os over the superior postero-lateral aspect of the pedicular shadow (target area). After negative aspiration for blood, 1mL of the nerve block solution was injected without difficulty or complication. The needle was removed intact. S1 Medial Branch Nerve Block (MBB): The target area for the S1 medial branch is at the posterior and inferior 6 o'clock position of the L5-S1 facet joint. Under fluoroscopic guidance, the Quincke needle inserted for the L5 MBB was redirected until contact was made with os over the inferior and postero aspect of the sacrum, at the 6 o' clock position under the L5-S1 facet joint (Target area). After negative aspiration for blood, 1mL of the nerve block solution was injected without difficulty or complication. The needle was removed intact.  Nerve block solution: 8 cc solution made of 6 cc of 0.2% ropivacaine, 2 cc of Decadron 10 mg/cc.  1 cc injected at each level above bilaterally.  Total steroid dose equals 20 mg Decadron Procedural Needles: 22-gauge, 5-inch, Quincke needles used for all levels.  Once the entire procedure was completed, the treated area was cleaned, making sure to leave some of the prepping solution back to take advantage of its long term bactericidal properties.   Illustration of the posterior view of the lumbar spine and the posterior neural structures. Laminae of L2 through S1 are labeled. DPRL5, dorsal primary ramus of L5; DPRS1, dorsal primary ramus of S1; DPR3, dorsal primary ramus of L3; FJ, facet (zygapophyseal) joint L3-L4; I, inferior articular process of L4; LB1, lateral branch of dorsal primary ramus of L1; IAB, inferior articular branches from L3 medial branch (supplies L4-L5 facet joint); IBP, intermediate branch plexus; MB3, medial branch of dorsal primary ramus of L3; NR3, third lumbar nerve root; S, superior  articular process of L5; SAB, superior articular branches from L4 (supplies L4-5 facet joint also); TP3, transverse  process of L3.  Vitals:   07/18/18 1145 07/18/18 1152 07/18/18 1205 07/18/18 1215  BP: (!) 145/85 140/72 122/70 114/73  Pulse: 84     Resp: (!) 21 (!) 23 (!) 25 (!) 25  Temp:  98 F (36.7 C)  97.7 F (36.5 C)  TempSrc:      SpO2: 99% 96% 96% 99%  Weight:      Height:         Start Time: 1127 hrs. End Time: 1145 hrs.  Imaging Guidance (Spinal):          Type of Imaging Technique: Fluoroscopy Guidance (Spinal) Indication(s): Assistance in needle guidance and placement for procedures requiring needle placement in or near specific anatomical locations not easily accessible without such assistance. Exposure Time: Please see nurses notes. Contrast: None used. Fluoroscopic Guidance: I was personally present during the use of fluoroscopy. "Tunnel Vision Technique" used to obtain the best possible view of the target area. Parallax error corrected before commencing the procedure. "Direction-depth-direction" technique used to introduce the needle under continuous pulsed fluoroscopy. Once target was reached, antero-posterior, oblique, and lateral fluoroscopic projection used confirm needle placement in all planes. Images permanently stored in EMR. Interpretation: No contrast injected. I personally interpreted the imaging intraoperatively. Adequate needle placement confirmed in multiple planes. Permanent images saved into the patient's record.  Antibiotic Prophylaxis:   Anti-infectives (From admission, onward)   None     Indication(s): None identified  Post-operative Assessment:  Post-procedure Vital Signs:  Pulse/HCG Rate: 8483 Temp: 97.7 F (36.5 C) Resp: (!) 25 BP: 114/73 SpO2: 99 %  EBL: None  Complications: No immediate post-treatment complications observed by team, or reported by patient.  Note: The patient tolerated the entire procedure well. A repeat set of  vitals were taken after the procedure and the patient was kept under observation following institutional policy, for this type of procedure. Post-procedural neurological assessment was performed, showing return to baseline, prior to discharge. The patient was provided with post-procedure discharge instructions, including a section on how to identify potential problems. Should any problems arise concerning this procedure, the patient was given instructions to immediately contact us, at any time, without hesitation. In any case, we plan to contact the patient by telephone for a follow-up status report regarding this interventional procedure.  Comments:  No additional relevant information.  Plan of Care  Orders:  Orders Placed This Encounter  Procedures   DG PAIN CLINIC C-ARM 1-60 MIN NO REPORT    Intraoperative interpretation by procedural physician at [facility].    Standing Status:   Standing    Number of Occurrences:   1    Order Specific Question:   Reason for exam:    Answer:   Assistance in needle guidance and placement for procedures requiring needle placement in or near specific anatomical locations not easily accessible without such assistance.   Medications ordered for procedure: Meds ordered this encounter  Medications   iohexol (OMNIPAQUE) 180 MG/ML injection 10 mL    Must be Myelogram-compatible. If not available, you may substitute with a water-soluble, non-ionic, hypoallergenic, myelogram-compatible radiological contrast medium.   lidocaine (XYLOCAINE) 2 % (with pres) injection 400 mg   midazolam (VERSED) 5 MG/5ML injection 1-2 mg    Make sure Flumazenil is available in the pyxis when using this medication. If oversedation occurs, administer 0.2 mg IV over 15 sec. If after 45 sec no response, administer 0.2 mg again over 1 min; may repeat at 1 min intervals; not to exceed  4 doses (1 mg)   fentaNYL (SUBLIMAZE) injection 25-50 mcg    Make sure Narcan is available  in the pyxis when using this medication. In the event of respiratory depression (RR< 8/min): Titrate NARCAN (naloxone) in increments of 0.1 to 0.2 mg IV at 2-3 minute intervals, until desired degree of reversal.   dexamethasone (DECADRON) injection 10 mg   dexamethasone (DECADRON) injection 10 mg   ropivacaine (PF) 2 mg/mL (0.2%) (NAROPIN) injection 2 mL   ropivacaine (PF) 2 mg/mL (0.2%) (NAROPIN) injection 2 mL   Medications administered: We administered lidocaine, midazolam, fentaNYL, dexamethasone, and dexamethasone.  See the medical record for exact dosing, route, and time of administration.  Disposition: Discharge home  Discharge Date & Time: 07/18/2018; 1215 hrs.   Follow-up plan:   Return in about 4 weeks (around 08/15/2018) for Post Procedure Evaluation, virtual.    Discuss RFA at next visit  Future Appointments  Date Time Provider [place]  08/10/2018  8:10 AM Sheldon, Bennie Pierini T, [place] [facility]  08/18/2018 10:00 AM Gillis Santa, MD [facility] None   Primary Care Physician: Cletis Athens, MD Location: [facility] Note by: Gillis Santa, MD Date: 07/18/2018; Time: 12:31 PM  Disclaimer:  Medicine is not an exact science. The only guarantee in medicine is that nothing is guaranteed. It is important to note that the decision to proceed with this intervention was based on the information collected from the patient. The Data and conclusions were drawn from the patient's questionnaire, the interview, and the physical examination. Because the information was provided in large part by the patient, it cannot be guaranteed that it has not been purposely or unconsciously manipulated. Every effort has been made to obtain as much relevant data as possible for this evaluation. It is important to note that the conclusions that lead to this procedure are derived in large part from the available data. Always take into account that the treatment will also be  dependent on availability of resources and existing treatment guidelines, considered by other Pain Management Practitioners as being common knowledge and practice, at the time of the intervention. For Medico-Legal purposes, it is also important to point out that variation in procedural techniques and pharmacological choices are the acceptable norm. The indications, contraindications, technique, and results of the above procedure should only be interpreted and judged by a Board-Certified Interventional Pain Specialist with extensive familiarity and expertise in the same exact procedure and technique.

## 2018-07-18 NOTE — Progress Notes (Signed)
Safety precautions to be maintained throughout the outpatient stay will include: orient to surroundings, keep bed in low position, maintain call bell within reach at all times, provide assistance with transfer out of bed and ambulation.  

## 2018-07-18 NOTE — Patient Instructions (Signed)

## 2018-07-19 ENCOUNTER — Telehealth: Payer: Self-pay

## 2018-07-19 NOTE — Telephone Encounter (Signed)
Post procedure phone call.  LM 

## 2018-08-10 ENCOUNTER — Other Ambulatory Visit: Payer: Self-pay

## 2018-08-10 ENCOUNTER — Ambulatory Visit: Payer: PRIVATE HEALTH INSURANCE | Admitting: Podiatry

## 2018-08-10 ENCOUNTER — Encounter

## 2018-08-10 ENCOUNTER — Encounter: Payer: Self-pay | Admitting: Podiatry

## 2018-08-10 VITALS — Temp 98.2°F

## 2018-08-10 DIAGNOSIS — M722 Plantar fascial fibromatosis: Secondary | ICD-10-CM | POA: Diagnosis not present

## 2018-08-10 DIAGNOSIS — I890 Lymphedema, not elsewhere classified: Secondary | ICD-10-CM

## 2018-08-10 NOTE — Progress Notes (Signed)
She presents today complaining of bilateral heel pain.  Also complaining of swelling bilateral lower extremities  Objective: Vital signs are stable she is alert and oriented x3 multiple varicosities with soft tissue swelling lower legs.  There appears to be venous insufficiency they are mildly erythematous as they usually are.  Does not appear to be cellulitis.  Though she does have a superficial ulceration due to an injury on the anterior leg right demonstrating fibrin deposition but no signs of infection.  She has pain on palpation medial calcaneal tubercles bilateral.  Assessment: Venous insufficiency bilateral lower extremity chronic in nature.  Chronic proximal plantar fasciitis bilateral.  Plan: Discussed etiology pathology conservative therapy at this point time went ahead and reinjected bilateral heels today 20 mg Kenalog 5 mg Marcaine for maximal tenderness.  Also send her for vascular evaluation venous studies bilateral lower extremity.  Follow-up with her in 4 to 6 weeks

## 2018-08-17 ENCOUNTER — Encounter: Payer: Self-pay | Admitting: Student in an Organized Health Care Education/Training Program

## 2018-08-18 ENCOUNTER — Ambulatory Visit
Payer: PRIVATE HEALTH INSURANCE | Attending: Student in an Organized Health Care Education/Training Program | Admitting: Student in an Organized Health Care Education/Training Program

## 2018-08-18 ENCOUNTER — Other Ambulatory Visit: Payer: Self-pay

## 2018-08-18 ENCOUNTER — Encounter: Payer: Self-pay | Admitting: Student in an Organized Health Care Education/Training Program

## 2018-08-18 DIAGNOSIS — G894 Chronic pain syndrome: Secondary | ICD-10-CM | POA: Diagnosis not present

## 2018-08-18 DIAGNOSIS — M47816 Spondylosis without myelopathy or radiculopathy, lumbar region: Secondary | ICD-10-CM

## 2018-08-18 NOTE — Progress Notes (Signed)
Pain Management Virtual Encounter Note - Virtual Visit via Telephone Telehealth (real-time audio visits between healthcare provider and patient).   Patient's Phone No. & Preferred Pharmacy:  [phone] (home); [phone] (mobile); (Preferred) [phone] No e-mail address on record  [id number] [place], [place] - [address] [place] [address] Phone: [phone] Fax: [phone]    Pre-screening note:  Our staff contacted Courtney Henderson and offered her an "in person", "face-to-face" appointment versus a telephone encounter. She indicated preferring the telephone encounter, at this time.   Reason for Virtual Visit: COVID-19*  Social distancing based on CDC and AMA recommendations.   I contacted Courtney Henderson on 08/18/2018 via telephone.      I clearly identified myself as Gillis Santa, MD. I verified that I was speaking with the correct person using two identifiers (Name: Courtney Henderson, and date of birth: 04-20-1960).  Advanced Informed Consent I sought verbal advanced consent from Courtney Henderson for virtual visit interactions. I informed Courtney Henderson of possible security and privacy concerns, risks, and limitations associated with providing "not-in-person" medical evaluation and management services. I also informed Courtney Henderson of the availability of "in-person" appointments. Finally, I informed her that there would be a charge for the virtual visit and that she could be  personally, fully or partially, financially responsible for it. Courtney Henderson expressed understanding and agreed to proceed.   Historic Elements   Courtney Henderson is a 58 y.o. year old, female patient evaluated today after her last encounter by our practice on 07/19/2018. Courtney Henderson  has a past medical history of Diabetes mellitus without complication ([place]). She also  has a past surgical history that includes Multiple tooth extractions (06/2018). Courtney Henderson has a current medication list which  includes the following prescription(s): vitamin c, contour next ez monitor, breo ellipta, furosemide, gabapentin, glipizide, glucose blood, ibuprofen, klor-con 10, latanoprost, magnesium, meloxicam, metformin, microlet lancets, mometasone, prolensa, and timolol. She  reports that she has been smoking. She has never used smokeless tobacco. She reports previous alcohol use. She reports that she does not use drugs. Courtney Henderson is allergic to morphine and related and penicillins.   HPI  Today, she is being contacted for a post-procedure assessment.  Evaluation of last interventional procedure  07/18/2018 Procedure: Bilateral L3,4,5 S1 Facet Block Pre-procedure pain score:  5/10 Post-procedure pain score: 0/10         Influential Factors: Intra-procedural challenges: None observed.         Reported side-effects: None.        Post-procedural adverse reactions or complications: None reported         Sedation: Please see nurses note for DOS. When no sedatives are used, the analgesic levels obtained are directly associated to the effectiveness of the local anesthetics. However, when sedation is provided, the level of analgesia obtained during the initial 1 hour following the intervention, is believed to be the result of a combination of factors. These factors may include, but are not limited to: 1. The effectiveness of the local anesthetics used. 2. The effects of the analgesic(s) and/or anxiolytic(s) used. 3. The degree of discomfort experienced by the patient at the time of the procedure. 4. The patients ability and reliability in recalling and recording the events. 5. The presence and influence of possible secondary gains and/or psychosocial factors. Reported result: Relief experienced during the 1st hour after the procedure: 100 % (Ultra-Short Term Relief)  Interpretative annotation: Clinically appropriate result. Analgesia during this period is likely to be Local Anesthetic and/or IV  Sedative (Analgesic/Anxiolytic) related.          Effects of local anesthetic: The analgesic effects attained during this period are directly associated to the localized infiltration of local anesthetics and therefore cary significant diagnostic value as to the etiological location, or anatomical origin, of the pain. Expected duration of relief is directly dependent on the pharmacodynamics of the local anesthetic used. Long-acting (4-6 hours) anesthetics used.  Reported result: Relief during the next 4 to 6 hour after the procedure: 100 % (Short-Term Relief)            Interpretative annotation: Clinically appropriate result. Analgesia during this period is likely to be Local Anesthetic-related.          Long-term benefit: Defined as the period of time past the expected duration of local anesthetics (1 hour for short-acting and 4-6 hours for long-acting). With the possible exception of prolonged sympathetic blockade from the local anesthetics, benefits during this period are typically attributed to, or associated with, other factors such as analgesic sensory neuropraxia, antiinflammatory effects, or beneficial biochemical changes provided by agents other than the local anesthetics.  Reported result: Extended relief following procedure: 80 % (Long-Term Relief)            Interpretative annotation: Clinically appropriate result. Good relief. No permanent benefit expected. Inflammation plays a part in the etiology to the pain.         Pertinent Labs   SAFETY SCREENING Profile Lab Results  Component Value Date   SARSCOV2NAA NOT DETECTED 07/13/2018   COVIDSOURCE NASOPHARYNGEAL 07/13/2018   Renal Function No results found for: BUN, CREATININE, BCR, GFRAA, GFRNONAA Hepatic Function No results found for: AST, ALT, ALBUMIN UDS Summary  Date Value Ref Range Status  09/07/2017 FINAL  Final    Comment:    ==================================================================== TOXASSURE COMP DRUG  ANALYSIS,UR ==================================================================== Test                             Result       Flag       Units Drug Present and Declared for Prescription Verification   Gabapentin                     PRESENT      EXPECTED   Ibuprofen                      PRESENT      EXPECTED Drug Present not Declared for Prescription Verification   Diphenhydramine                PRESENT      UNEXPECTED ==================================================================== Test                      Result    Flag   Units      Ref Range   Creatinine              130              mg/dL      >=16>=20 ==================================================================== Declared Medications:  The flagging and interpretation on this report are based on the  following declared medications.  Unexpected results may arise from  inaccuracies in the declared medications.  **Note: The testing scope of this panel includes these medications:  Gabapentin (Neurontin)  **Note: The  testing scope of this panel does not include small to  moderate amounts of these reported medications:  Ibuprofen (Advil)  **Note: The testing scope of this panel does not include following  reported medications:  Bromfenac (Prolensa)  Fluticasone (Breo)  Furosemide (Lasix)  Magnesium  Metformin  Mometasone  Potassium  Timolol  Vilanterol (Breo)  Vitamin C ==================================================================== For clinical consultation, please call [phone]. ====================================================================    Note: Above Lab results reviewed.  Recent imaging  DG PAIN CLINIC C-ARM 1-60 MIN NO REPORT Fluoro was used, but no Radiologist interpretation will be provided.  Please refer to "NOTES" tab for provider progress note.  Assessment  The primary encounter diagnosis was Lumbar facet arthropathy. Diagnoses of Lumbar spondylosis and Chronic pain syndrome were also  pertinent to this visit.  Plan of Care  I am having Courtney Henderson maintain her timolol, metFORMIN, ibuprofen, magnesium, Klor-Con 10, mometasone, furosemide, Breo Ellipta, CONTOUR NEXT EZ MONITOR, Prolensa, glucose blood, Microlet Lancets, vitamin C, gabapentin, glipiZIDE, latanoprost, and meloxicam.  s/p 2 diagnostic lumbar facet medial branch nerve blocks at bilateral L3, L4, L5, S1 on 04/06/2018 and 07/18/2018.  These procedures provided her with significant pain relief in regards to her axial low back and buttock pain; greater than 75% for approximately 4 weeks and ongoing from her most recent facet block.  Patient also endorses improvement with ambulation.  We discussed the next step which would include lumbar facet radiofrequency ablation starting with her most painful side first.  We had a long discussion about risks and benefits and potential therapeutic relief after the procedure.  Order placed for PRN lumbar facet medial branch RFA L3, L4, L5, S1 starting with the left side first  Orders:  Orders Placed This Encounter  Procedures  . Radiofrequency,Lumbar    For low back pain.    Standing Status:   Standing    Number of Occurrences:   1    Standing Expiration Date:   02/18/2020    Scheduling Instructions:     Side(s): Left-sided     Level: L3-4, L4-5, & L5-S1 Facets (L3, L4, L5, & S1 Medial Branch Nerves)     Sedation: With Sedation     TIMEFRAME: PRN procedure. (Ms. [name] will call when needed.)    Order Specific Question:   Where will this procedure be performed?    Answer:   [facility]   Follow-up plan:   Return PRN LEFT L3,4,5 S1 RFA.     s/p 2 diagnostic lumbar facet medial branch nerve blocks at bilateral L3, L4, L5, S1 on 04/06/2018 and 07/18/2018.  These procedures provided her with significant pain relief in regards to her axial low back and buttock pain; greater than 75% for approximately 4 weeks and ongoing from her most recent facet block.  PRN lumbar facet  medial branch RFA L3, L4, L5, S1 starting with the left side first.   Recent Visits Date Type Provider Dept  07/18/18 Procedure visit Edward JollyLateef, Dorwin Fitzhenry, MD [facility]  Showing recent visits within past 90 days and meeting all other requirements   Today's Visits Date Type Provider Dept  08/18/18 Office Visit Edward JollyLateef, Irene Collings, MD [facility]  Showing today's visits and meeting all other requirements   Future Appointments No visits were found meeting these conditions.  Showing future appointments within next 90 days and meeting all other requirements   I discussed the assessment and treatment plan with the patient. The patient was provided an opportunity to ask questions and  all were answered. The patient agreed with the plan and demonstrated an understanding of the instructions.  Patient advised to call back or seek an in-person evaluation if the symptoms or condition worsens.  Total duration of non-face-to-face encounter:25 minutes.  Note by: Edward JollyBilal Temima Kutsch, MD Date: 08/18/2018; Time: 10:34 AM  Note: This dictation was prepared with Dragon dictation. Any transcriptional errors that may result from this process are unintentional.  Disclaimer:  * Given the special circumstances of the COVID-19 pandemic, the federal government has announced that the Office for Civil Rights (OCR) will exercise its enforcement discretion and will not impose penalties on physicians using telehealth in the event of noncompliance with regulatory requirements under the [employer] Insurance Portability and Accountability Act (HIPAA) in connection with the good faith provision of telehealth during the COVID-19 national public health emergency. (AMA)

## 2018-08-30 ENCOUNTER — Other Ambulatory Visit: Payer: Self-pay

## 2018-08-30 ENCOUNTER — Ambulatory Visit: Payer: PRIVATE HEALTH INSURANCE | Attending: Podiatry | Admitting: Occupational Therapy

## 2018-08-30 ENCOUNTER — Encounter: Payer: Self-pay | Admitting: Occupational Therapy

## 2018-08-30 DIAGNOSIS — I890 Lymphedema, not elsewhere classified: Secondary | ICD-10-CM | POA: Insufficient documentation

## 2018-08-30 NOTE — Patient Instructions (Signed)

## 2018-08-31 NOTE — Therapy (Signed)
[place] [facility] MAIN [facility] [address] [place], [place], [address] Phone: [phone]   Fax:  [phone]  Occupational Therapy Evaluation  Patient Details  Name: Courtney Henderson MRN: [id number] Date of Birth: July 21, 1960 Referring Provider (OT): Ernestene KielMax Hyatt, [place]   Encounter Date: 08/30/2018  OT End of Session - 08/30/18 1417    Visit Number  1    Date for OT Re-Evaluation  11/28/18    OT Start Time  0400    OT Stop Time  0515    OT Time Calculation (min)  75 min       Past Medical History:  Diagnosis Date  . Diabetes mellitus without complication [facility](HCC)     Past Surgical History:  Procedure Laterality Date  . MULTIPLE TOOTH EXTRACTIONS  06/2018   2-3 weeks ago; 10 teeth    There were no vitals filed for this visit.  Subjective Assessment - 08/30/18 1345    Subjective   Courtney Henderson is referred to Occupational Therapy  for evaluation and treatment of BLE lymphedema. Pt reports insideous onset ~ 5 years ago coinsiding with DM type 2 dx. Pt reports sweling has gotten progressively over time. She reports LLE is worse than the R, and swelling often fluctuates. Pt has difficulty donning traditional elastic compression stockings.  Pt reports she uses shiatzu massage devices she uses nightly unless her legs are too swollen, and then it's too painful. Pt denies family hx of leg swelling. She denies hx of blood clotts , infections and hx of non-healing wounds. Pt states, "I dont know anything about LE treatment. I'm here to gather information. I'm would love to be able to walk without holding on and care for my grandkids."    Pertinent History  Chronic pain syndrome, diabetic polyneuropathy, CVI, DM-type 2, lumbar facet arthropathy, lumbar radiculopathy, lumba spondylosis, polyarthralgia, asthma, glaucoma, chronic proximal bilateral platar fasciitis, current every day smoker, obesity )~223#/ 5'3"/ BMI ~ 39.54 kg/m2)    Limitations  decreased AROM,  numbness, difficulty walking, decreased standing, sitting and walking tolerance, impaired transfers and functional mobility ( bending, twisting, lying down),    Repetition  Increases Symptoms    Special Tests  07/16/15 Doppler study nevative for DVT and CVI. I see Dr Al CorpusHyatt recommended vascular evaluation on 7/22, 20, but [facility].    Currently in Pain?  Yes    Pain Type  Chronic pain    Pain Onset  Other (comment)   chronic, progressive   Aggravating Factors   standing, walking, sitting, lying down, reporitioning, obesity, leg swelling    Pain Relieving Factors  medication, nerve block    Effect of Pain on Daily Activities  limits functional performance with basic and instrumental ADLs, productive activities/ work, leisure pursuits and social participation, limits participation in exercise and weight loss  regimes    Multiple Pain Sites  Yes     Mild stage II, BLE lymphedema secondary to suspected CVI and obesity Skin  Description Hyper-Keratosis Peau' de Orange Shiny Tight Fibrotic Fatty Doughy Indurated    slight x x mild   brawny   Hydration Dry Flaky Erythema Macerated   mild  x    Color Redness Present Pallor Blanching Hemosiderin Staining Other   Mild at distal legs, R>L  x      Odor Malodorous Yeast Fungal infection  Absent      x   Temperature Warm Cool wnl    x  Pitting Edema   1+ 2+ 3+ 4+ Non-pitting      x     Girth Symmetrical Asymmetrical Other Distribution    x L>R   Stemmer Sign Positive Negative     +    Lymphorrea History Of:  Present Absent    x     Wounds History Of Present Absent Venous Arterial Pressure Size   x x           Signs of Infection Redness Warmth Erythema Acute Swelling Drainage Borders     xx              Scars   Adhesions Hypersensitivity          Sensation Light Touch Deep pressure Hypersensitivty   Present Impaired Present Impaired Absent Impaired    polyneuropathy      x  Nails WNL  Fungus Other   x  TBA  x Hair Growth Symmetrical Asymmetrical   x    Skin Creases Base of toes  Ankles   Base of Fingers Medial Thighs              x                LYMPHEDEMA/ONCOLOGY QUESTIONNAIRE - 08/30/18 1728      What other symptoms do you have   Are you Having Heaviness or Tightness  Yes    Are you having Pain  Yes    Are you having pitting edema  Yes    Body Site  L>R    Is it Hard or Difficult finding clothes that fit  Yes    Do you have infections  No   shoes   Is there Decreased scar mobility  Yes    Stemmer Sign  Yes    Other Symptoms  hx lymphorrhea      Lymphedema Assessments   Lymphedema Assessments  Lower extremities                    OT Education - 08/30/18 1416    Education Details  Provided Pt and family education regarding lymphatic structure and function, etiologies, onset patterns and stages of progression. Discussed  impact of obesity on lymphatic system function. Outlined Complete Decongestive Therapy (CDT)  as standard of care and provided in depth information regarding 4 primary components of both Intensive and Self Management Phases, including Manual Lymph Drainage (MLD), compression wrapping and garments, skin care, and therapeutic exercise.   Homero FellersFrank discussion of high burden of care and in this case, need for consistent , daily caregiver assistance for optimal clinical outcome. Discussed  Importance of daily, ongoing LE self-care essential to retaining clinical gains and limiting progression.  Lastly, reviewed lymphedema precautions, including cellulitis risk and difficulty with wound healing. Provided printed Lymphedema Workbook for reference.    Person(s) Educated  Patient    Methods  Explanation;Demonstration;Handout    Comprehension  Verbalized understanding;Returned demonstration;Need further instruction          OT Long Term Goals - 08/30/18 1424      OT LONG TERM GOAL #1   Title  Pt will be able to verbalize signs  and symptoms of cellulitis infection, and be able to name 6 common lymphedema precautions using printed resource for reference (modified independence) to limit LE progression and infection risk.    Baseline  Max A    Time  4    Period  Days    Status  New  Target Date  --   4th OT Rx visit     OT LONG TERM GOAL #2   Title  Pt will be able to apply BLE, knee length, multi-layer, short stretch compression wraps using correct gradient techniques with max caregiver assistance   to achieve optimal limb volume reduction, and to return affected limb , as closely as possible, to premorbid size and shape to fit street shoes and footwear.    Baseline  Max A    Time  4    Period  Days    Status  New    Target Date  --   4th OT Rx visit     OT LONG TERM GOAL #3   Title  Pt to achieve no less than 10% BLE limb volume reductions below knees (ankle- tibial tuberosity A-D) during Intensive Phase CDT to improve AROM at LE joints for functional activities, ambulation and transfers, to reduce infection risk, and to limit LE progression.    Baseline  Max A    Time  12    Period  Weeks    Status  New    Target Date  11/28/18      OT LONG TERM GOAL #4   Title  With Max caregiver assistance Pt will achieve and sustain no less than 85% compliance with daily LE self-care home program components throughout Intensive Phase CDT, including daily  skin care, lymphatic pumping therex, daily simple self MLD, and 23/7 gradient   compression wrapping  to ensure optimal limb volume reduction, to improve independence with ADLs performance, and to increase safe ambulation wearing street shoes.    Baseline  Max A    Time  12    Period  Weeks    Status  New      OT LONG TERM GOAL #5   Title  Pt will demonstrate modified independence with donning and doffing recommended compression garments/ devices using assistive devices and extra time  during LE self-care training to reduce and control leg swelling and to limit LE  progression over time.    Baseline  Max A    Time  12    Period  Weeks    Status  New    Target Date  11/28/18            Plan - 08/31/18 1452    Clinical Impression Statement  Courtney Henderson is a 58  y o female presenting with mild, stage 2, BLE lymphedema 2/2 suspected chronic venous insufficiency and obesity. Contributing factors include secondary dependent positioning sedentary lifestyle, and decreased mobility and activity level due to chronic Henderson pain. Courtney Henderson reports onset of leg swelling about 5 years ago at same time she was diagnosed with type 2 DM. Condition has progressed over time with mild subcutaneous fibrosis palpable within areas of pitting edema.  Chronic, BLE LE exacerbates pain during ambulation and transfers and contributes to difficulty walking.  Chronic leg swelling limits Pt's ability to perform basic and instrumental ADLs (bathing, LB dressing, fitting shoes, skin and nail care) , productive activities and leisure pursuits that require standing, walking or sitting longer than 10 minutes. Leg swelling and pain limit social participation and role performance at home and in the community. Courtney Henderson will benefit from skilled Occupational Therapy for Intensive and Management Phase Complete Decongestive Therapy (CDT) , to include manual lymphatic drainage (MLD) , skin care, therapeutic exercise and compression therapy. Emphasis throughout OT course will also focus on Pt education for  long term LE self-care. Pt will require maximum assistance w/ gradient compression wrapping during visit intervals due to Henderson pain and decreased AROM for optimal good prognosis of lymphedema reduction and improved functional outcomes. Without skilled OT for CDT LE will progress resulting in further functional decline and increased risk of infection and wounds.    OT Occupational Profile and History  Comprehensive Assessment- Review of records and extensive additional review of physical,  cognitive, psychosocial history related to current functional performance    Occupational performance deficits (Please refer to evaluation for details):  ADL's;IADL's;Leisure;Rest and Sleep;Social Participation    Body Structure / Function / Physical Skills  ADL;Edema;Obesity;ROM;Skin integrity;Decreased knowledge of use of DME;Pain;Strength;Sensation;IADL    Rehab Potential  Good    Clinical Decision Making  Multiple treatment options, significant modification of task necessary    Comorbidities Affecting Occupational Performance:  Presence of comorbidities impacting occupational performance    Comorbidities impacting occupational performance description:  see SUBJECTIVE    Modification or Assistance to Complete Evaluation   Max significant modification of tasks or assist is necessary to complete    OT Frequency  2x / week   3 x weekly is optimal but pt refuses this frequency   OT Duration  12 weeks    OT Treatment/Interventions  Self-care/ADL training;Functional Mobility Training;Manual lymph drainage;Therapeutic activities;Patient/family education;Compression bandaging;Manual Therapy;DME and/or AE instruction    Plan  CDT to one leg at a time to decrease falls risk. CDT to include manual lymphatic drainage (MLD), compression therapy ( short stretch wraps and garments once swelling is decreased), skin care and therapeutic exercise.    Recommended Other Services  Pt should be fir with appropriate compression garments/ devices that she is able to don    with modified independence, or alternative velcro wrap garments that she is able to don and doff herself in standing position due to Henderson pain    Consulted and Agree with Plan of Care  Patient       Patient will benefit from skilled therapeutic intervention in order to improve the following deficits and impairments:   Body Structure / Function / Physical Skills: ADL, Edema, Obesity, ROM, Skin integrity, Decreased knowledge of use of DME, Pain,  Strength, Sensation, IADL       Visit Diagnosis: 1. Lymphedema, not elsewhere classified       Problem List Patient Active Problem List   Diagnosis Date Noted  . Lumbar facet arthropathy 03/17/2018  . Lumbar spondylosis 03/17/2018  . Bilateral lower extremity edema 03/09/2018  . Lumbar radiculopathy 11/09/2017  . Chronic pain syndrome 11/09/2017  . Lumbar foraminal stenosis 11/09/2017  . Lumbosacral spondylosis with radiculopathy 07/13/2017  . Polyarthralgia 07/13/2017  . Diabetic polyneuropathy associated with type 2 diabetes mellitus (HCC) 07/13/2017   Courtney Dubonnetheresa Zacherie Honeyman, MS, OTR/L, [facility] 08/31/18 6:12 PM    [facility] [address] [place], [place], [address] Phone: [phone]   Fax:  [phone]  Name: Courtney LandmarkJayne R Smethurst MRN: [id number] Date of Birth: 1960/10/22

## 2018-09-14 ENCOUNTER — Ambulatory Visit: Payer: PRIVATE HEALTH INSURANCE | Admitting: Occupational Therapy

## 2018-09-14 ENCOUNTER — Other Ambulatory Visit: Payer: Self-pay

## 2018-09-14 ENCOUNTER — Encounter: Payer: PRIVATE HEALTH INSURANCE | Admitting: Occupational Therapy

## 2018-09-14 DIAGNOSIS — I890 Lymphedema, not elsewhere classified: Secondary | ICD-10-CM

## 2018-09-14 NOTE — Therapy (Signed)
[place] MAIN [facility] SERVICES [address] [place], [place], [address] Phone: [phone]   Fax:  [phone]  Occupational Therapy Treatment  Patient Details  Name: Courtney Henderson MRN: [id number] Date of Birth: 01/31/1960 Referring Provider (OT): Tyson Dense, [place]   Encounter Date: 09/14/2018    Past Medical History:  Diagnosis Date  . Diabetes mellitus without complication [facility])     Past Surgical History:  Procedure Laterality Date  . MULTIPLE TOOTH EXTRACTIONS  06/2018   2-3 weeks ago; 10 teeth    There were no vitals filed for this visit.  Subjective Assessment - 09/14/18 1650    Subjective   Courtney Henderson presents for Occupational Therapy treatment visit 2/ 36 to address BLE phlebolymphedema. Pt has no new complaints since initial evaluation several weeks ago. She reports today that she is not yet sure if she will attend OT 2-3 x weekly as recommended for CDT until she is able to research inxurance          coverage    more thouroughly. She explains that she and her spouse are self-employed and on a very tight budget at present.    Pertinent History  Chronic pain syndrome, diabetic polyneuropathy, CVI, DM-type 2, lumbar facet arthropathy, lumbar radiculopathy, lumba spondylosis, polyarthralgia, asthma, glaucoma, chronic proximal bilateral platar fasciitis, current every day smoker, obesity )~223#/ 5'3"/ BMI ~ 39.54 kg/m2)    Limitations  decreased AROM, numbness, difficulty walking, decreased standing, sitting and walking tolerance, impaired transfers and functional mobility ( bending, twisting, lying down),    Repetition  Increases Symptoms    Special Tests  07/16/15 Doppler study nevative for DVT and CVI. I see Dr Milinda Pointer recommended vascular evaluation on 7/22, 20, but no results available in  [facility] EMR.    Pain Onset  Other (comment)   chronic, progressive         LYMPHEDEMA/ONCOLOGY QUESTIONNAIRE - 09/14/18 1654      Right Lower  Extremity Lymphedema   Other  RLE limb volume from ankle to tibial tuberosity (A-D ) measures 3672.54 ml. RLE limb volume for thigh  from knee to groin (E-G) measures 3035.16 ml. Full  RLE limb volume from ankle to groin measures 6730.70 ml. The RLE is the dominant leg.      Left Lower Extremity Lymphedema   Other  LLE limb volume from ankle to tibial tuberosity (A-D ) measures 3866.26 ml. LLE limb volume for thigh  from knee to groin (E-G) measures 3143.56 ml. Full  LLE limb volume from ankle to groin measures 7009.83 ml.     Other  Limb volume differential (LVD ) for A-D     landmarks measures 5.01%, L>R. E-G LVD measures 3.45%, L>R. LVD for full legs from A-G measures 3.98%.              OT Treatments/Exercises (OP) - 09/14/18 0001      ADLs   ADL Education Given  Yes      Manual Therapy   Manual Therapy  Edema management;Compression Bandaging    Edema Management  BLE initial limb volumetrics    Compression Bandaging  Knee length gradient compression wrap to LLE               using 8, 10 and 12 cm short stretch wraps over single layer of Rosidal foam and cotton stockinett.                  OT Long Term Goals -  08/30/18 1424      OT LONG TERM GOAL #1   Title  Pt will be able to verbalize signs and symptoms of cellulitis infection, and be able to name 6 common lymphedema precautions using printed resource for reference (modified independence) to limit LE progression and infection risk.    Baseline  Max A    Time  4    Period  Days    Status  New    Target Date  --   4th OT Rx visit     OT LONG TERM GOAL #2   Title  Pt will be able to apply BLE, knee length, multi-layer, short stretch compression wraps using correct gradient techniques with max caregiver assistance   to achieve optimal limb volume reduction, and to return affected limb , as closely as possible, to premorbid size and shape to fit street shoes and footwear.    Baseline  Max A    Time  4    Period   Days    Status  New    Target Date  --   4th OT Rx visit     OT LONG TERM GOAL #3   Title  Pt to achieve no less than 10% BLE limb volume reductions below knees (ankle- tibial tuberosity A-D) during Intensive Phase CDT to improve AROM at LE joints for functional activities, ambulation and transfers, to reduce infection risk, and to limit LE progression.    Baseline  Max A    Time  12    Period  Weeks    Status  New    Target Date  11/28/18      OT LONG TERM GOAL #4   Title  With Max caregiver assistance Pt will achieve and sustain no less than 85% compliance with daily LE self-care home program components throughout Intensive Phase CDT, including daily  skin care, lymphatic pumping therex, daily simple self MLD, and 23/7 gradient   compression wrapping  to ensure optimal limb volume reduction, to improve independence with ADLs performance, and to increase safe ambulation wearing street shoes.    Baseline  Max A    Time  12    Period  Weeks    Status  New      OT LONG TERM GOAL #5   Title  Pt will demonstrate modified independence with donning and doffing recommended compression garments/ devices using assistive devices and extra time  during LE self-care training to reduce and control leg swelling and to limit LE progression over time.    Baseline  Max A    Time  12    Period  Weeks    Status  New    Target Date  11/28/18            Plan - 09/14/18 1701    Clinical Impression Statement  Initial BLE comparative limb volumetrics reveal RLE limb volume from ankle to tibial tuberosity (A-D ) measures 3672.54 ml. RLE limb volume for thigh  from knee to groin (E-G) measures 3035.16 ml. Full  RLE limb volume from ankle to groin measures 6730.70 ml. The RLE is the dominant leg. LLE limb volume from ankle to tibial tuberosity (A-D ) measures 3866.26 ml. LLE limb volume for thigh  from knee to groin (E-G) measures 3143.56 ml. Full  LLE limb volume from ankle to groin measures 7009.83 ml.  Limb volume differential (LVD ) for A-D     landmarks measures 5.01%, L>R. E-G LVD measures 3.45%, L>R. LVD  for full legs from A-G measures 3.98%.Although it is easy to misinterpret this data as limb volumes are close to normal, when this data actually confirms in this case is that both legs are essentially symmentrically swollen at all segments, with LLE slightly worse than the RLE. Pt tolerated compression wraps from toes to L knee without difficulty in clinic. Despite modeling, verbal cues and printed handouit, she did not return demonstration or teach back lymphatic pumping ther ex in clinic. Pt unsure if she will be able to afford to return to clinic for lymphedema care due to 25% copays. She reports she will continue to research with her insurance provider and return if possible. Pt will benefit from OT for CDT. I hope it will be possible for her to participate in therapy in the future.    OT Occupational Profile and History  Comprehensive Assessment- Review of records and extensive additional review of physical, cognitive, psychosocial history related to current functional performance    Occupational performance deficits (Please refer to evaluation for details):  ADL's;IADL's;Leisure;Rest and Sleep;Social Participation    Body Structure / Function / Physical Skills  ADL;Edema;Obesity;ROM;Skin integrity;Decreased knowledge of use of DME;Pain;Strength;Sensation;IADL    Rehab Potential  Good    Clinical Decision Making  Multiple treatment options, significant modification of task necessary    Comorbidities Affecting Occupational Performance:  Presence of comorbidities impacting occupational performance    Comorbidities impacting occupational performance description:  see SUBJECTIVE    Modification or Assistance to Complete Evaluation   Max significant modification of tasks or assist is necessary to complete    OT Frequency  2x / week   3 x weekly is optimal but pt refuses this frequency   OT Duration   12 weeks    OT Treatment/Interventions  Self-care/ADL training;Functional Mobility Training;Manual lymph drainage;Therapeutic activities;Patient/family education;Compression bandaging;Manual Therapy;DME and/or AE instruction    Plan  CDT to one leg at a time to decrease falls risk. CDT to include manual lymphatic drainage (MLD), compression therapy ( short stretch wraps and garments once swelling is decreased), skin care and therapeutic exercise.    Recommended Other Services  Pt should be fir with appropriate compression garments/ devices that she is able to don    with modified independence, or alternative velcro wrap garments that she is able to don and doff herself in standing position due to back pain    Consulted and Agree with Plan of Care  Patient       Patient will benefit from skilled therapeutic intervention in order to improve the following deficits and impairments:   Body Structure / Function / Physical Skills: ADL, Edema, Obesity, ROM, Skin integrity, Decreased knowledge of use of DME, Pain, Strength, Sensation, IADL       Visit Diagnosis: Lymphedema, not elsewhere classified    Problem List Patient Active Problem List   Diagnosis Date Noted  . Lumbar facet arthropathy 03/17/2018  . Lumbar spondylosis 03/17/2018  . Bilateral lower extremity edema 03/09/2018  . Lumbar radiculopathy 11/09/2017  . Chronic pain syndrome 11/09/2017  . Lumbar foraminal stenosis 11/09/2017  . Lumbosacral spondylosis with radiculopathy 07/13/2017  . Polyarthralgia 07/13/2017  . Diabetic polyneuropathy associated with type 2 diabetes mellitus (HCC) 07/13/2017    Loel Dubonnetheresa , MS, OTR/L, [facility] 09/14/18 5:09 PM    [facility] MAIN [facility] [address] [place], [place], [address] Phone: [phone]   Fax:  [phone]  Name: Rush LandmarkJayne R Charlson MRN: [id number] Date of Birth: 09-Aug-1960

## 2018-09-21 ENCOUNTER — Ambulatory Visit (INDEPENDENT_AMBULATORY_CARE_PROVIDER_SITE_OTHER): Payer: PRIVATE HEALTH INSURANCE | Admitting: Podiatry

## 2018-09-21 ENCOUNTER — Other Ambulatory Visit: Payer: Self-pay

## 2018-09-21 ENCOUNTER — Encounter: Payer: Self-pay | Admitting: Podiatry

## 2018-09-21 DIAGNOSIS — M722 Plantar fascial fibromatosis: Secondary | ICD-10-CM

## 2018-09-21 NOTE — Progress Notes (Signed)
She presents today chief complaint of bilateral heel pain.  Objective: She has plantar fasciitis with pain on palpation mid fascia medial aspect bilateral foot.  Assessment: Medial band plantar fasciitis centrally located in the foot.  Plan: Inject the area today with 10 mg Kenalog 5 mg Marcaine.  Tolerated procedure well.

## 2018-10-12 ENCOUNTER — Encounter: Payer: Self-pay | Admitting: Student in an Organized Health Care Education/Training Program

## 2018-10-17 ENCOUNTER — Other Ambulatory Visit: Payer: Self-pay

## 2018-10-17 ENCOUNTER — Ambulatory Visit
Payer: PRIVATE HEALTH INSURANCE | Attending: Student in an Organized Health Care Education/Training Program | Admitting: Student in an Organized Health Care Education/Training Program

## 2018-10-17 ENCOUNTER — Encounter: Payer: Self-pay | Admitting: Student in an Organized Health Care Education/Training Program

## 2018-10-17 DIAGNOSIS — M4727 Other spondylosis with radiculopathy, lumbosacral region: Secondary | ICD-10-CM

## 2018-10-17 DIAGNOSIS — M47816 Spondylosis without myelopathy or radiculopathy, lumbar region: Secondary | ICD-10-CM

## 2018-10-17 DIAGNOSIS — G894 Chronic pain syndrome: Secondary | ICD-10-CM | POA: Diagnosis not present

## 2018-10-17 DIAGNOSIS — M5416 Radiculopathy, lumbar region: Secondary | ICD-10-CM | POA: Diagnosis not present

## 2018-10-17 MED ORDER — GABAPENTIN 300 MG PO CAPS: 600.0000 mg | ORAL_CAPSULE | Freq: Two times a day (BID) | ORAL | 5 refills | Status: DC

## 2018-10-17 MED ORDER — PREDNISONE 20 MG PO TABS: ORAL_TABLET | ORAL | 0 refills | Status: AC

## 2018-10-17 NOTE — Progress Notes (Signed)
Pain Management Virtual Encounter Note - Virtual Visit via [facility] (real-time audio visits between healthcare provider and patient).   Patient's Phone No. & Preferred Pharmacy:  [phone] (home); [phone] (mobile); (Preferred) [phone] No e-mail address on record  [id number] [place], [place] - [address] [place] [address] Phone: [phone] Fax: [phone]    Pre-screening note:  Our staff contacted Ms. Olson and offered her an "in person", "face-to-face" appointment versus a telephone encounter. She indicated preferring the telephone encounter, at this time.   Reason for Virtual Visit: COVID-19*  Social distancing based on CDC and AMA recommendations.   I contacted CYDNIE DEASON on 10/17/2018 via video conference.      I clearly identified myself as Gillis Santa, MD. I verified that I was speaking with the correct person using two identifiers (Name: BARBARA AHART, and date of birth: 1960/04/08).  Advanced Informed Consent I sought verbal advanced consent from Etter Sjogren for virtual visit interactions. I informed Ms. Sangalang of possible security and privacy concerns, risks, and limitations associated with providing "not-in-person" medical evaluation and management services. I also informed Ms. Sapien of the availability of "in-person" appointments. Finally, I informed her that there would be a charge for the virtual visit and that she could be  personally, fully or partially, financially responsible for it. Ms. Lizana expressed understanding and agreed to proceed.   Historic Elements   Courtney Henderson is a 58 y.o. year old, female patient evaluated today after her last encounter by our practice on 08/18/2018. Ms. Ransier  has a past medical history of Diabetes mellitus without complication ([place]). She also  has a past surgical history that includes Multiple tooth extractions (06/2018). Ms. Gasser has a current medication  list which includes the following prescription(s): vitamin c, contour next ez monitor, breo ellipta, furosemide, gabapentin, glucose blood, ibuprofen, klor-con 10, latanoprost, magnesium, meloxicam, metformin, microlet lancets, mometasone, prolensa, timolol, glipizide, and prednisone. She  reports that she has been smoking. She has never used smokeless tobacco. She reports previous alcohol use. She reports that she does not use drugs. Ms. Dunlop is allergic to morphine and related and penicillins.   HPI  Today, she is being contacted for worsening of previously known (established) problem and MM.  Worsening low back pain, L>R. Has seen Dr Marry Guan with Dr Linton Flemings. Dr Marry Guan believes that her hip pain is related to her lumbar facet arthropathy.  Patient is finding benefit with gabapentin and has noticed increased pain when she only takes it once a day.  We discussed going back to 600 mg twice daily.  Risks and benefits reviewed.    Patient is now scheduled for left L3, L4, L5, S1 RFA November 11.  We will plan for right side after that.   UDS:  Summary  Date Value Ref Range Status  09/07/2017 FINAL  Final    Comment:    ==================================================================== [facility] COMP DRUG ANALYSIS,UR ==================================================================== Test                             Result       Flag       Units Drug Present and Declared for Prescription Verification   Gabapentin                     PRESENT      EXPECTED   Ibuprofen  PRESENT      EXPECTED Drug Present not Declared for Prescription Verification   Diphenhydramine                PRESENT      UNEXPECTED ==================================================================== Test                      Result    Flag   Units      Ref Range   Creatinine              130              mg/dL       >=30 ==================================================================== Declared Medications:  The flagging and interpretation on this report are based on the  following declared medications.  Unexpected results may arise from  inaccuracies in the declared medications.  **Note: The testing scope of this panel includes these medications:  Gabapentin (Neurontin)  **Note: The testing scope of this panel does not include small to  moderate amounts of these reported medications:  Ibuprofen (Advil)  **Note: The testing scope of this panel does not include following  reported medications:  Bromfenac (Prolensa)  Fluticasone (Breo)  Furosemide (Lasix)  Magnesium  Metformin  Mometasone  Potassium  Timolol  Vilanterol (Breo)  Vitamin C ==================================================================== For clinical consultation, please call [phone]. ====================================================================    Laboratory Chemistry Profile (12 mo)  Renal: No results found for requested labs within last 8760 hours.  No results found for: GFR, GFRAA, GFRNONAA Hepatic: No results found for requested labs within last 8760 hours. No results found for: AST, ALT Other: No results found for requested labs within last 8760 hours. Note: Above Lab results reviewed.   Assessment  The primary encounter diagnosis was Lumbar facet arthropathy. Diagnoses of Lumbar spondylosis, Lumbosacral spondylosis with radiculopathy, Lumbar radiculopathy, and Chronic pain syndrome were also pertinent to this visit.  Plan of Care  I have changed Sabre R. Mutschler's gabapentin. I am also having her start on predniSONE. Additionally, I am having her maintain her timolol, metFORMIN, ibuprofen, magnesium, Klor-Con 10, mometasone, furosemide, Breo Ellipta, CONTOUR NEXT EZ MONITOR, Prolensa, glucose blood, Microlet Lancets, vitamin C, glipiZIDE, latanoprost, and meloxicam.  Pharmacotherapy (Medications  Ordered): Meds ordered this encounter  Medications  . gabapentin (NEURONTIN) 300 MG capsule    Sig: Take 2 capsules (600 mg total) by mouth 2 (two) times daily.    Dispense:  120 capsule    Refill:  5  . predniSONE (DELTASONE) 20 MG tablet    Sig: Take 3 tablets (60 mg total) by mouth daily with breakfast for 3 days, THEN 2 tablets (40 mg total) daily with breakfast for 3 days, THEN 1 tablet (20 mg total) daily with breakfast for 3 days.    Dispense:  18 tablet    Refill:  0   Plan for left L3, L4, L5, S1 RFA November 30, 2018 pending insurance authorization.  Follow-up plan:   Return for Keep sch. appt.     s/p 2 diagnostic lumbar facet medial branch nerve blocks at bilateral L3, L4, L5, S1 on 04/06/2018 and 07/18/2018.  These procedures provided her with significant pain relief in regards to her axial low back and buttock pain; greater than 75% for approximately 4 weeks and ongoing from her most recent facet block.  umbar facet medial branch RFA L3, L4, L5, S1 starting with the left side first tentatively scheduled for November 30, 2018.    Recent Visits Date Type Provider Dept  08/18/18 Office Visit Edward JollyLateef, Keyleen Cerrato, MD [facility]  Showing recent visits within past 90 days and meeting all other requirements   Today's Visits Date Type Provider Dept  10/17/18 Office Visit Edward JollyLateef, Cornelius Schuitema, MD [facility]  Showing today's visits and meeting all other requirements   Future Appointments Date Type Provider Dept  11/30/18 Appointment Edward JollyLateef, Caoilainn Sacks, MD [facility]  Showing future appointments within next 90 days and meeting all other requirements   I discussed the assessment and treatment plan with the patient. The patient was provided an opportunity to ask questions and all were answered. The patient agreed with the plan and demonstrated an understanding of the instructions.  Patient advised to call back or seek an in-person evaluation if the symptoms or condition  worsens.  Total duration of non-face-to-face encounter: 15 minutes.  Note by: Edward JollyBilal Roisin Mones, MD Date: 10/17/2018; Time: 3:26 PM  Note: This dictation was prepared with Dragon dictation. Any transcriptional errors that may result from this process are unintentional.  Disclaimer:  * Given the special circumstances of the COVID-19 pandemic, the federal government has announced that the Office for Civil Rights (OCR) will exercise its enforcement discretion and will not impose penalties on physicians using telehealth in the event of noncompliance with regulatory requirements under the [employer] Insurance Portability and Accountability Act (HIPAA) in connection with the good faith provision of telehealth during the COVID-19 national public health emergency. (AMA)

## 2018-10-26 ENCOUNTER — Encounter: Payer: Self-pay | Admitting: Podiatry

## 2018-10-26 ENCOUNTER — Ambulatory Visit (INDEPENDENT_AMBULATORY_CARE_PROVIDER_SITE_OTHER): Payer: PRIVATE HEALTH INSURANCE | Admitting: Podiatry

## 2018-10-26 ENCOUNTER — Other Ambulatory Visit: Payer: Self-pay

## 2018-10-26 DIAGNOSIS — M722 Plantar fascial fibromatosis: Secondary | ICD-10-CM | POA: Diagnosis not present

## 2018-10-26 NOTE — Progress Notes (Signed)
She presents today for follow-up of her plantar fasciitis states that I think is about time for another set of injections.  My heels are starting to bother me again.  Objective: Vital signs are stable she is alert and oriented x3 pulses are palpable.  She has pain on palpation to the plantar medial foot just distal to the calcaneal insertion site bilateral heels.  Mild edema bilateral.  Dates that she is having radiofrequency ablation of her back in the next few days.  She states that she hopes she helps this helps with her feet.  Assessment: Plantar fasciitis bilateral.  Plan: Reinjected bilateral heels today with 10 mg Kenalog 5 mg Marcaine point of maximal tenderness bilateral.  Tolerated procedure well without complications.  Follow-up with her in a couple of months.

## 2018-11-22 ENCOUNTER — Telehealth: Payer: Self-pay

## 2018-11-22 DIAGNOSIS — M47816 Spondylosis without myelopathy or radiculopathy, lumbar region: Secondary | ICD-10-CM

## 2018-11-22 NOTE — Telephone Encounter (Signed)
The patient states that she talked to Dr. Holley Raring about getting the right sided RFA done on 12/14/18.  Left side will be done on 11/30/18 There is no order for right RFA  so if Dr. Holley Raring wants me to go ahead and try to get prior auth for the other side,  please put an order in. It can take up to 2 weeks to get approval. Thanks

## 2018-11-30 ENCOUNTER — Ambulatory Visit (HOSPITAL_BASED_OUTPATIENT_CLINIC_OR_DEPARTMENT_OTHER): Payer: PRIVATE HEALTH INSURANCE | Admitting: Student in an Organized Health Care Education/Training Program

## 2018-11-30 ENCOUNTER — Encounter: Payer: Self-pay | Admitting: Student in an Organized Health Care Education/Training Program

## 2018-11-30 ENCOUNTER — Other Ambulatory Visit: Payer: Self-pay

## 2018-11-30 ENCOUNTER — Ambulatory Visit
Admission: RE | Admit: 2018-11-30 | Discharge: 2018-11-30 | Disposition: A | Discharge status: Home / Self Care | Payer: PRIVATE HEALTH INSURANCE | Source: Ambulatory Visit | Attending: Student in an Organized Health Care Education/Training Program | Admitting: Student in an Organized Health Care Education/Training Program

## 2018-11-30 DIAGNOSIS — M47816 Spondylosis without myelopathy or radiculopathy, lumbar region: Secondary | ICD-10-CM | POA: Diagnosis not present

## 2018-11-30 MED ORDER — FENTANYL CITRATE (PF) 100 MCG/2ML IJ SOLN
25.0000 ug | INTRAMUSCULAR | Status: DC | PRN
  Administered 2018-11-30: 10:00:00 50 ug via INTRAVENOUS
  Filled 2018-11-30: qty 2

## 2018-11-30 MED ORDER — DEXAMETHASONE SODIUM PHOSPHATE 10 MG/ML IJ SOLN
10.0000 mg | Freq: Once | INTRAMUSCULAR | Status: AC
  Administered 2018-11-30: 10 mg
  Filled 2018-11-30: qty 1

## 2018-11-30 MED ORDER — LIDOCAINE HCL 2 % IJ SOLN
20.0000 mL | Freq: Once | INTRAMUSCULAR | Status: AC
  Administered 2018-11-30: 400 mg
  Filled 2018-11-30: qty 20

## 2018-11-30 MED ORDER — IOHEXOL 180 MG/ML  SOLN
10.0000 mL | Freq: Once | INTRAMUSCULAR | Status: DC
  Filled 2018-11-30: qty 20

## 2018-11-30 MED ORDER — ROPIVACAINE HCL 2 MG/ML IJ SOLN
9.0000 mL | Freq: Once | INTRAMUSCULAR | Status: AC
  Administered 2018-11-30: 9 mL via PERINEURAL
  Filled 2018-11-30: qty 10

## 2018-11-30 MED ORDER — MIDAZOLAM HCL 5 MG/5ML IJ SOLN
1.0000 mg | INTRAMUSCULAR | Status: DC | PRN
  Administered 2018-11-30: 2 mg via INTRAVENOUS
  Filled 2018-11-30: qty 5

## 2018-11-30 NOTE — Patient Instructions (Signed)
Post-procedure Information What to expect: Most procedures involve the use of a local anesthetic (numbing medicine), and a steroid (anti-inflammatory medicine).  The local anesthetics may cause temporary numbness and weakness of the legs or arms, depending on the location of the block. This numbness/weakness may last 4-6 hours, depending on the local anesthetic used. In rare instances, it can last up to 24 hours. While numb, you must be very careful not to injure the extremity.  After any procedure, you could expect the pain to get better within 15-20 minutes. This relief is temporary and may last 4-6 hours. Once the local anesthetics wears off, you could experience discomfort, possibly more than usual, for up to 10 (ten) days. In the case of radiofrequencies, it may last up to 6 weeks. Surgeries may take up to 8 weeks for the healing process. The discomfort is due to the irritation caused by needles going through skin and muscle. To minimize the discomfort, we recommend using ice the first day, and heat from then on. The ice should be applied for 15 minutes on, and 15 minutes off. Keep repeating this cycle until bedtime. Avoid applying the ice directly to the skin, to prevent frostbite. Heat should be used daily, until the pain improves (4-10 days). Be careful not to burn yourself.  Occasionally you may experience muscle spasms or cramps. These occur as a consequence of the irritation caused by the needle sticks to the muscle and the blood that will inevitably be lost into the surrounding muscle tissue. Blood tends to be very irritating to tissues, which tend to react by going into spasm. These spasms may start the same day of your procedure, but they may also take days to develop. This late onset type of spasm or cramp is usually caused by electrolyte imbalances triggered by the steroids, at the level of the kidney. Cramps and spasms tend to respond well to muscle relaxants, multivitamins (some are  triggered by the procedure, but may have their origins in vitamin deficiencies), and "Gatorade", or any sports drinks that can replenish any electrolyte imbalances. (If you are a diabetic, ask your pharmacist to get you a sugar-free brand.) Warm showers or baths may also be helpful. Stretching exercises are highly recommended. General Instructions:  Be alert for signs of possible infection: redness, swelling, heat, red streaks, elevated temperature, and/or fever. These typically appear 4 to 6 days after the procedure. Immediately notify your doctor if you experience unusual bleeding, difficulty breathing, or loss of bowel or bladder control. If you experience increased pain, do not increase your pain medicine intake, unless instructed by your pain physician. Post-Procedure Care:  Be careful in moving about. Muscle spasms in the area of the injection may occur. Applying ice or heat to the area is often helpful. The incidence of spinal headaches after epidural injections ranges between 1.4% and 6%. If you develop a headache that does not seem to respond to conservative therapy, please let your physician know. This can be treated with an epidural blood patch.   Post-procedure numbness or redness is to be expected, however it should average 4 to 6 hours. If numbness and weakness of your extremities begins to develop 4 to 6 hours after your procedure, and is felt to be progressing and worsening, immediately contact your physician.   Diet:  If you experience nausea, do not eat until this sensation goes away. If you had a "Stellate Ganglion Block" for upper extremity "Reflex Sympathetic Dystrophy", do not eat or drink until your   hoarseness goes away. In any case, always start with liquids first and if you tolerate them well, then slowly progress to more solid foods. Activity:  For the first 4 to 6 hours after the procedure, use caution in moving about as you may experience numbness and/or weakness. Use caution in  cooking, using household electrical appliances, and climbing steps. If you need to reach your Doctor call our office: [phone] Monday-Thursday 8:00 am - 4:00 PM    Fridays: Closed     In case of an emergency: In case of emergency, call 911 or go to the nearest emergency room and have the physician there call us.  Interpretation of Procedure Every nerve block has two components: a diagnostic component, and a treatment component. Unrealistic expectations are the most common causes of "perceived failure".  In a perfect world, a single nerve block should be able to completely and permanently eliminate the pain. Sadly, the world is not perfect.  Most pain management nerve blocks are performed using local anesthetics and steroids. Steroids are responsible for any long-term benefit that you may experience. Their purpose is to decrease any chronic swelling that may exist in the area. Steroids begin to work immediately after being injected. However, most patients will not experience any benefits until 5 to 10 days after the injection, when the swelling has come down to the point where they can tell a difference. Steroids will only help if there is swelling to be treated. As such, they can assist with the diagnosis. If effective, they suggest an inflammatory component to the pain, and if ineffective, they rule out inflammation as the main cause or component of the problem. If the problem is one of mechanical compression, you will get no benefit from those steroids.   In the case of local anesthetics, they have a crucial role in the diagnosis of your condition. Most will begin to work within15 to 20 minutes after injection. The duration will depend on the type used (short- vs. Long-acting). It is of outmost importance that patients keep tract of their pain, after the procedure. To assist with this matter, a "Post-procedure Pain Diary" is provided. Make sure to complete it and to bring it back to your  follow-up appointment.  As long as the patient keeps accurate, detailed records of their symptoms after every procedure, and returns to have those interpreted, every procedure will provide us with invaluable information. Even a block that does not provide the patient with any relief, will always provide us with information about the mechanism and the origin of the pain. The only time a nerve block can be considered a waste of time is when patients do not keep track of the results, or do not keep their post-procedure appointment.  Reporting the results back to your physician The Pain Score  Pain is a subjective complaint. It cannot be seen, touched, or measured. We depend entirely on the patient's report of the pain in order to assess your condition and treatment. To evaluate the pain, we use a pain scale, where "0" means "No Pain", and a "10" is "the worst possible pain that you can even imagine" (i.e. something like been eaten alive by a shark or being torn apart by a lion).   You will frequently be asked to rate your pain. Please be as accurate, remember that medical decisions will be based on your responses. Please do not rate your pain above a 10. Doing so is actually interpreted as "symptom magnification" (exaggeration), as   well as lack of understanding with regards to the scale. To put this into perspective, when you tell us that your pain is at a 10 (ten), what you are saying is that there is nothing we can do to make this pain any worse. (Carefully think about that.) Preparing for Procedure with Sedation Instructions: . Oral Intake: Do not eat or drink anything for at least 8 hours prior to your procedure. . Transportation: Public transportation is not allowed. Bring an adult driver. The driver must be physically present in our waiting room before any procedure can be started. . Physical Assistance: Bring an adult capable of physically assisting you, in the event you need help. . Blood Pressure  Medicine: Take your blood pressure medicine with a sip of water the morning of the procedure. . Insulin: Take only  of your normal insulin dose. . Preventing infections: Shower with an antibacterial soap the morning of your procedure. . Build-up your immune system: Take 1000 mg of Vitamin C with every meal (3 times a day) the day prior to your procedure. . Pregnancy: If you are pregnant, call and cancel the procedure. . Sickness: If you have a cold, fever, or any active infections, call and cancel the procedure. . Arrival: You must be in the facility at least 30 minutes prior to your scheduled procedure. . Children: Do not bring children with you. . Dress appropriately: Bring dark clothing that you would not mind if they get stained. . Valuables: Do not bring any jewelry or valuables. Procedure appointments are reserved for interventional treatments only. . No Prescription Refills. . No medication changes will be discussed during procedure appointments. . No disability issues will be discussed. .  

## 2018-11-30 NOTE — Progress Notes (Signed)
Safety precautions to be maintained throughout the outpatient stay will include: orient to surroundings, keep bed in low position, maintain call bell within reach at all times, provide assistance with transfer out of bed and ambulation.  

## 2018-11-30 NOTE — Progress Notes (Signed)
Patient's Name: Courtney Henderson  MRN: [id number]  Referring Provider: Edward Jolly, MD  DOB: [birth year]  PCP: Corky Downs, MD  DOS: 11/30/2018  Note by: Edward Jolly, MD  Service setting: Ambulatory outpatient  Specialty: Interventional Pain Management  Patient type: Established  Location: [facility] (AMB) Pain Management Facility  Visit type: Interventional Procedure   Primary Reason for Visit: Interventional Pain Management Treatment. CC: Back Pain  Procedure:          Anesthesia, Analgesia, Anxiolysis:  Type: Thermal Lumbar Facet, Medial Branch Radiofrequency Ablation/Neurotomy  #1  Primary Purpose: Therapeutic Region: Posterolateral Lumbosacral Spine Level:  L3, L4, L5, & S1 Medial Branch Level(s). These levels will denervate the L3-4, L4-5, and the L5-S1 lumbar facet joints. Laterality: Left  Type: Moderate (Conscious) Sedation combined with Local Anesthesia Indication(s): Analgesia and Anxiety Route: Intravenous (IV) IV Access: Secured Sedation: Meaningful verbal contact was maintained at all times during the procedure  Local Anesthetic: Lidocaine 1-2%  Position: Prone   Indications: 1. Lumbar facet arthropathy    Ms. Selvey has been dealing with the above chronic pain for longer than three months and has either failed to respond, was unable to tolerate, or simply did not get enough benefit from other more conservative therapies including, but not limited to: 1. Over-the-counter medications 2. Anti-inflammatory medications 3. Muscle relaxants 4. Membrane stabilizers 5. Opioids 6. Physical therapy and/or chiropractic manipulation 7. Modalities (Heat, ice, etc.) 8. Invasive techniques such as nerve blocks. Ms. Leu has attained more than 50% relief of the pain from a series of diagnostic injections conducted in separate occasions.  Pain Score: Pre-procedure: 8 /10 Post-procedure: 0-No pain/10  Pre-op Assessment:  Ms. Boxell is a 58 y.o. (year old), female patient, seen  today for interventional treatment. She  has a past surgical history that includes Multiple tooth extractions (06/2018). Ms. Hay has a current medication list which includes the following prescription(s): vitamin c, contour next ez monitor, breo ellipta, furosemide, gabapentin, glucose blood, ibuprofen, klor-con 10, latanoprost, metformin, microlet lancets, glipizide, magnesium, meloxicam, mometasone, prolensa, and timolol, and the following Facility-Administered Medications: fentanyl, iohexol, and midazolam. Her primarily concern today is the Back Pain  Initial Vital Signs:  Pulse/HCG Rate: 73ECG Heart Rate: 78 Temp: 97.6 F (36.4 C) Resp: 19 BP: (!) 144/91 SpO2: 100 %  BMI: Estimated body mass index is 38.11 kg/m as calculated from the following:   Height as of this encounter:  (1.626 m).   Weight as of this encounter: 222 lb (100.7 kg).  Risk Assessment: Allergies: Reviewed. She is allergic to morphine and related and penicillins.  Allergy Precautions: None required Coagulopathies: Reviewed. None identified.  Blood-thinner therapy: None at this time Active Infection(s): Reviewed. None identified. Ms. Rhudy is afebrile  Site Confirmation: Ms. Peto was asked to confirm the procedure and laterality before marking the site Procedure checklist: Completed Consent: Before the procedure and under the influence of no sedative(s), amnesic(s), or anxiolytics, the patient was informed of the treatment options, risks and possible complications. To fulfill our ethical and legal obligations, as recommended by the American Medical Association's Code of Ethics, I have informed the patient of my clinical impression; the nature and purpose of the treatment or procedure; the risks, benefits, and possible complications of the intervention; the alternatives, including doing nothing; the risk(s) and benefit(s) of the alternative treatment(s) or procedure(s); and the risk(s) and benefit(s) of doing  nothing. The patient was provided information about the general risks and possible complications associated with the procedure. These may  include, but are not limited to: failure to achieve desired goals, infection, bleeding, organ or nerve damage, allergic reactions, paralysis, and death. In addition, the patient was informed of those risks and complications associated to Spine-related procedures, such as failure to decrease pain; infection (i.e.: Meningitis, epidural or intraspinal abscess); bleeding (i.e.: epidural hematoma, subarachnoid hemorrhage, or any other type of intraspinal or peri-dural bleeding); organ or nerve damage (i.e.: Any type of peripheral nerve, nerve root, or spinal cord injury) with subsequent damage to sensory, motor, and/or autonomic systems, resulting in permanent pain, numbness, and/or weakness of one or several areas of the body; allergic reactions; (i.e.: anaphylactic reaction); and/or death. Furthermore, the patient was informed of those risks and complications associated with the medications. These include, but are not limited to: allergic reactions (i.e.: anaphylactic or anaphylactoid reaction(s)); adrenal axis suppression; blood sugar elevation that in diabetics may result in ketoacidosis or comma; water retention that in patients with history of congestive heart failure may result in shortness of breath, pulmonary edema, and decompensation with resultant heart failure; weight gain; swelling or edema; medication-induced neural toxicity; particulate matter embolism and blood vessel occlusion with resultant organ, and/or nervous system infarction; and/or aseptic necrosis of one or more joints. Finally, the patient was informed that Medicine is not an exact science; therefore, there is also the possibility of unforeseen or unpredictable risks and/or possible complications that may result in a catastrophic outcome. The patient indicated having understood very clearly. We have given  the patient no guarantees and we have made no promises. Enough time was given to the patient to ask questions, all of which were answered to the patient's satisfaction. Ms. Dicola has indicated that she wanted to continue with the procedure. Attestation: I, the ordering provider, attest that I have discussed with the patient the benefits, risks, side-effects, alternatives, likelihood of achieving goals, and potential problems during recovery for the procedure that I have provided informed consent. Date   Time: 11/30/2018  8:53 AM  Pre-Procedure Preparation:  Monitoring: As per clinic protocol. Respiration, ETCO2, SpO2, BP, heart rate and rhythm monitor placed and checked for adequate function Safety Precautions: Patient was assessed for positional comfort and pressure points before starting the procedure. Time-out: I initiated and conducted the "Time-out" before starting the procedure, as per protocol. The patient was asked to participate by confirming the accuracy of the "Time Out" information. Verification of the correct person, site, and procedure were performed and confirmed by me, the nursing staff, and the patient. "Time-out" conducted as per Joint Commission's Universal Protocol (UP.01.01.01). Time: 0940  Description of Procedure:          Laterality: Left Levels:  L3, L4, L5, & S1 Medial Branch Level(s), at the L3-4, L4-5, and the L5-S1 lumbar facet joints. Area Prepped: Lumbosacral Prepping solution: DuraPrep (Iodine Povacrylex [0.7% available iodine] and Isopropyl Alcohol, 74% w/w) Safety Precautions: Aspiration looking for blood return was conducted prior to all injections. At no point did we inject any substances, as a needle was being advanced. Before injecting, the patient was told to immediately notify me if she was experiencing any new onset of "ringing in the ears, or metallic taste in the mouth". No attempts were made at seeking any paresthesias. Safe injection practices and needle  disposal techniques used. Medications properly checked for expiration dates. SDV (single dose vial) medications used. After the completion of the procedure, all disposable equipment used was discarded in the proper designated medical waste containers. Local Anesthesia: Protocol guidelines were followed. The patient  was positioned over the fluoroscopy table. The area was prepped in the usual manner. The time-out was completed. The target area was identified using fluoroscopy. A 12-in long, straight, sterile hemostat was used with fluoroscopic guidance to locate the targets for each level blocked. Once located, the skin was marked with an approved surgical skin marker. Once all sites were marked, the skin (epidermis, dermis, and hypodermis), as well as deeper tissues (fat, connective tissue and muscle) were infiltrated with a small amount of a short-acting local anesthetic, loaded on a 10cc syringe with a 25G, 1.5-in  Needle. An appropriate amount of time was allowed for local anesthetics to take effect before proceeding to the next step. Local Anesthetic: Lidocaine 2.0% The unused portion of the local anesthetic was discarded in the proper designated containers. Technical explanation of process:  Radiofrequency Ablation (RFA)  L3 Medial Branch Nerve RFA: The target area for the L3 medial branch is at the junction of the postero-lateral aspect of the superior articular process and the superior, posterior, and medial edge of the transverse process of L4. Under fluoroscopic guidance, a Radiofrequency needle was inserted until contact was made with os over the superior postero-lateral aspect of the pedicular shadow (target area). Sensory and motor testing was conducted to properly adjust the position of the needle. Once satisfactory placement of the needle was achieved, the numbing solution was slowly injected after negative aspiration for blood. 2.0 mL of the nerve block solution was injected without difficulty or  complication. After waiting for at least 3 minutes, the ablation was performed. Once completed, the needle was removed intact. L4 Medial Branch Nerve RFA: The target area for the L4 medial branch is at the junction of the postero-lateral aspect of the superior articular process and the superior, posterior, and medial edge of the transverse process of L5. Under fluoroscopic guidance, a Radiofrequency needle was inserted until contact was made with os over the superior postero-lateral aspect of the pedicular shadow (target area). Sensory and motor testing was conducted to properly adjust the position of the needle. Once satisfactory placement of the needle was achieved, the numbing solution was slowly injected after negative aspiration for blood. 2.0 mL of the nerve block solution was injected without difficulty or complication. After waiting for at least 3 minutes, the ablation was performed. Once completed, the needle was removed intact. L5 Medial Branch Nerve RFA: The target area for the L5 medial branch is at the junction of the postero-lateral aspect of the superior articular process of S1 and the superior, posterior, and medial edge of the sacral ala. Under fluoroscopic guidance, a Radiofrequency needle was inserted until contact was made with os over the superior postero-lateral aspect of the pedicular shadow (target area). Sensory and motor testing was conducted to properly adjust the position of the needle. Once satisfactory placement of the needle was achieved, the numbing solution was slowly injected after negative aspiration for blood. 2.0 mL of the nerve block solution was injected without difficulty or complication. After waiting for at least 3 minutes, the ablation was performed. Once completed, the needle was removed intact. S1 Medial Branch Nerve RFA: The target area for the S1 medial branch is located inferior to the junction of the S1 superior articular process and the L5 inferior articular  process, posterior, inferior, and lateral to the 6 o'clock position of the L5-S1 facet joint, just superior to the S1 posterior foramen. Under fluoroscopic guidance, the Radiofrequency needle was advanced until contact was made with os over the Target  area. Sensory and motor testing was conducted to properly adjust the position of the needle. Once satisfactory placement of the needle was achieved, the numbing solution was slowly injected after negative aspiration for blood. 2.0 mL of the nerve block solution was injected without difficulty or complication. After waiting for at least 3 minutes, the ablation was performed. Once completed, the needle was removed intact. Radiofrequency lesioning (ablation):  Radiofrequency Generator: NeuroTherm [id number] Sensory Stimulation Parameters: 50 Hz was used to locate & identify the nerve, making sure that the needle was positioned such that there was no sensory stimulation below 0.3 V or above 0.7 V. Motor Stimulation Parameters: 2 Hz was used to evaluate the motor component. Care was taken not to lesion any nerves that demonstrated motor stimulation of the lower extremities at an output of less than 2.5 times that of the sensory threshold, or a maximum of 2.0 V. Lesioning Technique Parameters: Standard Radiofrequency settings. (Not bipolar or pulsed.) Temperature Settings: 80 degrees C Lesioning time: 60 seconds Intra-operative Compliance: Compliant Materials & Medications: Needle(s) (Electrode/Cannula) Type: Teflon-coated, curved tip, Radiofrequency needle(s) Gauge: 22G Length: 10cm Numbing solution: 10 cc solution made of 8 cc of 0.2% ropivacaine, 2 cc of Decadron 10 mg/cc.  2 cc injected at each level above on the left after sensorimotor testing, prior to ablation.  The unused portion of the solution was discarded in the proper designated containers.  Once the entire procedure was completed, the treated area was cleaned, making sure to leave some of the  prepping solution back to take advantage of its long term bactericidal properties.  Illustration of the posterior view of the lumbar spine and the posterior neural structures. Laminae of L2 through S1 are labeled. DPRL5, dorsal primary ramus of L5; DPRS1, dorsal primary ramus of S1; DPR3, dorsal primary ramus of L3; FJ, facet (zygapophyseal) joint L3-L4; I, inferior articular process of L4; LB1, lateral branch of dorsal primary ramus of L1; IAB, inferior articular branches from L3 medial branch (supplies L4-L5 facet joint); IBP, intermediate branch plexus; MB3, medial branch of dorsal primary ramus of L3; NR3, third lumbar nerve root; S, superior articular process of L5; SAB, superior articular branches from L4 (supplies L4-5 facet joint also); TP3, transverse process of L3.  Vitals:   11/30/18 1015 11/30/18 1024 11/30/18 1034 11/30/18 1044  BP: 129/73 127/76 129/73 140/73  Pulse: 70     Resp: 16 18 14 16   Temp:  97.8 F (36.6 C)    SpO2: 96% 96% 100% 100%  Weight:      Height:        Start Time: 0940 hrs. End Time:   hrs.  Imaging Guidance (Spinal):          Type of Imaging Technique: Fluoroscopy Guidance (Spinal) Indication(s): Assistance in needle guidance and placement for procedures requiring needle placement in or near specific anatomical locations not easily accessible without such assistance. Exposure Time: Please see nurses notes. Contrast: None used. Fluoroscopic Guidance: I was personally present during the use of fluoroscopy. "Tunnel Vision Technique" used to obtain the best possible view of the target area. Parallax error corrected before commencing the procedure. "Direction-depth-direction" technique used to introduce the needle under continuous pulsed fluoroscopy. Once target was reached, antero-posterior, oblique, and lateral fluoroscopic projection used confirm needle placement in all planes. Images permanently stored in EMR. Interpretation: No contrast injected. I  personally interpreted the imaging intraoperatively. Adequate needle placement confirmed in multiple planes. Permanent images saved into the patient's record.  Antibiotic Prophylaxis:  Anti-infectives (From admission, onward)   None     Indication(s): None identified  Post-operative Assessment:  Post-procedure Vital Signs:  Pulse/HCG Rate: 7075 Temp: 97.8 F (36.6 C) Resp: 16 BP: 140/73 SpO2: 100 %  EBL: None  Complications: No immediate post-treatment complications observed by team, or reported by patient.  Note: The patient tolerated the entire procedure well. A repeat set of vitals were taken after the procedure and the patient was kept under observation following institutional policy, for this type of procedure. Post-procedural neurological assessment was performed, showing return to baseline, prior to discharge. The patient was provided with post-procedure discharge instructions, including a section on how to identify potential problems. Should any problems arise concerning this procedure, the patient was given instructions to immediately contact us, at any time, without hesitation. In any case, we plan to contact the patient by telephone for a follow-up status report regarding this interventional procedure.  Comments:  No additional relevant information.  Plan of Care  Orders:  Orders Placed This Encounter  Procedures   Fluoro (C-Arm) (<60 min) (No Report)    Intraoperative interpretation by procedural physician at [facility].    Standing Status:   Standing    Number of Occurrences:   1    Order Specific Question:   Reason for exam:    Answer:   Assistance in needle guidance and placement for procedures requiring needle placement in or near specific anatomical locations not easily accessible without such assistance.   Medications ordered for procedure: Meds ordered this encounter  Medications   iohexol (OMNIPAQUE) 180 MG/ML injection 10 mL    Must be  Myelogram-compatible. If not available, you may substitute with a water-soluble, non-ionic, hypoallergenic, myelogram-compatible radiological contrast medium.   lidocaine (XYLOCAINE) 2 % (with pres) injection 400 mg   midazolam (VERSED) 5 MG/5ML injection 1-2 mg    Make sure Flumazenil is available in the pyxis when using this medication. If oversedation occurs, administer 0.2 mg IV over 15 sec. If after 45 sec no response, administer 0.2 mg again over 1 min; may repeat at 1 min intervals; not to exceed 4 doses (1 mg)   fentaNYL (SUBLIMAZE) injection 25-50 mcg    Make sure Narcan is available in the pyxis when using this medication. In the event of respiratory depression (RR< 8/min): Titrate NARCAN (naloxone) in increments of 0.1 to 0.2 mg IV at 2-3 minute intervals, until desired degree of reversal.   ropivacaine (PF) 2 mg/mL (0.2%) (NAROPIN) injection 9 mL   dexamethasone (DECADRON) injection 10 mg   dexamethasone (DECADRON) injection 10 mg   Medications administered: We administered lidocaine, midazolam, fentaNYL, ropivacaine (PF) 2 mg/mL (0.2%), dexamethasone, and dexamethasone.  See the medical record for exact dosing, route, and time of administration.  Follow-up plan:   Return for Keep sch. appt, Contra-lateral RFA.      s/p 2 diagnostic lumbar facet medial branch nerve blocks at bilateral L3, L4, L5, S1 on 04/06/2018 and 07/18/2018.  These procedures provided her with significant pain relief in regards to her axial low back and buttock pain; greater than 75% for approximately 4 weeks and ongoing from her most recent facet block.  lumbar facet medial branch RFA L3, L4, L5, S1 LEFT- November 30, 2018.     Recent Visits Date Type Provider Dept  10/17/18 Office Visit Edward JollyLateef, Khaliyah Northrop, MD [facility]  Showing recent visits within past 90 days and meeting all other requirements   Today's Visits Date Type Provider Dept  11/30/18  Procedure visit Edward Jolly, MD [facility]  Showing today's visits and meeting all other requirements   Future Appointments Date Type Provider Dept  12/14/18 Appointment Edward Jolly, MD [facility]  Showing future appointments within next 90 days and meeting all other requirements   Disposition: Discharge home  Discharge Date & Time: 11/30/2018; 1045 hrs.   Primary Care Physician: Corky Downs, MD Location: [facility] Note by: Edward Jolly, MD Date: 11/30/2018; Time: 12:38 PM  Disclaimer:  Medicine is not an exact science. The only guarantee in medicine is that nothing is guaranteed. It is important to note that the decision to proceed with this intervention was based on the information collected from the patient. The Data and conclusions were drawn from the patient's questionnaire, the interview, and the physical examination. Because the information was provided in large part by the patient, it cannot be guaranteed that it has not been purposely or unconsciously manipulated. Every effort has been made to obtain as much relevant data as possible for this evaluation. It is important to note that the conclusions that lead to this procedure are derived in large part from the available data. Always take into account that the treatment will also be dependent on availability of resources and existing treatment guidelines, considered by other Pain Management Practitioners as being common knowledge and practice, at the time of the intervention. For Medico-Legal purposes, it is also important to point out that variation in procedural techniques and pharmacological choices are the acceptable norm. The indications, contraindications, technique, and results of the above procedure should only be interpreted and judged by a Board-Certified Interventional Pain Specialist with extensive familiarity and expertise in the same exact procedure and technique.

## 2018-12-01 ENCOUNTER — Telehealth: Payer: Self-pay

## 2018-12-01 NOTE — Telephone Encounter (Signed)
Post procedure phone call.  LM 

## 2018-12-12 ENCOUNTER — Encounter: Payer: Self-pay | Admitting: Podiatry

## 2018-12-12 ENCOUNTER — Other Ambulatory Visit: Payer: Self-pay

## 2018-12-12 ENCOUNTER — Ambulatory Visit (INDEPENDENT_AMBULATORY_CARE_PROVIDER_SITE_OTHER): Payer: PRIVATE HEALTH INSURANCE | Admitting: Podiatry

## 2018-12-12 DIAGNOSIS — M722 Plantar fascial fibromatosis: Secondary | ICD-10-CM | POA: Diagnosis not present

## 2018-12-12 NOTE — Progress Notes (Signed)
She presents today for follow-up of plantar fasciitis states is been doing good but my 3-week out point the pain started to come back.  Objective: Vital signs are stable alert and oriented x3.  Pulses are palpable.  She has pain on palpation medial longitudinal arch along the base of the first met cuneiform articulation.  Assessment: Plan fasciitis.  Plan: Reinjected 10 mg each foot today with local anesthetic after sterile Betadine skin prep plantar medial aspect central midfoot.

## 2018-12-14 ENCOUNTER — Ambulatory Visit (HOSPITAL_BASED_OUTPATIENT_CLINIC_OR_DEPARTMENT_OTHER): Payer: PRIVATE HEALTH INSURANCE | Admitting: Student in an Organized Health Care Education/Training Program

## 2018-12-14 ENCOUNTER — Ambulatory Visit
Admission: RE | Admit: 2018-12-14 | Discharge: 2018-12-14 | Disposition: A | Discharge status: Home / Self Care | Payer: PRIVATE HEALTH INSURANCE | Source: Ambulatory Visit | Attending: Student in an Organized Health Care Education/Training Program | Admitting: Student in an Organized Health Care Education/Training Program

## 2018-12-14 ENCOUNTER — Other Ambulatory Visit: Payer: Self-pay

## 2018-12-14 ENCOUNTER — Encounter: Payer: Self-pay | Admitting: Student in an Organized Health Care Education/Training Program

## 2018-12-14 DIAGNOSIS — M47816 Spondylosis without myelopathy or radiculopathy, lumbar region: Secondary | ICD-10-CM | POA: Diagnosis not present

## 2018-12-14 MED ORDER — LIDOCAINE HCL 2 % IJ SOLN
20.0000 mL | Freq: Once | INTRAMUSCULAR | Status: AC
  Administered 2018-12-14: 400 mg

## 2018-12-14 MED ORDER — PREDNISONE 20 MG PO TABS: ORAL_TABLET | ORAL | 0 refills | Status: AC

## 2018-12-14 MED ORDER — ROPIVACAINE HCL 2 MG/ML IJ SOLN
9.0000 mL | Freq: Once | INTRAMUSCULAR | Status: AC
  Administered 2018-12-14: 09:00:00 9 mL via PERINEURAL

## 2018-12-14 MED ORDER — ROPIVACAINE HCL 2 MG/ML IJ SOLN: 2.0000 mL | Freq: Once | INTRAMUSCULAR | Status: DC

## 2018-12-14 MED ORDER — FENTANYL CITRATE (PF) 100 MCG/2ML IJ SOLN
25.0000 ug | INTRAMUSCULAR | Status: DC | PRN
  Administered 2018-12-14: 09:00:00 50 ug via INTRAVENOUS

## 2018-12-14 MED ORDER — ROPIVACAINE HCL 2 MG/ML IJ SOLN
INTRAMUSCULAR | Status: AC
  Filled 2018-12-14: qty 10

## 2018-12-14 MED ORDER — MIDAZOLAM HCL 5 MG/5ML IJ SOLN
1.0000 mg | INTRAMUSCULAR | Status: DC | PRN
  Administered 2018-12-14: 0.5 mg via INTRAVENOUS

## 2018-12-14 MED ORDER — LIDOCAINE HCL 2 % IJ SOLN
INTRAMUSCULAR | Status: AC
  Filled 2018-12-14: qty 20

## 2018-12-14 MED ORDER — DEXAMETHASONE SODIUM PHOSPHATE 10 MG/ML IJ SOLN
INTRAMUSCULAR | Status: AC
  Filled 2018-12-14: qty 1

## 2018-12-14 MED ORDER — DEXAMETHASONE SODIUM PHOSPHATE 10 MG/ML IJ SOLN
10.0000 mg | Freq: Once | INTRAMUSCULAR | Status: AC
  Administered 2018-12-14: 09:00:00 10 mg

## 2018-12-14 MED ORDER — FENTANYL CITRATE (PF) 100 MCG/2ML IJ SOLN
INTRAMUSCULAR | Status: AC
  Filled 2018-12-14: qty 2

## 2018-12-14 MED ORDER — DEXAMETHASONE SODIUM PHOSPHATE 10 MG/ML IJ SOLN
20.0000 mg | Freq: Once | INTRAMUSCULAR | Status: AC
  Administered 2018-12-14: 20 mg
  Filled 2018-12-14: qty 2

## 2018-12-14 MED ORDER — MIDAZOLAM HCL 5 MG/5ML IJ SOLN
INTRAMUSCULAR | Status: AC
  Filled 2018-12-14: qty 5

## 2018-12-14 NOTE — Patient Instructions (Signed)

## 2018-12-14 NOTE — Progress Notes (Signed)
Safety precautions to be maintained throughout the outpatient stay will include: orient to surroundings, keep bed in low position, maintain call bell within reach at all times, provide assistance with transfer out of bed and ambulation.  

## 2018-12-14 NOTE — Progress Notes (Signed)
Patient's Name: Courtney Henderson  MRN: [id number]  Referring Provider: Edward Jolly, MD  DOB: 1960/11/02  PCP: Corky Downs, MD  DOS: 12/14/2018  Note by: Edward Jolly, MD  Service setting: Ambulatory outpatient  Specialty: Interventional Pain Management  Patient type: Established  Location: [facility] (AMB) Pain Management Facility  Visit type: Interventional Procedure   Primary Reason for Visit: Interventional Pain Management Treatment. CC: Back Pain (low)  Procedure:          Anesthesia, Analgesia, Anxiolysis:  Type: Thermal Lumbar Facet, Medial Branch Radiofrequency Ablation/Neurotomy  #1  Primary Purpose: Therapeutic Region: Posterolateral Lumbosacral Spine Level:  L3, L4, L5, & S1 Medial Branch Level(s). These levels will denervate the L3-4, L4-5, and the L5-S1 lumbar facet joints. Laterality: Right  Type: Moderate (Conscious) Sedation combined with Local Anesthesia Indication(s): Analgesia and Anxiety Route: Intravenous (IV) IV Access: Secured Sedation: Meaningful verbal contact was maintained at all times during the procedure  Local Anesthetic: Lidocaine 1-2%  Position: Prone   Indications: 1. Lumbar facet arthropathy    Courtney Henderson has been dealing with the above chronic pain for longer than three months and has either failed to respond, was unable to tolerate, or simply did not get enough benefit from other more conservative therapies including, but not limited to: 1. Over-the-counter medications 2. Anti-inflammatory medications 3. Muscle relaxants 4. Membrane stabilizers 5. Opioids 6. Physical therapy and/or chiropractic manipulation 7. Modalities (Heat, ice, etc.) 8. Invasive techniques such as nerve blocks. Courtney Henderson has attained more than 50% relief of the pain from a series of diagnostic injections conducted in separate occasions.  Pain Score: Pre-procedure: 9 /10 Post-procedure: 0-No pain/10  Pre-op Assessment:  Courtney Henderson is a 58 y.o. (year old), female patient,  seen today for interventional treatment. She  has a past surgical history that includes Multiple tooth extractions (06/2018). Courtney Henderson has a current medication list which includes the following prescription(s): vitamin c, contour next ez monitor, breo ellipta, furosemide, gabapentin, glipizide, glucose blood, ibuprofen, klor-con 10, latanoprost, magnesium, meloxicam, metformin, microlet lancets, mometasone, prolensa, timolol, and prednisone, and the following Facility-Administered Medications: fentanyl, midazolam, and ropivacaine (pf) 2 mg/ml (0.2%). Her primarily concern today is the Back Pain (low)  Initial Vital Signs:  Pulse/HCG Rate: 75ECG Heart Rate: 75 Temp: 98 F (36.7 C) Resp: 18 BP: (!) 152/86 SpO2: 99 %  BMI: Estimated body mass index is 38.11 kg/m as calculated from the following:   Height as of this encounter:  (1.626 m).   Weight as of this encounter: 222 lb (100.7 kg).  Risk Assessment: Allergies: Reviewed. She is allergic to morphine and related and penicillins.  Allergy Precautions: None required Coagulopathies: Reviewed. None identified.  Blood-thinner therapy: None at this time Active Infection(s): Reviewed. None identified. Courtney Henderson is afebrile  Site Confirmation: Courtney Henderson was asked to confirm the procedure and laterality before marking the site Procedure checklist: Completed Consent: Before the procedure and under the influence of no sedative(s), amnesic(s), or anxiolytics, the patient was informed of the treatment options, risks and possible complications. To fulfill our ethical and legal obligations, as recommended by the American Medical Association's Code of Ethics, I have informed the patient of my clinical impression; the nature and purpose of the treatment or procedure; the risks, benefits, and possible complications of the intervention; the alternatives, including doing nothing; the risk(s) and benefit(s) of the alternative treatment(s) or procedure(s);  and the risk(s) and benefit(s) of doing nothing. The patient was provided information about the general risks and possible  complications associated with the procedure. These may include, but are not limited to: failure to achieve desired goals, infection, bleeding, organ or nerve damage, allergic reactions, paralysis, and death. In addition, the patient was informed of those risks and complications associated to Spine-related procedures, such as failure to decrease pain; infection (i.e.: Meningitis, epidural or intraspinal abscess); bleeding (i.e.: epidural hematoma, subarachnoid hemorrhage, or any other type of intraspinal or peri-dural bleeding); organ or nerve damage (i.e.: Any type of peripheral nerve, nerve root, or spinal cord injury) with subsequent damage to sensory, motor, and/or autonomic systems, resulting in permanent pain, numbness, and/or weakness of one or several areas of the body; allergic reactions; (i.e.: anaphylactic reaction); and/or death. Furthermore, the patient was informed of those risks and complications associated with the medications. These include, but are not limited to: allergic reactions (i.e.: anaphylactic or anaphylactoid reaction(s)); adrenal axis suppression; blood sugar elevation that in diabetics may result in ketoacidosis or comma; water retention that in patients with history of congestive heart failure may result in shortness of breath, pulmonary edema, and decompensation with resultant heart failure; weight gain; swelling or edema; medication-induced neural toxicity; particulate matter embolism and blood vessel occlusion with resultant organ, and/or nervous system infarction; and/or aseptic necrosis of one or more joints. Finally, the patient was informed that Medicine is not an exact science; therefore, there is also the possibility of unforeseen or unpredictable risks and/or possible complications that may result in a catastrophic outcome. The patient indicated having  understood very clearly. We have given the patient no guarantees and we have made no promises. Enough time was given to the patient to ask questions, all of which were answered to the patient's satisfaction. Courtney Henderson has indicated that she wanted to continue with the procedure. Attestation: I, the ordering provider, attest that I have discussed with the patient the benefits, risks, side-effects, alternatives, likelihood of achieving goals, and potential problems during recovery for the procedure that I have provided informed consent. Date   Time: 12/14/2018  7:52 AM  Pre-Procedure Preparation:  Monitoring: As per clinic protocol. Respiration, ETCO2, SpO2, BP, heart rate and rhythm monitor placed and checked for adequate function Safety Precautions: Patient was assessed for positional comfort and pressure points before starting the procedure. Time-out: I initiated and conducted the "Time-out" before starting the procedure, as per protocol. The patient was asked to participate by confirming the accuracy of the "Time Out" information. Verification of the correct person, site, and procedure were performed and confirmed by me, the nursing staff, and the patient. "Time-out" conducted as per Joint Commission's Universal Protocol (UP.01.01.01). Time: [phone]  Description of Procedure:          Laterality: Right Levels:  L3, L4, L5, & S1 Medial Branch Level(s), at the L3-4, L4-5, and the L5-S1 lumbar facet joints. Area Prepped: Lumbosacral Prepping solution: DuraPrep (Iodine Povacrylex [0.7% available iodine] and Isopropyl Alcohol, 74% w/w) Safety Precautions: Aspiration looking for blood return was conducted prior to all injections. At no point did we inject any substances, as a needle was being advanced. Before injecting, the patient was told to immediately notify me if she was experiencing any new onset of "ringing in the ears, or metallic taste in the mouth". No attempts were made at seeking any  paresthesias. Safe injection practices and needle disposal techniques used. Medications properly checked for expiration dates. SDV (single dose vial) medications used. After the completion of the procedure, all disposable equipment used was discarded in the proper designated medical waste containers. Local  Anesthesia: Protocol guidelines were followed. The patient was positioned over the fluoroscopy table. The area was prepped in the usual manner. The time-out was completed. The target area was identified using fluoroscopy. A 12-in long, straight, sterile hemostat was used with fluoroscopic guidance to locate the targets for each level blocked. Once located, the skin was marked with an approved surgical skin marker. Once all sites were marked, the skin (epidermis, dermis, and hypodermis), as well as deeper tissues (fat, connective tissue and muscle) were infiltrated with a small amount of a short-acting local anesthetic, loaded on a 10cc syringe with a 25G, 1.5-in  Needle. An appropriate amount of time was allowed for local anesthetics to take effect before proceeding to the next step. Local Anesthetic: Lidocaine 2.0% The unused portion of the local anesthetic was discarded in the proper designated containers. Technical explanation of process:  Radiofrequency Ablation (RFA)  L3 Medial Branch Nerve RFA: The target area for the L3 medial branch is at the junction of the postero-lateral aspect of the superior articular process and the superior, posterior, and medial edge of the transverse process of L4. Under fluoroscopic guidance, a Radiofrequency needle was inserted until contact was made with os over the superior postero-lateral aspect of the pedicular shadow (target area). Sensory and motor testing was conducted to properly adjust the position of the needle. Once satisfactory placement of the needle was achieved, the numbing solution was slowly injected after negative aspiration for blood. 2.0 mL of the nerve  block solution was injected without difficulty or complication. After waiting for at least 3 minutes, the ablation was performed. Once completed, the needle was removed intact. L4 Medial Branch Nerve RFA: The target area for the L4 medial branch is at the junction of the postero-lateral aspect of the superior articular process and the superior, posterior, and medial edge of the transverse process of L5. Under fluoroscopic guidance, a Radiofrequency needle was inserted until contact was made with os over the superior postero-lateral aspect of the pedicular shadow (target area). Sensory and motor testing was conducted to properly adjust the position of the needle. Once satisfactory placement of the needle was achieved, the numbing solution was slowly injected after negative aspiration for blood. 2.0 mL of the nerve block solution was injected without difficulty or complication. After waiting for at least 3 minutes, the ablation was performed. Once completed, the needle was removed intact. L5 Medial Branch Nerve RFA: The target area for the L5 medial branch is at the junction of the postero-lateral aspect of the superior articular process of S1 and the superior, posterior, and medial edge of the sacral ala. Under fluoroscopic guidance, a Radiofrequency needle was inserted until contact was made with os over the superior postero-lateral aspect of the pedicular shadow (target area). Sensory and motor testing was conducted to properly adjust the position of the needle. Once satisfactory placement of the needle was achieved, the numbing solution was slowly injected after negative aspiration for blood. 2.0 mL of the nerve block solution was injected without difficulty or complication. After waiting for at least 3 minutes, the ablation was performed. Once completed, the needle was removed intact. S1 Medial Branch Nerve RFA: The target area for the S1 medial branch is located inferior to the junction of the S1 superior  articular process and the L5 inferior articular process, posterior, inferior, and lateral to the 6 o'clock position of the L5-S1 facet joint, just superior to the S1 posterior foramen. Under fluoroscopic guidance, the Radiofrequency needle was advanced until contact  was made with os over the Target area. Sensory and motor testing was conducted to properly adjust the position of the needle. Once satisfactory placement of the needle was achieved, the numbing solution was slowly injected after negative aspiration for blood. 2.0 mL of the nerve block solution was injected without difficulty or complication. After waiting for at least 3 minutes, the ablation was performed. Once completed, the needle was removed intact. Radiofrequency lesioning (ablation):  Radiofrequency Generator: NeuroTherm [id number] Sensory Stimulation Parameters: 50 Hz was used to locate & identify the nerve, making sure that the needle was positioned such that there was no sensory stimulation below 0.3 V or above 0.7 V. Motor Stimulation Parameters: 2 Hz was used to evaluate the motor component. Care was taken not to lesion any nerves that demonstrated motor stimulation of the lower extremities at an output of less than 2.5 times that of the sensory threshold, or a maximum of 2.0 V. Lesioning Technique Parameters: Standard Radiofrequency settings. (Not bipolar or pulsed.) Temperature Settings: 80 degrees C Lesioning time: 60 seconds Intra-operative Compliance: Compliant Materials & Medications: Needle(s) (Electrode/Cannula) Type: Teflon-coated, curved tip, Radiofrequency needle(s) Gauge: 22G Length: 10cm Numbing solution: 10 cc solution made of 8 cc of 0.2% ropivacaine, 2 cc of Decadron 10 mg/cc.  2 cc injected at each level above on the left after sensorimotor testing, prior to ablation.  The unused portion of the solution was discarded in the proper designated containers.  Once the entire procedure was completed, the treated area was  cleaned, making sure to leave some of the prepping solution back to take advantage of its long term bactericidal properties.  Illustration of the posterior view of the lumbar spine and the posterior neural structures. Laminae of L2 through S1 are labeled. DPRL5, dorsal primary ramus of L5; DPRS1, dorsal primary ramus of S1; DPR3, dorsal primary ramus of L3; FJ, facet (zygapophyseal) joint L3-L4; I, inferior articular process of L4; LB1, lateral branch of dorsal primary ramus of L1; IAB, inferior articular branches from L3 medial branch (supplies L4-L5 facet joint); IBP, intermediate branch plexus; MB3, medial branch of dorsal primary ramus of L3; NR3, third lumbar nerve root; S, superior articular process of L5; SAB, superior articular branches from L4 (supplies L4-5 facet joint also); TP3, transverse process of L3.  Vitals:   12/14/18 0914 12/14/18 0924 12/14/18 0934 12/14/18 0945  BP: (!) 138/91 140/88 (!) 149/73 140/74  Pulse:      Resp: 20 17 18 18   Temp:  98.2 F (36.8 C)  98.1 F (36.7 C)  TempSrc:      SpO2: 94% 100% 96% 96%  Weight:      Height:        Start Time: 0843 hrs. End Time: 0914 hrs.  Imaging Guidance (Spinal):          Type of Imaging Technique: Fluoroscopy Guidance (Spinal) Indication(s): Assistance in needle guidance and placement for procedures requiring needle placement in or near specific anatomical locations not easily accessible without such assistance. Exposure Time: Please see nurses notes. Contrast: None used. Fluoroscopic Guidance: I was personally present during the use of fluoroscopy. "Tunnel Vision Technique" used to obtain the best possible view of the target area. Parallax error corrected before commencing the procedure. "Direction-depth-direction" technique used to introduce the needle under continuous pulsed fluoroscopy. Once target was reached, antero-posterior, oblique, and lateral fluoroscopic projection used confirm needle placement in all planes.  Images permanently stored in EMR. Interpretation: No contrast injected. I personally interpreted the imaging intraoperatively. Adequate  needle placement confirmed in multiple planes. Permanent images saved into the patient's record.  Antibiotic Prophylaxis:   Anti-infectives (From admission, onward)   None     Indication(s): None identified  Post-operative Assessment:  Post-procedure Vital Signs:  Pulse/HCG Rate: 7572 Temp: 98.1 F (36.7 C) Resp: 18 BP: 140/74 SpO2: 96 %  EBL: None  Complications: No immediate post-treatment complications observed by team, or reported by patient.  Note: The patient tolerated the entire procedure well. A repeat set of vitals were taken after the procedure and the patient was kept under observation following institutional policy, for this type of procedure. Post-procedural neurological assessment was performed, showing return to baseline, prior to discharge. The patient was provided with post-procedure discharge instructions, including a section on how to identify potential problems. Should any problems arise concerning this procedure, the patient was given instructions to immediately contact us, at any time, without hesitation. In any case, we plan to contact the patient by telephone for a follow-up status report regarding this interventional procedure.  Comments:  No additional relevant information.  Plan of Care  Orders:  Orders Placed This Encounter  Procedures   Fluoro (C-Arm) (<60 min) (No Report)    Intraoperative interpretation by procedural physician at [place].    Standing Status:   Standing    Number of Occurrences:   1    Order Specific Question:   Reason for exam:    Answer:   Assistance in needle guidance and placement for procedures requiring needle placement in or near specific anatomical locations not easily accessible without such assistance.   Medications ordered for procedure: Meds ordered this encounter    Medications   lidocaine (XYLOCAINE) 2 % (with pres) injection 400 mg   midazolam (VERSED) 5 MG/5ML injection 1-2 mg    Make sure Flumazenil is available in the pyxis when using this medication. If oversedation occurs, administer 0.2 mg IV over 15 sec. If after 45 sec no response, administer 0.2 mg again over 1 min; may repeat at 1 min intervals; not to exceed 4 doses (1 mg)   fentaNYL (SUBLIMAZE) injection 25-50 mcg    Make sure Narcan is available in the pyxis when using this medication. In the event of respiratory depression (RR< 8/min): Titrate NARCAN (naloxone) in increments of 0.1 to 0.2 mg IV at 2-3 minute intervals, until desired degree of reversal.   ropivacaine (PF) 2 mg/mL (0.2%) (NAROPIN) injection 9 mL   dexamethasone (DECADRON) injection 20 mg   dexamethasone (DECADRON) injection 10 mg   ropivacaine (PF) 2 mg/mL (0.2%) (NAROPIN) injection 2 mL   predniSONE (DELTASONE) 20 MG tablet    Sig: Take 3 tablets (60 mg total) by mouth daily with breakfast for 3 days, THEN 2 tablets (40 mg total) daily with breakfast for 3 days, THEN 1 tablet (20 mg total) daily with breakfast for 3 days.    Dispense:  18 tablet    Refill:  0   Medications administered: We administered lidocaine, midazolam, fentaNYL, ropivacaine (PF) 2 mg/mL (0.2%), dexamethasone, and dexamethasone.  See the medical record for exact dosing, route, and time of administration.  Follow-up plan:   Return in about 8 weeks (around 02/08/2019) for Post Procedure Evaluation, virtual.      s/p 2 diagnostic lumbar facet medial branch nerve blocks at bilateral L3, L4, L5, S1 on 04/06/2018 and 07/18/2018.  These procedures provided her with significant pain relief in regards to her axial low back and buttock pain; greater than 75% for approximately  4 weeks and ongoing from her most recent facet block.  lumbar facet medial branch RFA L3, L4, L5, S1 LEFT- November 30, 2018, RIGHT 12/14/2018.     Recent Visits Date Type Provider  Dept  11/30/18 Procedure visit Edward Jolly, MD [facility]  10/17/18 Office Visit Edward Jolly, MD [facility]  Showing recent visits within past 90 days and meeting all other requirements   Today's Visits Date Type Provider Dept  12/14/18 Procedure visit Edward Jolly, MD [facility]  Showing today's visits and meeting all other requirements   Future Appointments Date Type Provider Dept  02/08/19 Appointment Edward Jolly, MD [facility]  Showing future appointments within next 90 days and meeting all other requirements   Disposition: Discharge home  Discharge Date & Time: 12/14/2018; 0946 hrs.   Primary Care Physician: Corky Downs, MD Location: [facility] Note by: Edward Jolly, MD Date: 12/14/2018; Time: 10:20 AM  Disclaimer:  Medicine is not an exact science. The only guarantee in medicine is that nothing is guaranteed. It is important to note that the decision to proceed with this intervention was based on the information collected from the patient. The Data and conclusions were drawn from the patient's questionnaire, the interview, and the physical examination. Because the information was provided in large part by the patient, it cannot be guaranteed that it has not been purposely or unconsciously manipulated. Every effort has been made to obtain as much relevant data as possible for this evaluation. It is important to note that the conclusions that lead to this procedure are derived in large part from the available data. Always take into account that the treatment will also be dependent on availability of resources and existing treatment guidelines, considered by other Pain Management Practitioners as being common knowledge and practice, at the time of the intervention. For Medico-Legal purposes, it is also important to point out that variation in procedural techniques and pharmacological choices are the  acceptable norm. The indications, contraindications, technique, and results of the above procedure should only be interpreted and judged by a Board-Certified Interventional Pain Specialist with extensive familiarity and expertise in the same exact procedure and technique.

## 2018-12-19 ENCOUNTER — Telehealth: Payer: Self-pay

## 2018-12-19 NOTE — Telephone Encounter (Signed)
Called patient to get some information on need for talking with Dr Courtney Henderson. Patient had a Right Lumbar RF on 12/14/18 . She states that the correct procedure was performed r/t "popping noise" that was discussed. Courtney Henderson is still having pain in her lower back radiating to hips and back of legs to knees. Informed patient that when an RF is done, that it would take longer to feel the effect since MD was burning nerves.  Patient with understanding and states she is alternating heat and cold. She states that she is having to use a cane because she can not stand straight. Patient was given a steroid taper and states that helped her in combination of gabapentin and Muscle relaxer that her friend gave her. She stated that the med started with a "T".    Her question to you was that she would like to know if she could get a prescription for a muscle relaxer, and some more steroids. I informed her that it is not safe to keep taking steroids and it was also used during the procedure.  What is your thought? I will be glad to call her back and inform her.

## 2018-12-19 NOTE — Telephone Encounter (Signed)
Called patient back and instructed her on what Dr Holley Raring recommend. Patient with understanding. States she will call back if needed.

## 2018-12-19 NOTE — Telephone Encounter (Signed)
She wants Dr. Holley Raring to call her today. She didn't give me a reason, but she wants to speak to him.

## 2018-12-19 NOTE — Telephone Encounter (Signed)
Hold off on any additional steroids for the time being. She's had multiple rounds of steroid medications over the last couple of months. Recommend APAP 1000 mg BID prn.

## 2018-12-20 ENCOUNTER — Telehealth: Payer: Self-pay | Admitting: Student in an Organized Health Care Education/Training Program

## 2018-12-20 NOTE — Telephone Encounter (Signed)
Attempted to call patient back. Left message.

## 2018-12-20 NOTE — Telephone Encounter (Signed)
Patient states she is having excruiating pain since procedure. Tylenol is not helping, what else can she do, not able to walk without help, laying down does not help, can hardly sit to use the bathroom. Is there anything that will help. Just had RF on 25th

## 2018-12-21 MED ORDER — HYDROCODONE-ACETAMINOPHEN 5-325 MG PO TABS: 1.0000 | ORAL_TABLET | Freq: Four times a day (QID) | ORAL | 0 refills | Status: AC | PRN

## 2018-12-21 NOTE — Telephone Encounter (Signed)
Patient was given a prescription for prednisone on the day of her radiofrequency ablation.  She was told to utilize this if she had an increase in her pain, has she done so yet?  For the time being, I would increase her gabapentin to 600 mg 3 times a day and also add acetaminophen 1000 mg twice daily for the next 5 days to see if it helps out with her postprocedural pain.  Could also call in a short supply of hydrocodone for acute pain.

## 2018-12-21 NOTE — Telephone Encounter (Signed)
Patient informed that Hydrocodone has been sent to pharmacy and instructed on how to take Hydrocodone.

## 2018-12-21 NOTE — Telephone Encounter (Signed)
She did take the Prednisone, it helped for a short while, but pain is now worse. I told her to increase the Gabapentin and add the Tylenol. I told her to call back in a few days if no improvement, Hydrocodone could possibly be called in. She wants it called in now because of the upcoming weekend, and we will not be available.

## 2018-12-21 NOTE — Telephone Encounter (Signed)
The patient called back and said please call her on her cell phone. The number is [phone]

## 2018-12-21 NOTE — Telephone Encounter (Signed)
Having pain in lower back and sides and backs of both legs, and buttocks. Began the day before the last RFA on 12-14-18. She reached her arms up and felt a "pop" in the lower back.  Has worsened since RFA . States pain is severe.

## 2018-12-27 ENCOUNTER — Emergency Department: Payer: PRIVATE HEALTH INSURANCE

## 2018-12-27 ENCOUNTER — Other Ambulatory Visit: Payer: Self-pay

## 2018-12-27 ENCOUNTER — Encounter: Payer: Self-pay | Admitting: Intensive Care

## 2018-12-27 ENCOUNTER — Emergency Department
Admission: EM | Admit: 2018-12-27 | Discharge: 2018-12-27 | Disposition: A | Discharge status: Home / Self Care | Payer: PRIVATE HEALTH INSURANCE | Attending: Emergency Medicine | Admitting: Emergency Medicine

## 2018-12-27 DIAGNOSIS — X501XXA Overexertion from prolonged static or awkward postures, initial encounter: Secondary | ICD-10-CM | POA: Insufficient documentation

## 2018-12-27 DIAGNOSIS — Y999 Unspecified external cause status: Secondary | ICD-10-CM | POA: Diagnosis not present

## 2018-12-27 DIAGNOSIS — E119 Type 2 diabetes mellitus without complications: Secondary | ICD-10-CM | POA: Diagnosis not present

## 2018-12-27 DIAGNOSIS — S32050A Wedge compression fracture of fifth lumbar vertebra, initial encounter for closed fracture: Secondary | ICD-10-CM | POA: Diagnosis not present

## 2018-12-27 DIAGNOSIS — F17210 Nicotine dependence, cigarettes, uncomplicated: Secondary | ICD-10-CM | POA: Insufficient documentation

## 2018-12-27 DIAGNOSIS — Z791 Long term (current) use of non-steroidal anti-inflammatories (NSAID): Secondary | ICD-10-CM | POA: Diagnosis not present

## 2018-12-27 DIAGNOSIS — Y929 Unspecified place or not applicable: Secondary | ICD-10-CM | POA: Insufficient documentation

## 2018-12-27 DIAGNOSIS — Z7984 Long term (current) use of oral hypoglycemic drugs: Secondary | ICD-10-CM | POA: Insufficient documentation

## 2018-12-27 DIAGNOSIS — S3992XA Unspecified injury of lower back, initial encounter: Secondary | ICD-10-CM | POA: Diagnosis present

## 2018-12-27 DIAGNOSIS — Y9389 Activity, other specified: Secondary | ICD-10-CM | POA: Diagnosis not present

## 2018-12-27 DIAGNOSIS — Z79899 Other long term (current) drug therapy: Secondary | ICD-10-CM | POA: Insufficient documentation

## 2018-12-27 HISTORY — DX: Lymphedema, not elsewhere classified: I89.0

## 2018-12-27 LAB — URINALYSIS, COMPLETE (UACMP) WITH MICROSCOPIC
Bacteria, UA: NONE SEEN
Bilirubin Urine: NEGATIVE
Glucose, UA: NEGATIVE mg/dL
Ketones, ur: NEGATIVE mg/dL
Nitrite: NEGATIVE
Protein, ur: NEGATIVE mg/dL
Specific Gravity, Urine: 1.016 (ref 1.005–1.030)
pH: 5 (ref 5.0–8.0)

## 2018-12-27 IMAGING — CT CT L SPINE W/O CM
3 series · 12 of 33 positions shown, 14 images · non-contrast
Comparison: CT lumbar spine [DATE]

CLINICAL DATA: Low back pain since [DATE].

EXAM:
CT LUMBAR SPINE WITHOUT CONTRAST
TECHNIQUE: Multidetector CT imaging of the lumbar spine was performed without
intravenous contrast administration. Multiplanar CT image
reconstructions were also generated.

[Series 4: l spine soft · axial · 0.30mm/px · z∈[+494,+640]mm · 4 of 105 slices shown, 5 images]
[im 17/105  soft-tissue]
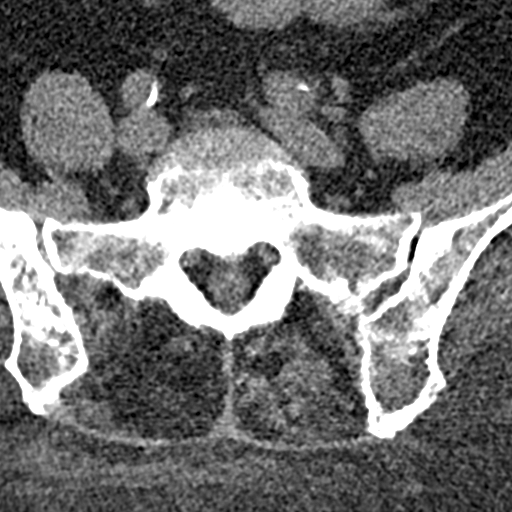
[im 17/105  bone]
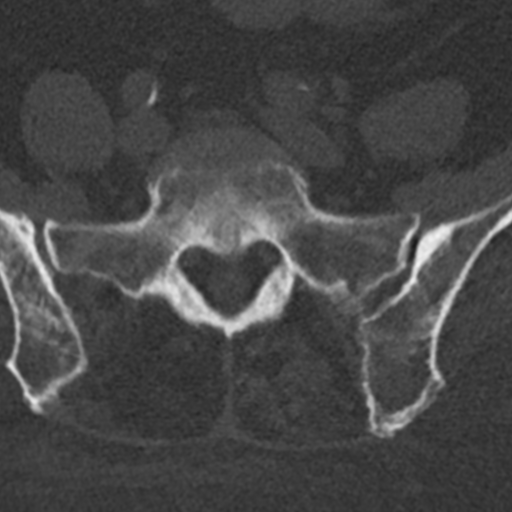
[im 41/105  bone]
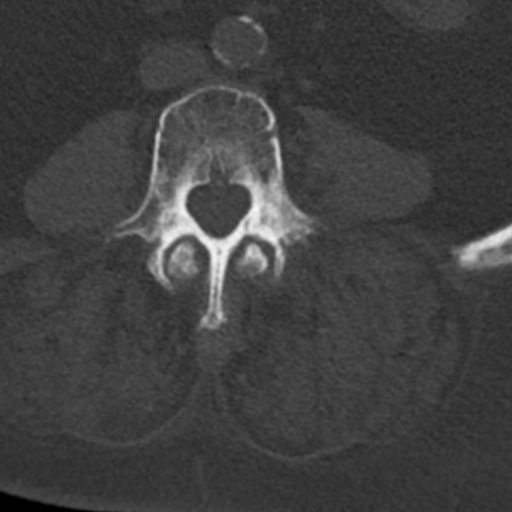
[im 65/105  bone]
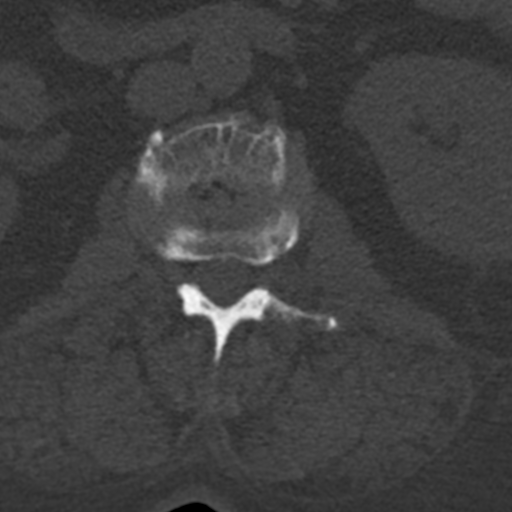
[im 89/105  bone]
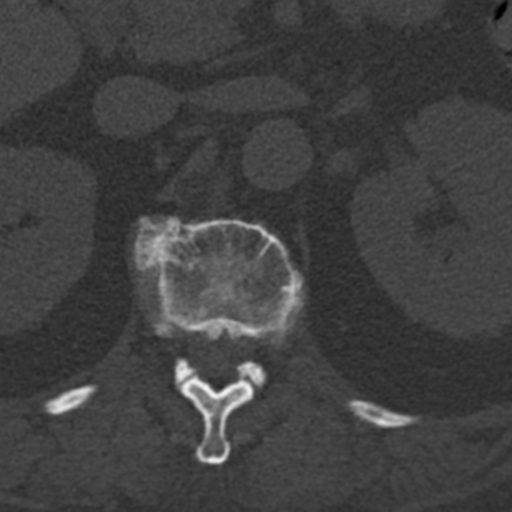

[Series 7: coronal bone · coronal · 0.27mm/px · 3 of 80 slices shown]
[im 16/80  bone]
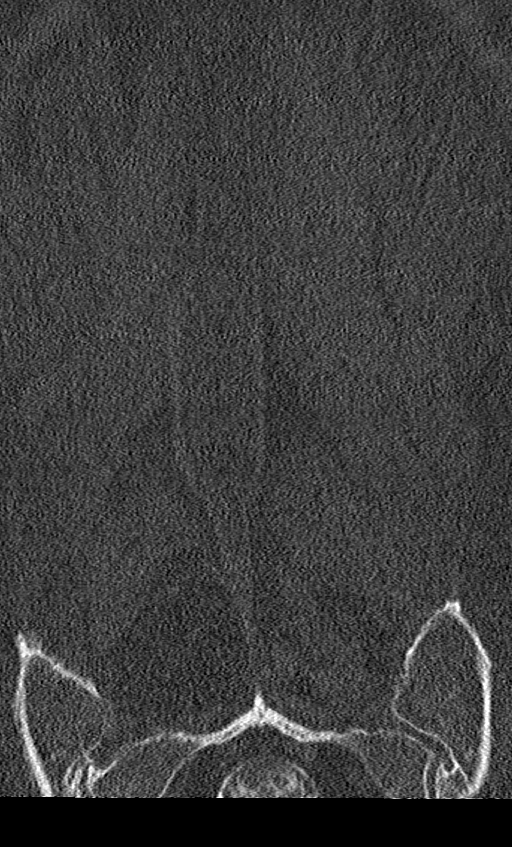
[im 32/80  bone]
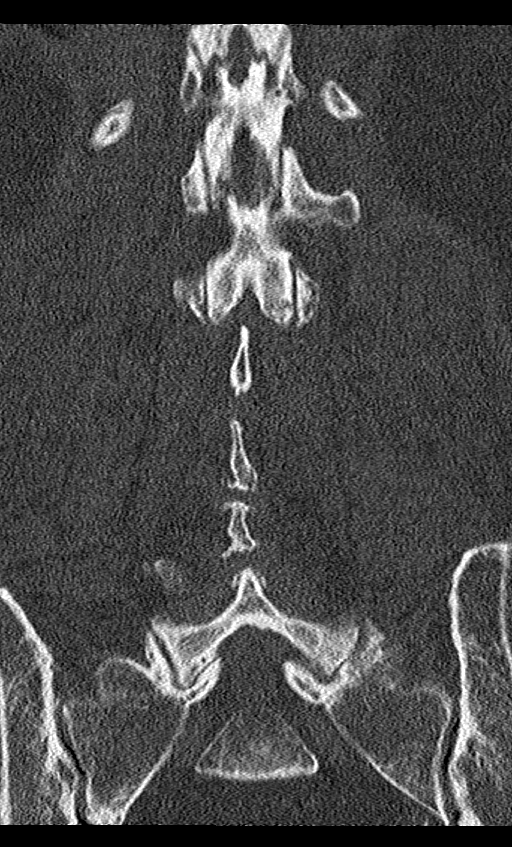
[im 48/80  bone]
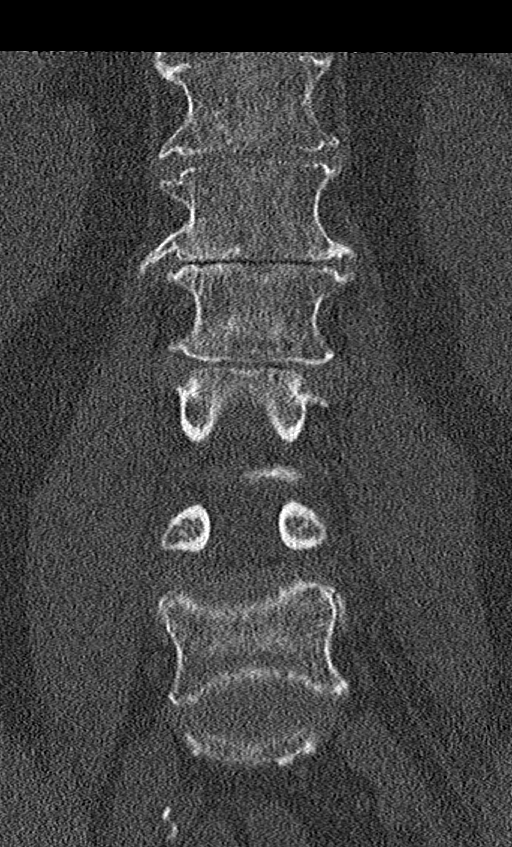

[Series 8: sagittal bone · sagittal · 0.31mm/px · 5 of 70 slices shown, 6 images]
[im 24/70  bone]
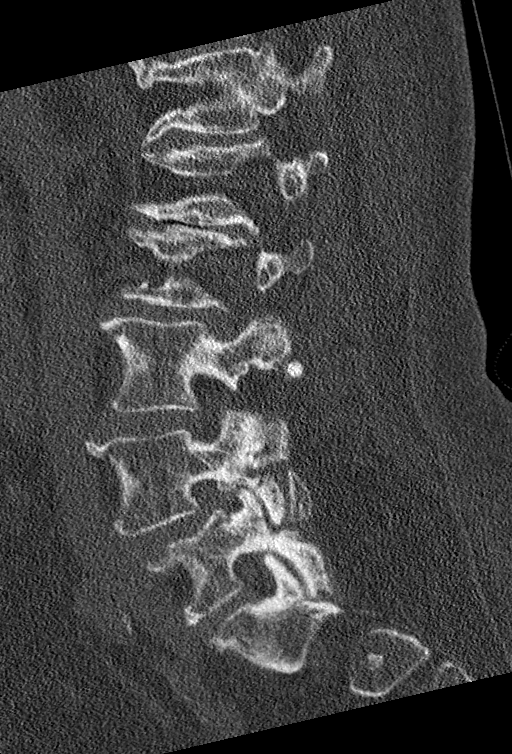
[im 29/70  bone]
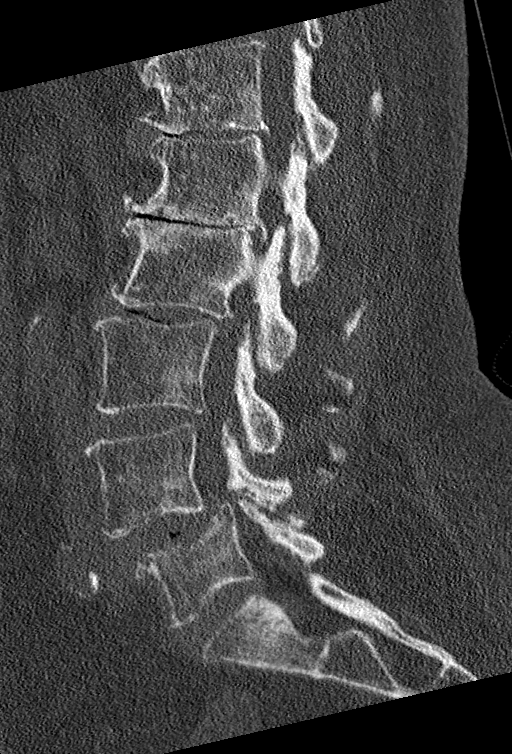
[im 35/70  soft-tissue]
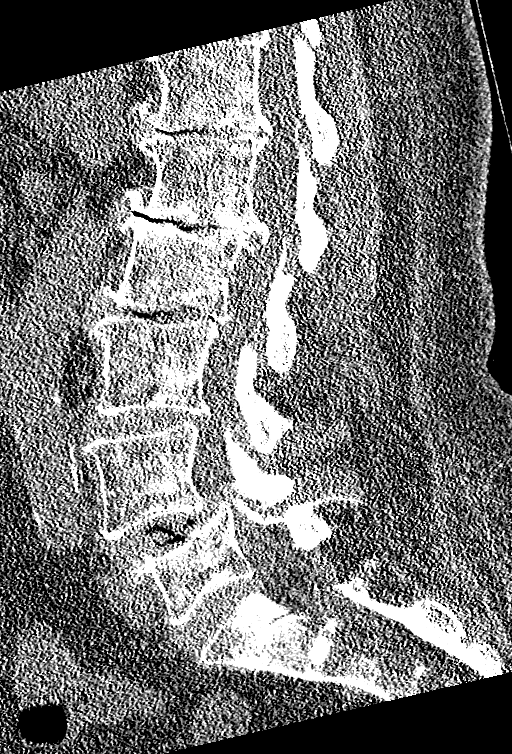
[im 35/70  bone]
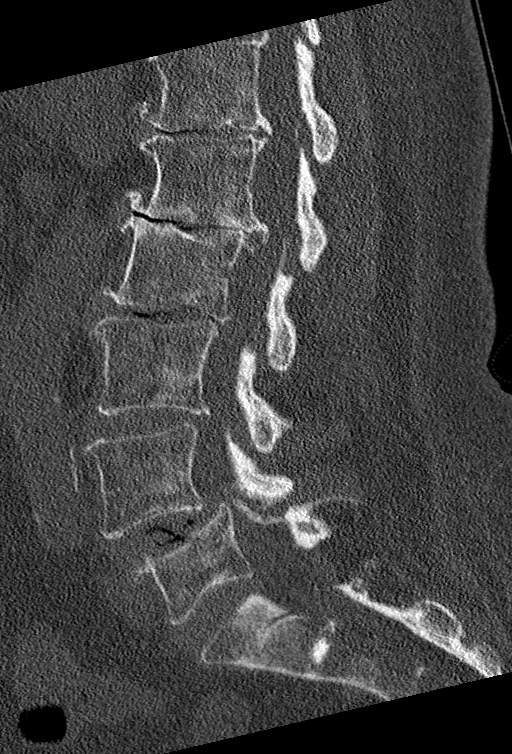
[im 41/70  bone]
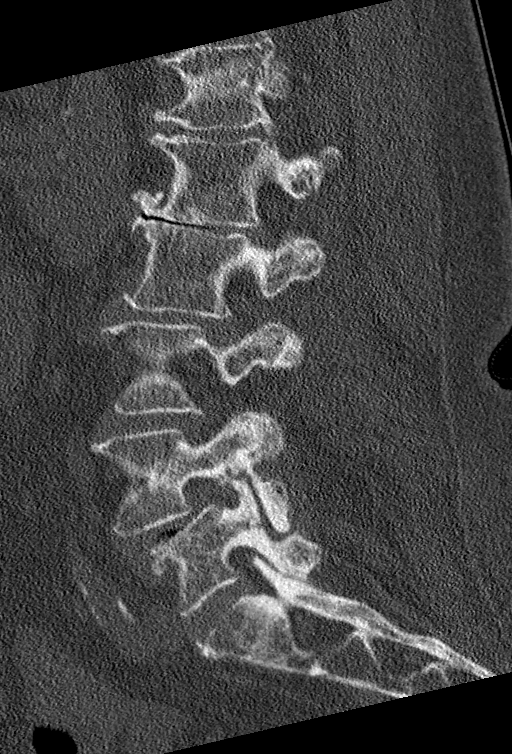
[im 47/70  bone]
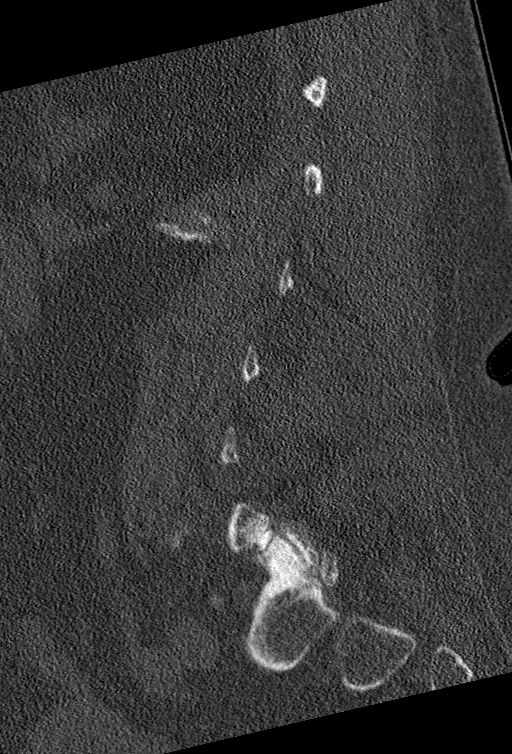

[12 of 33 positions shown; findings below may reference images not displayed]

FINDINGS: Segmentation: Normal

Alignment: 5 mm retrolisthesis L2-3. 4 mm retrolisthesis L3-4. 9 mm
anterolisthesis L4-5

Vertebrae: Superior endplate compression fracture of L5. This was
not present previously and appears to be a recent fracture. No
underlying mass lesion is identified. No fracture of the posterior
elements.

Paraspinal and other soft tissues: Negative for paraspinous mass or
adenopathy. Mild atherosclerotic disease

Disc levels: T12-L1: Advanced disc degeneration and spurring causing
mild spinal stenosis. Mild facet degeneration.

L1-2: Advanced disc degeneration and prominent posterior osteophyte
formation. Moderate to severe spinal stenosis. Bilateral facet
degeneration.

L2-3: 5 mm retrolisthesis. Disc degeneration with disc bulging and
endplate spurring. Moderate spinal stenosis.

L3-4: 4 mm retrolisthesis. Disc degeneration with disc bulging and
endplate spurring. Mild facet degeneration. Moderate spinal stenosis

L4-5: Severe spinal stenosis with severe subarticular stenosis
bilaterally. Severe facet degeneration as well as diffuse bulging of
the disc. Fracture of the superior endplate of L5 as described above

L5-S1: Moderate facet degeneration bilaterally. No significant
stenosis.
IMPRESSION: 1. Mild superior endplate compression fracture of L5 which appears
to be a recent fracture. No underlying mass lesion or fracture of
the posterior elements.
2. Advanced lumbar degenerative change with multilevel spinal
stenosis. Moderate to severe spinal stenosis at L1-2 and L2-3.
3. Severe spinal stenosis L4-5 with severe subarticular stenosis
bilaterally.

## 2018-12-27 MED ORDER — LIDOCAINE 5 % EX PTCH: 1.0000 | MEDICATED_PATCH | Freq: Two times a day (BID) | CUTANEOUS | 0 refills | Status: DC

## 2018-12-27 MED ORDER — LIDOCAINE 5 % EX PTCH
1.0000 | MEDICATED_PATCH | CUTANEOUS | Status: DC
  Administered 2018-12-27: 1 via TRANSDERMAL
  Filled 2018-12-27: qty 1

## 2018-12-27 MED ORDER — OXYCODONE HCL 5 MG PO CAPS: 5.0000 mg | ORAL_CAPSULE | Freq: Four times a day (QID) | ORAL | 0 refills | Status: DC | PRN

## 2018-12-27 NOTE — ED Notes (Signed)
See triage note. Presents with lower back pain  States pain is in lower back pain and radiates into buttocks   States pain is now moving further into her legs  States she is not able to walk w/o a walker  Having increased pain

## 2018-12-27 NOTE — ED Provider Notes (Signed)
[facility] Emergency Department Provider Note   ____________________________________________   First MD Initiated Contact with Patient 12/27/18 1000     (approximate)  I have reviewed the triage vital signs and the nursing notes.   HISTORY  Chief Complaint Back Pain    HPI Courtney Henderson is a 58 y.o. female patient presents with radicular low back pain to bilateral lower extremities for 2 weeks.  Patient state last month she felt a pop in her back when she was reaching to her upper shelf in a closet.  Patient states [facility] and they told her to come to the emergency room.  Patient denies bladder bowel dysfunction.  Patient has history of degenerative disc disease and bulging disks in the lumbar spine.  Patient is under pain management and receives fluoroscopy (C arm) injections.  Patient rates the pain as 8/10.  Patient described the pain is "achy".         Past Medical History:  Diagnosis Date  . Diabetes mellitus without complication ([place])   . Lymphedema     Patient Active Problem List   Diagnosis Date Noted  . Lumbar facet arthropathy 03/17/2018  . Lumbar spondylosis 03/17/2018  . Bilateral lower extremity edema 03/09/2018  . Lumbar radiculopathy 11/09/2017  . Chronic pain syndrome 11/09/2017  . Lumbar foraminal stenosis 11/09/2017  . Lumbosacral spondylosis with radiculopathy 07/13/2017  . Polyarthralgia 07/13/2017  . Diabetic polyneuropathy associated with type 2 diabetes mellitus ([place]) 07/13/2017    Past Surgical History:  Procedure Laterality Date  . MULTIPLE TOOTH EXTRACTIONS  06/2018   2-3 weeks ago; 10 teeth    Prior to Admission medications   Medication Sig Start Date End Date Taking? Authorizing Provider  Ascorbic Acid (VITAMIN C) 100 MG tablet Take by mouth.    [provider]  Blood Glucose Monitoring Suppl (CONTOUR NEXT EZ MONITOR) w/Device KIT CHECK BLOOD SUGAR TWICE A DAY 04/29/16    [provider]  BREO ELLIPTA 100-25 MCG/INH AEPB INHALE 1 PUFF BY MOUTH DAILY 03/17/16   [provider]  furosemide (LASIX) 20 MG tablet Take 20 mg by mouth daily. Patient only takes this when she isnt leaving her house. 01/20/16   [provider]  gabapentin (NEURONTIN) 300 MG capsule Take 2 capsules (600 mg total) by mouth 2 (two) times daily. 10/17/18   Gillis Santa, MD  glipiZIDE (GLUCOTROL) 5 MG tablet Take 5 mg by mouth daily. 03/18/18   [provider]  glucose blood (BAYER CONTOUR NEXT TEST) test strip CHECK BLOOD SUGAR TWICE A DAY 04/29/16   [provider]  HYDROcodone-acetaminophen (NORCO/VICODIN) 5-325 MG tablet Take 1 tablet by mouth every 6 (six) hours as needed for up to 7 days for severe pain. Must last 7 days. 12/21/18 12/28/18  Gillis Santa, MD  ibuprofen (ADVIL,MOTRIN) 200 MG tablet Take 200 mg by mouth 6 (six) times daily.    [provider]  KLOR-CON 10 10 MEQ tablet Take 10 mEq by mouth daily. 01/20/16   [provider]  latanoprost (XALATAN) 0.005 % ophthalmic solution Place 1 drop into both eyes at bedtime. 03/18/18   [provider]  lidocaine (LIDODERM) 5 % Place 1 patch onto the skin every 12 (twelve) hours. Remove & Discard patch within 12 hours or as directed by MD 12/27/18 12/27/19  Sable Feil, PA-C  magnesium 30 MG tablet Take 30 mg by mouth daily.    [provider]  meloxicam (MOBIC) 7.5 MG  tablet  03/29/18   [provider]  metFORMIN (GLUCOPHAGE) 500 MG tablet Take 500 mg by mouth 2 (two) times daily. 06/04/14   [provider]  MICROLET LANCETS MISC CHECK BLOOD SUGAR TWICE A DAY 04/29/16   [provider]  mometasone (NASONEX) 50 MCG/ACT nasal spray as needed.  01/21/16   [provider]  oxycodone (OXY-IR) 5 MG capsule Take 1 capsule (5 mg total) by mouth every 6 (six) hours as needed for up to 5 days. 12/27/18 01/01/19  Sable Feil, PA-C  PROLENSA 0.07  % SOLN INSTILL 1 DROP IN LEFT EYE AT BEDTIME 06/17/16   [provider]  timolol (TIMOPTIC) 0.5 % ophthalmic solution  06/29/14   [provider]    Allergies Morphine and related, Penicillins, and Amoxicillin  History reviewed. No pertinent family history.  Social History Social History   Tobacco Use  . Smoking status: Current Every Day Smoker    Types: Cigarettes  . Smokeless tobacco: Never Used  Substance Use Topics  . Alcohol use: Not Currently    Alcohol/week: 0.0 standard drinks  . Drug use: Never    Review of Systems Constitutional: No fever/chills Eyes: No visual changes. ENT: No sore throat. Cardiovascular: Denies chest pain. Respiratory: Denies shortness of breath. Gastrointestinal: No abdominal pain.  No nausea, no vomiting.  No diarrhea.  No constipation. Genitourinary: Negative for dysuria. Musculoskeletal: Positive for chronic back pain. Skin: Negative for rash. Neurological: Negative for headaches, focal weakness or numbness. Endocrine:  Diabetes Allergic/Immunilogical: Morphine and penicillin  ____________________________________________   PHYSICAL EXAM:  VITAL SIGNS: ED Triage Vitals  Enc Vitals Group     BP 12/27/18 0938 (!) 145/73     Pulse Rate 12/27/18 0938 78     Resp 12/27/18 0938 16     Temp 12/27/18 0938 99.5 F (37.5 C)     Temp Source 12/27/18 0938 Oral     SpO2 12/27/18 0938 99 %     Weight 12/27/18 0936 222 lb (100.7 kg)     Height 12/27/18 0936 5' 4"  (1.626 m)     Head Circumference --      Peak Flow --      Pain Score 12/27/18 0936 8     Pain Loc --      Pain Edu? --      Excl. in [place]? --     Constitutional: Alert and oriented. Well appearing and in no acute distress. Cardiovascular: Normal rate, regular rhythm. Grossly normal heart sounds.  Good peripheral circulation. Respiratory: Normal respiratory effort.  No retractions. Lungs CTAB. Musculoskeletal: No obvious spinal deformity.  Patient has moderate  guarding palpation spinal ( 3 through S1.  Patient decreased range of motion with flexion and full extension.  Patient positive straight leg test on the left but not on the right.   Neurologic:  Normal speech and language. No gross focal neurologic deficits are appreciated. No gait instability. Skin:  Skin is warm, dry and intact. No rash noted. Psychiatric: Mood and affect are normal. Speech and behavior are normal.  ____________________________________________   LABS (all labs ordered are listed, but only abnormal results are displayed)  Labs Reviewed  URINALYSIS, COMPLETE (UACMP) WITH MICROSCOPIC - Abnormal; Notable for the following components:      Result Value   Color, Urine YELLOW (*)    APPearance CLEAR (*)    Hgb urine dipstick SMALL (*)    Leukocytes,Ua SMALL (*)    All other components within normal limits  ____________________________________________  EKG   ____________________________________________  RADIOLOGY  ED MD interpretation:    Official radiology report(s): Ct Lumbar Spine Wo Contrast  Result Date: 12/27/2018 CLINICAL DATA:  Low back pain since 12/18/2018. EXAM: CT LUMBAR SPINE WITHOUT CONTRAST TECHNIQUE: Multidetector CT imaging of the lumbar spine was performed without intravenous contrast administration. Multiplanar CT image reconstructions were also generated. COMPARISON:  CT lumbar spine 06/18/2017 FINDINGS: Segmentation: Normal Alignment: 5 mm retrolisthesis L2-3. 4 mm retrolisthesis L3-4. 9 mm anterolisthesis L4-5 Vertebrae: Superior endplate compression fracture of L5. This was not present previously and appears to be a recent fracture. No underlying mass lesion is identified. No fracture of the posterior elements. Paraspinal and other soft tissues: Negative for paraspinous mass or adenopathy. Mild atherosclerotic disease Disc levels: T12-L1: Advanced disc degeneration and spurring causing mild spinal stenosis. Mild facet degeneration. L1-2: Advanced  disc degeneration and prominent posterior osteophyte formation. Moderate to severe spinal stenosis. Bilateral facet degeneration. L2-3: 5 mm retrolisthesis. Disc degeneration with disc bulging and endplate spurring. Moderate spinal stenosis. L3-4: 4 mm retrolisthesis. Disc degeneration with disc bulging and endplate spurring. Mild facet degeneration. Moderate spinal stenosis L4-5: Severe spinal stenosis with severe subarticular stenosis bilaterally. Severe facet degeneration as well as diffuse bulging of the disc. Fracture of the superior endplate of L5 as described above L5-S1: Moderate facet degeneration bilaterally. No significant stenosis. IMPRESSION: 1. Mild superior endplate compression fracture of L5 which appears to be a recent fracture. No underlying mass lesion or fracture of the posterior elements. 2. Advanced lumbar degenerative change with multilevel spinal stenosis. Moderate to severe spinal stenosis at L1-2 and L2-3. 3. Severe spinal stenosis L4-5 with severe subarticular stenosis bilaterally. Electronically Signed   By: Franchot Gallo M.D.   On: 12/27/2018 10:56    ____________________________________________   PROCEDURES  Procedure(s) performed (including Critical Care):  Procedures   ____________________________________________   INITIAL IMPRESSION / ASSESSMENT AND PLAN / ED COURSE  As part of my medical decision making, I reviewed the following data within the [place]     Patient presents with 2 weeks of increasing back pain.  Discussed CT findings with patient revealing a recent compression fracture L5.  Patient given discharge care instruction advised take medication as directed.  Patient advised follow-up pain management for continued care.    Courtney Henderson was evaluated in Emergency Department on 12/27/2018 for the symptoms described in the history of present illness. She was evaluated in the context of the global COVID-19 pandemic, which necessitated  consideration that the patient might be at risk for infection with the SARS-CoV-2 virus that causes COVID-19. Institutional protocols and algorithms that pertain to the evaluation of patients at risk for COVID-19 are in a state of rapid change based on information released by regulatory bodies including the CDC and federal and state organizations. These policies and algorithms were followed during the patient's care in the ED.       ____________________________________________   FINAL CLINICAL IMPRESSION(S) / ED DIAGNOSES  Final diagnoses:  Compression fracture of L5 lumbar vertebra, closed, initial encounter [facility])     ED Discharge Orders         Ordered    oxycodone (OXY-IR) 5 MG capsule  Every 6 hours PRN     12/27/18 1119    lidocaine (LIDODERM) 5 %  Every 12 hours     12/27/18 1119           Note:  This document was prepared using Dragon voice recognition software  and may include unintentional dictation errors.    Sable Feil, PA-C 12/27/18 1122    Lavonia Drafts, MD 12/27/18 1224

## 2018-12-27 NOTE — Discharge Instructions (Addendum)
Follow discharge care instruction and contact pain management with few new CT findings.  Be advised medication may cause drowsiness.

## 2018-12-27 NOTE — ED Triage Notes (Signed)
Presents with lower back pain that radiates down bilateral legs X2 weeks. Heard a pop in her lower back last month when she was reaching for something. Patient comes from [facility]. Uses walker at home. Drove herself to ER.

## 2018-12-28 ENCOUNTER — Telehealth: Payer: Self-pay | Admitting: Student in an Organized Health Care Education/Training Program

## 2018-12-28 NOTE — Telephone Encounter (Signed)
Spoke to patient regarding CT findings. CT shows L5 compression fracture. Recommend patient take Naproxen 440 mg BID, APAP 500 mg TID-QID prn. Appt with NSG on Friday to discuss utility of vertebral augmentation: vertebroplasty vs kyphoplasty.

## 2018-12-28 NOTE — Telephone Encounter (Signed)
Patient had her CT done 12-27-18, was told she needs to contact Dr. Holley Raring and a neurosurgeon, asap, [phone] I looked for an appt but there are none available until Jan. She would like to speak with Dr. Holley Raring.

## 2018-12-28 NOTE — Telephone Encounter (Signed)
Dr Holley Raring, please review patients CT and advise. Thank you

## 2018-12-30 ENCOUNTER — Other Ambulatory Visit: Payer: Self-pay

## 2018-12-30 ENCOUNTER — Other Ambulatory Visit: Payer: Self-pay | Admitting: Student

## 2018-12-30 ENCOUNTER — Ambulatory Visit
Admission: RE | Admit: 2018-12-30 | Discharge: 2018-12-30 | Disposition: A | Discharge status: Home / Self Care | Payer: PRIVATE HEALTH INSURANCE | Source: Ambulatory Visit | Attending: Student | Admitting: Student

## 2018-12-30 DIAGNOSIS — S32059A Unspecified fracture of fifth lumbar vertebra, initial encounter for closed fracture: Secondary | ICD-10-CM | POA: Insufficient documentation

## 2018-12-30 IMAGING — MR MR LUMBAR SPINE W/O CM
4 of 5 series · 30 of 48 positions shown · non-contrast
Comparison: CT [DATE]

CLINICAL DATA: Compression fracture, bilateral buttocks and hip
pain

EXAM:
MRI LUMBAR SPINE WITHOUT CONTRAST
TECHNIQUE: Multiplanar, multisequence MR imaging of the lumbar spine was
performed. No intravenous contrast was administered.

[Series 5: T2 · sagittal · 4.0mm · 0.81mm/px · 6 of 15 slices shown (1 of 2)]
[im 1/15]
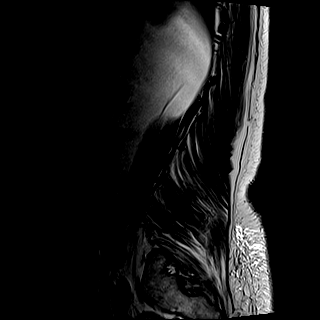
[im 3/15]
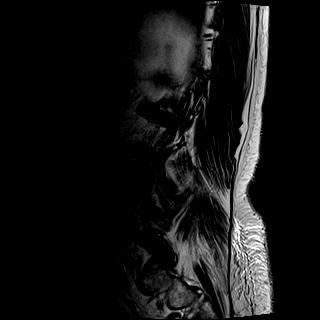
[im 6/15]
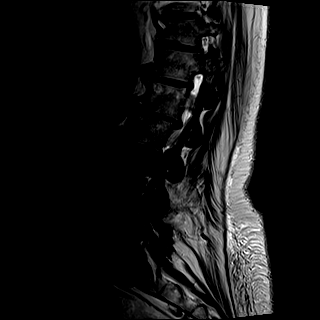
[im 9/15]
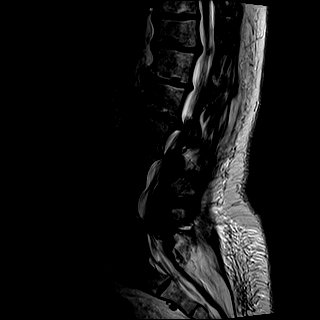
[im 12/15]
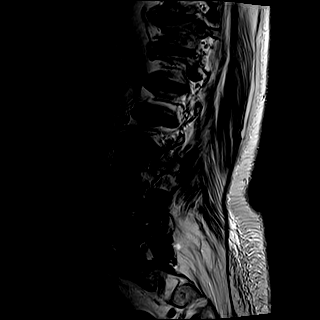
[im 15/15]
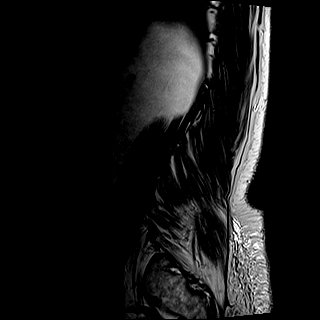

[Series 6: T1 · sagittal · 4.0mm · 0.81mm/px · 6 of 15 slices shown (1 of 2)]
[im 1/15]
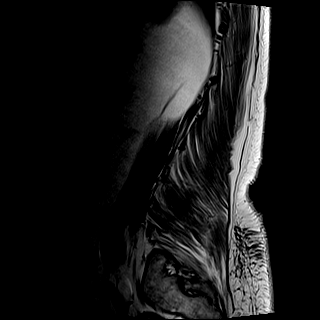
[im 3/15]
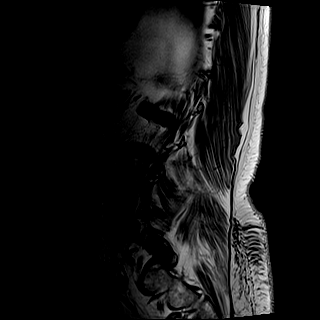
[im 6/15]
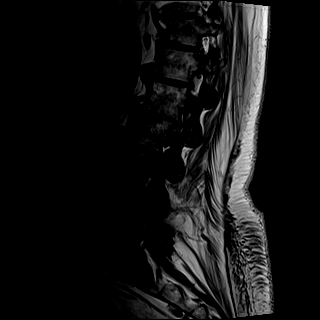
[im 9/15]
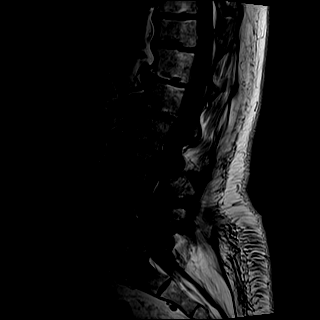
[im 12/15]
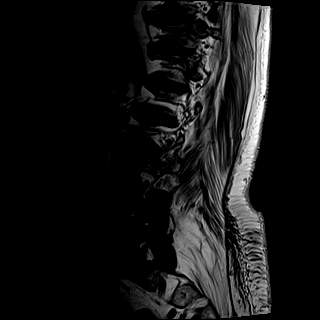
[im 15/15]
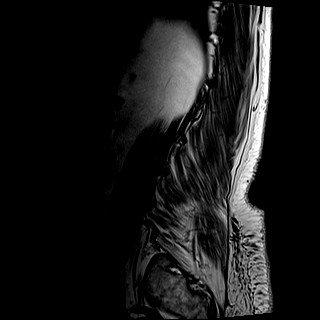

[Series 8: T2 · axial · 4.0mm · 0.78mm/px · z∈[-75,+146]mm · 9 of 36 slices shown (2 of 2)]
[im 1/36]
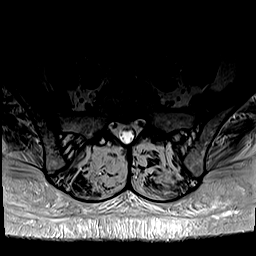
[im 6/36]
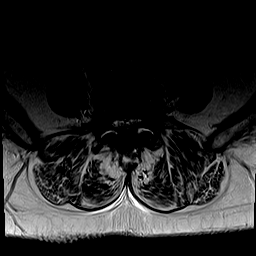
[im 11/36]
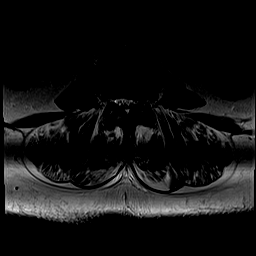
[im 16/36]
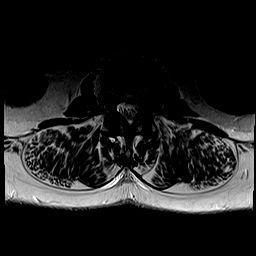
[im 18/36]
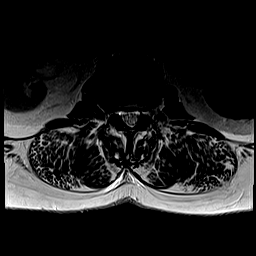
[im 21/36]
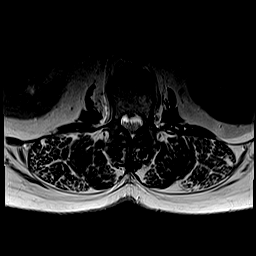
[im 26/36]
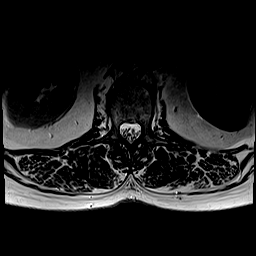
[im 31/36]
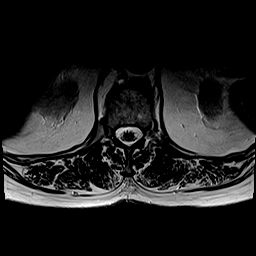
[im 36/36]
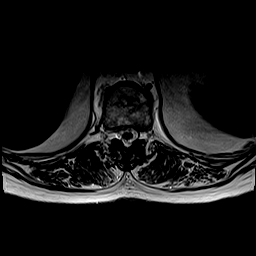

[Series 9: T1 · axial · 4.0mm · 0.39mm/px · z∈[-75,+146]mm · 9 of 36 slices shown (2 of 2)]
[im 1/36]
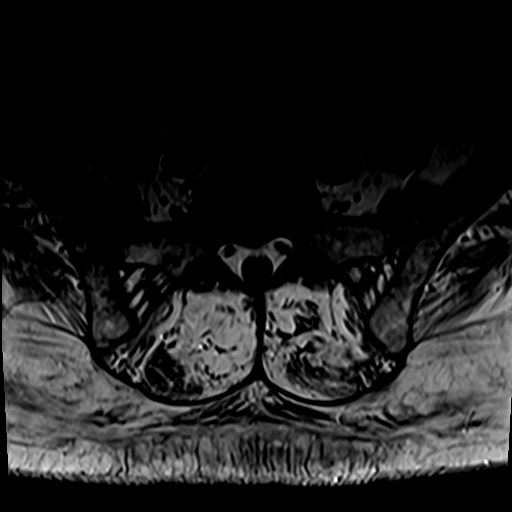
[im 6/36]
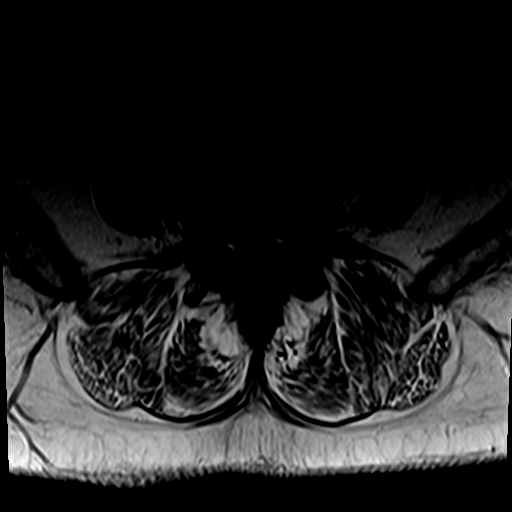
[im 11/36]
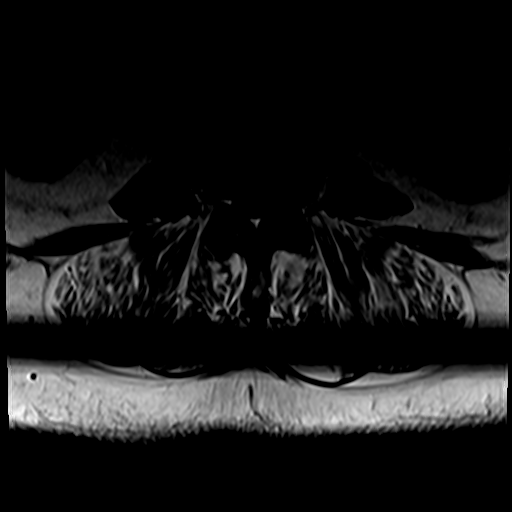
[im 16/36]
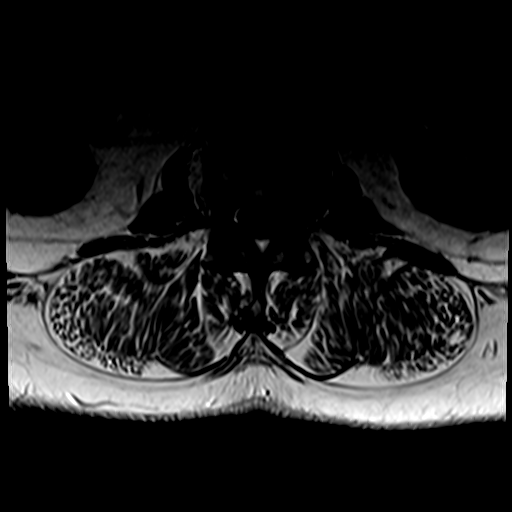
[im 18/36]
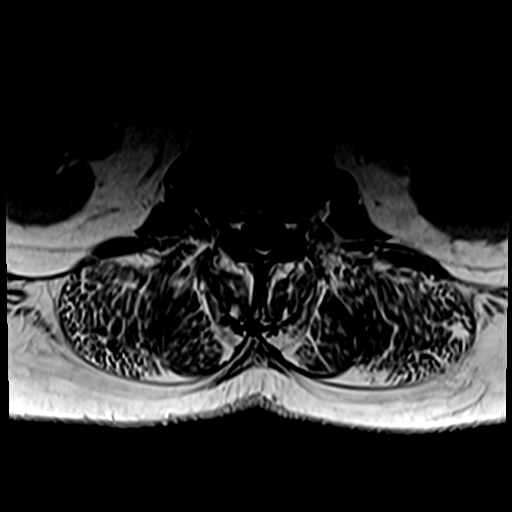
[im 21/36]
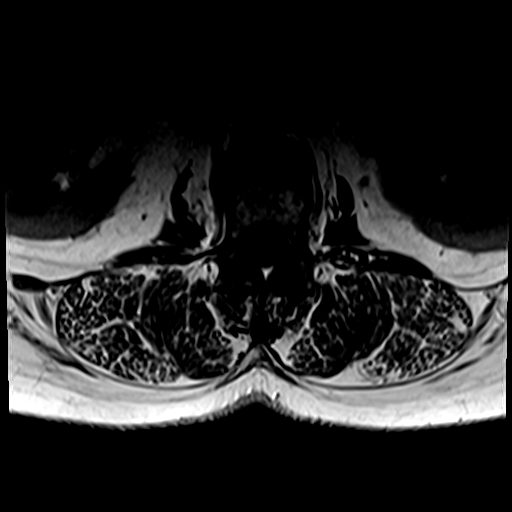
[im 26/36]
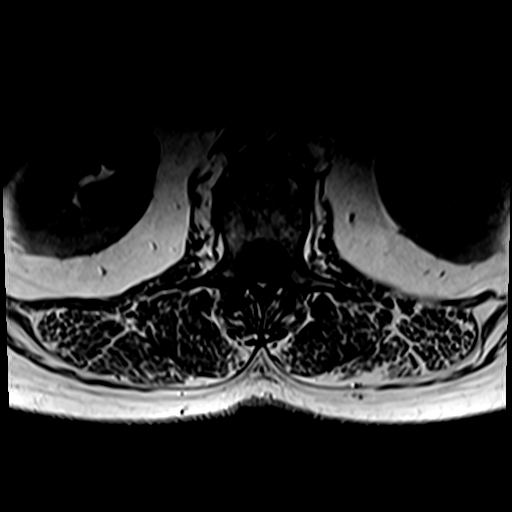
[im 31/36]
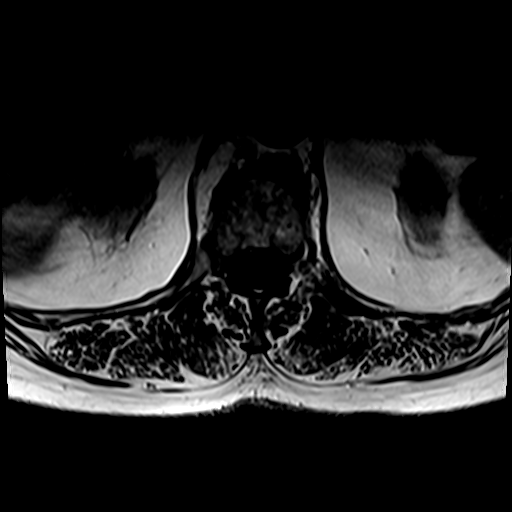
[im 36/36]
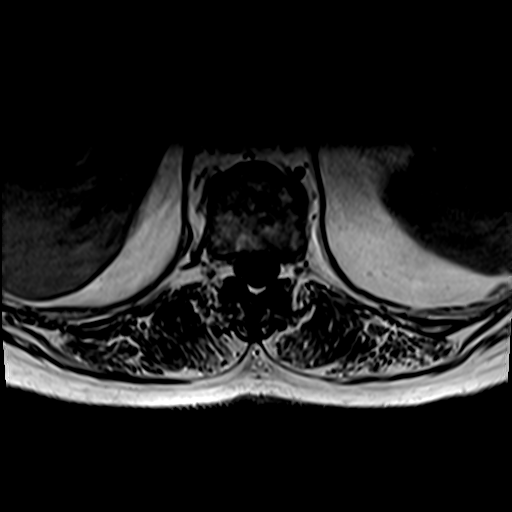

[30 of 48 positions shown; findings below may reference images not displayed]

FINDINGS: Segmentation: There are 5 non-rib bearing lumbar type vertebral
bodies with the last intervertebral disc space labeled as L5-S1.

Alignment: There is a grade 1 anterolisthesis of L4 on L5 measuring
5 mm which dates back to [DATE]. A minimal retrolisthesis of
L2 on L3 and L3 on L4.

Vertebrae: There is a subacute superior compression fracture of the
L5 vertebral body with approximately 25% loss in height. There is
fluid seen within the inter disc space and between the facet joints
at L4-L5. There is buckling of the posterosuperior cortex of L5. No
retropulsion of fragments is seen. There is fatty endplate changes
seen at L1-L2. Disc height is noted throughout the lumbar spine with
small anterior osteophytes.

Conus medullaris and cauda equina: Conus extends to the L1 level.
Conus and cauda equina appear normal.

Paraspinal and other soft tissues: There is edema/fluid seen between
the inter spinous ligaments at L3-L4 and L4-L5 likely due to
ligamentous sprain. The sacroiliac joints are intact.

Disc levels:

T12-L1: There is a broad-based disc bulge with facet arthrosis which
causes mild bilateral neural foraminal narrowing.

L1-L2: There is a broad-based disc bulge which is asymmetric to the
right with moderate bilateral neural foraminal narrowing. There is
mild central canal narrowing

L2-L3: There is a broad-based disc bulge with facet arthrosis and
ligamentum flavum hypertrophy which causes severe bilateral neural
foraminal narrowing and mild central canal stenosis.

L3-L4: There is a broad-based disc bulge with facet arthrosis and
ligamentum flavum hypertrophy which causes severe left and moderate
to severe right neural foraminal narrowing. The central thecal sac
measures 6 mm in AP diameter.

L4-L5: There is buckling of the posterosuperior cortex of L5 with
disc uncovering and a broad-based disc bulge. There is facet
arthrosis and ligamentum flavum hypertrophy. Severe bilateral neural
foraminal narrowing is seen. The central thecal sac is effaced
measuring 3 mm in AP diameter. There is redundancy of the nerve
roots seen at this level.

L5-S1:   No significant canal or neural foraminal narrowing.
IMPRESSION: 1. Subacute compression fracture of the L5 vertebral body with
approximately 25% loss in vertebral body height and buckling of the
posterosuperior cortex. No retropulsion of fragments is seen.
2. Findings suggestive of ligamentous sprain of the intraspinous
ligaments of L3-L4 and L4-L5.
3. Grade 1 anterolisthesis of L4 on L5 and a minimal retrolisthesis
of L2 on L3 and L3 on L4. This is unchanged from prior exam dating
back to [6H].
4. Lumbar spine spondylosis most notable at L4-L5 with severe
bilateral neural foraminal narrowing and severe central canal
stenosis.

These results will be called to the ordering clinician or
representative by the Radiologist Assistant, and communication
documented in the PACS or zVision Dashboard.

## 2019-01-03 ENCOUNTER — Other Ambulatory Visit
Admission: RE | Admit: 2019-01-03 | Discharge: 2019-01-03 | Disposition: A | Discharge status: Home / Self Care | Payer: PRIVATE HEALTH INSURANCE | Source: Ambulatory Visit | Attending: Orthopedic Surgery | Admitting: Orthopedic Surgery

## 2019-01-03 ENCOUNTER — Other Ambulatory Visit: Payer: Self-pay | Admitting: Orthopedic Surgery

## 2019-01-03 DIAGNOSIS — Z01812 Encounter for preprocedural laboratory examination: Secondary | ICD-10-CM | POA: Insufficient documentation

## 2019-01-03 DIAGNOSIS — Z20828 Contact with and (suspected) exposure to other viral communicable diseases: Secondary | ICD-10-CM | POA: Diagnosis not present

## 2019-01-03 LAB — SARS CORONAVIRUS 2 (TAT 6-24 HRS): SARS Coronavirus 2: NEGATIVE

## 2019-01-04 ENCOUNTER — Other Ambulatory Visit: Payer: Self-pay

## 2019-01-04 ENCOUNTER — Encounter
Admission: RE | Admit: 2019-01-04 | Discharge: 2019-01-04 | Disposition: A | Discharge status: Home / Self Care | Payer: PRIVATE HEALTH INSURANCE | Source: Ambulatory Visit | Attending: Orthopedic Surgery | Admitting: Orthopedic Surgery

## 2019-01-04 HISTORY — DX: Other complications of anesthesia, initial encounter: T88.59XA

## 2019-01-04 HISTORY — DX: Myoneural disorder, unspecified: G70.9

## 2019-01-04 MED ORDER — CLINDAMYCIN PHOSPHATE 900 MG/50ML IV SOLN
900.0000 mg | INTRAVENOUS | Status: AC
  Administered 2019-01-05: 11:00:00 900 mg via INTRAVENOUS

## 2019-01-04 NOTE — Patient Instructions (Signed)
Your procedure is scheduled on: 01/05/19 Report to [place]. To find out your arrival time please call [phone] between 1PM - 3PM on 01/04/19.  Remember: Instructions that are not followed completely may result in serious medical risk, up to and including death, or upon the discretion of your surgeon and anesthesiologist your surgery may need to be rescheduled.     _X__ 1. Do not eat food after midnight the night before your procedure.                 No gum chewing or hard candies. You may drink clear liquids up to 2 hours                 before you are scheduled to arrive for your surgery- DO not drink clear                 liquids within 2 hours of the start of your surgery.                 Clear Liquids include:  water, apple juice without pulp, clear carbohydrate                 drink such as Clearfast or Gatorade, Black Coffee or Tea (Do not add                 anything to coffee or tea). Diabetics water only  __X__2.  On the morning of surgery brush your teeth with toothpaste and water, you                 may rinse your mouth with mouthwash if you wish.  Do not swallow any              toothpaste of mouthwash.     _X__ 3.  No Alcohol for 24 hours before or after surgery.   _X__ 4.  Do Not Smoke or use e-cigarettes For 24 Hours Prior to Your Surgery.                 Do not use any chewable tobacco products for at least 6 hours prior to                 surgery.  ____  5.  Bring all medications with you on the day of surgery if instructed.   __X__  6.  Notify your doctor if there is any change in your medical condition      (cold, fever, infections).     Do not wear jewelry, make-up, hairpins, clips or nail polish. Do not wear lotions, powders, or perfumes.  Do not shave 48 hours prior to surgery. Men may shave face and neck. Do not bring valuables to the hospital.    [facility] is not responsible for any belongings  or valuables.  Contacts, dentures/partials or body piercings may not be worn into surgery. Bring a case for your contacts, glasses or hearing aids, a denture cup will be supplied. Leave your suitcase in the car. After surgery it may be brought to your room. For patients admitted to the hospital, discharge time is determined by your treatment team.   Patients discharged the day of surgery will not be allowed to drive home.   Please read over the following fact sheets that you were given:   MRSA Information  __X__ Take these medicines the morning of surgery with A SIP OF WATER:  1. gabapentin (NEURONTIN) 300 MG capsule  2. HYDROcodone-acetaminophen (NORCO/VICODIN) 5-325 MG tablet IF NEEDED  3.   4.  5.  6.  ____ Fleet Enema (as directed)   ____ Use CHG Soap/SAGE wipes as directed  ____ Use inhalers on the day of surgery  __X__ Stop metformin/Janumet/Farxiga 2 days prior to surgery (TO HOLD DOSE TONIGHT AND IN THE AM)   ____ Take 1/2 of usual insulin dose the night before surgery. No insulin the morning          of surgery.   ____ Stop Blood Thinners Coumadin/Plavix/Xarelto/Pleta/Pradaxa/Eliquis/Effient/Aspirin  on   Or contact your Surgeon, Cardiologist or Medical Doctor regarding  ability to stop your blood thinners  __X__ Stop Anti-inflammatories 7 days before surgery such as Advil, Ibuprofen, Motrin,  BC or Goodies Powder, Naprosyn, Naproxen, Aleve, Aspirin    __X__ Stop all herbal supplements, fish oil or vitamin E until after surgery.    ____ Bring C-Pap to the hospital.

## 2019-01-05 ENCOUNTER — Ambulatory Visit: Payer: PRIVATE HEALTH INSURANCE | Admitting: Certified Registered"

## 2019-01-05 ENCOUNTER — Other Ambulatory Visit: Payer: Self-pay

## 2019-01-05 ENCOUNTER — Ambulatory Visit: Payer: PRIVATE HEALTH INSURANCE

## 2019-01-05 ENCOUNTER — Encounter
Admission: RE | Disposition: A | Discharge status: Home / Self Care | Payer: Self-pay | Source: Home / Self Care | Attending: Orthopedic Surgery

## 2019-01-05 ENCOUNTER — Encounter: Payer: Self-pay | Admitting: Orthopedic Surgery

## 2019-01-05 ENCOUNTER — Ambulatory Visit
Admission: RE | Admit: 2019-01-05 | Discharge: 2019-01-05 | Disposition: A | Discharge status: Home / Self Care | Payer: PRIVATE HEALTH INSURANCE | Attending: Orthopedic Surgery | Admitting: Orthopedic Surgery

## 2019-01-05 DIAGNOSIS — Z79891 Long term (current) use of opiate analgesic: Secondary | ICD-10-CM | POA: Diagnosis not present

## 2019-01-05 DIAGNOSIS — Z79899 Other long term (current) drug therapy: Secondary | ICD-10-CM | POA: Insufficient documentation

## 2019-01-05 DIAGNOSIS — J449 Chronic obstructive pulmonary disease, unspecified: Secondary | ICD-10-CM | POA: Insufficient documentation

## 2019-01-05 DIAGNOSIS — Z7951 Long term (current) use of inhaled steroids: Secondary | ICD-10-CM | POA: Insufficient documentation

## 2019-01-05 DIAGNOSIS — E119 Type 2 diabetes mellitus without complications: Secondary | ICD-10-CM | POA: Insufficient documentation

## 2019-01-05 DIAGNOSIS — F17200 Nicotine dependence, unspecified, uncomplicated: Secondary | ICD-10-CM | POA: Diagnosis not present

## 2019-01-05 DIAGNOSIS — Z7984 Long term (current) use of oral hypoglycemic drugs: Secondary | ICD-10-CM | POA: Insufficient documentation

## 2019-01-05 DIAGNOSIS — X58XXXA Exposure to other specified factors, initial encounter: Secondary | ICD-10-CM | POA: Diagnosis not present

## 2019-01-05 DIAGNOSIS — Z419 Encounter for procedure for purposes other than remedying health state, unspecified: Secondary | ICD-10-CM

## 2019-01-05 DIAGNOSIS — S32050A Wedge compression fracture of fifth lumbar vertebra, initial encounter for closed fracture: Secondary | ICD-10-CM | POA: Diagnosis present

## 2019-01-05 DIAGNOSIS — Z96642 Presence of left artificial hip joint: Secondary | ICD-10-CM | POA: Insufficient documentation

## 2019-01-05 HISTORY — PX: KYPHOPLASTY: SHX5884

## 2019-01-05 LAB — GLUCOSE, CAPILLARY
Glucose-Capillary: 172 mg/dL — ABNORMAL HIGH (ref 70–99)
Glucose-Capillary: 154 mg/dL — ABNORMAL HIGH (ref 70–99)

## 2019-01-05 IMAGING — XA DG C-ARM 1-60 MIN
1 series · 1 of 1 positions shown · IV contrast (agent unspecified)
Comparison: MRI [DATE].

CLINICAL DATA: L5 kyphoplasty.

EXAM:
DG C-ARM 1-60 MIN; LUMBAR SPINE - 2-3 VIEW
CONTRAST:  None.
FLUOROSCOPY TIME:  Fluoroscopy Time:  4 minutes 28 seconds
Radiation Exposure Index (if provided by the fluoroscopic device):
149.2 mGy
Number of Acquired Spot Images: 1

[Series 3: cont. · 1 of 1 slices shown]
[im 1/1]
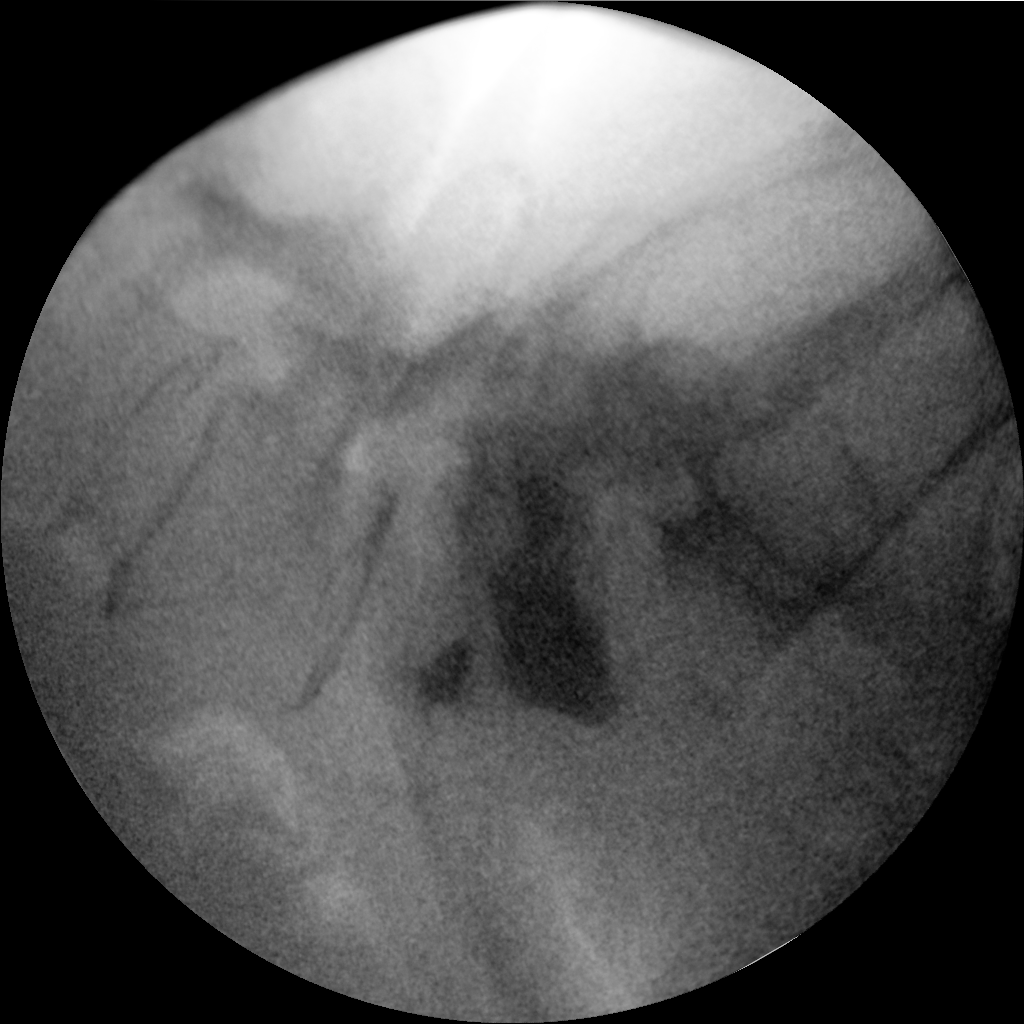

[1 of 1 positions shown; findings below may reference images not displayed]

FINDINGS: Lumbar spine numbered as per prior MRI. L5 vertebroplasty. Stable L4
on L5 anterolisthesis.
IMPRESSION: L5 vertebroplasty.

## 2019-01-05 IMAGING — XA DG LUMBAR SPINE 2-3V
1 series · 1 of 1 positions shown · IV contrast (agent unspecified)
Comparison: MRI [DATE].

CLINICAL DATA: L5 kyphoplasty.

EXAM:
DG C-ARM 1-60 MIN; LUMBAR SPINE - 2-3 VIEW
CONTRAST:  None.
FLUOROSCOPY TIME:  Fluoroscopy Time:  4 minutes 28 seconds
Radiation Exposure Index (if provided by the fluoroscopic device):
149.2 mGy
Number of Acquired Spot Images: 1

[Series 3: cont. · 1 of 1 slices shown]
[im 1/1]
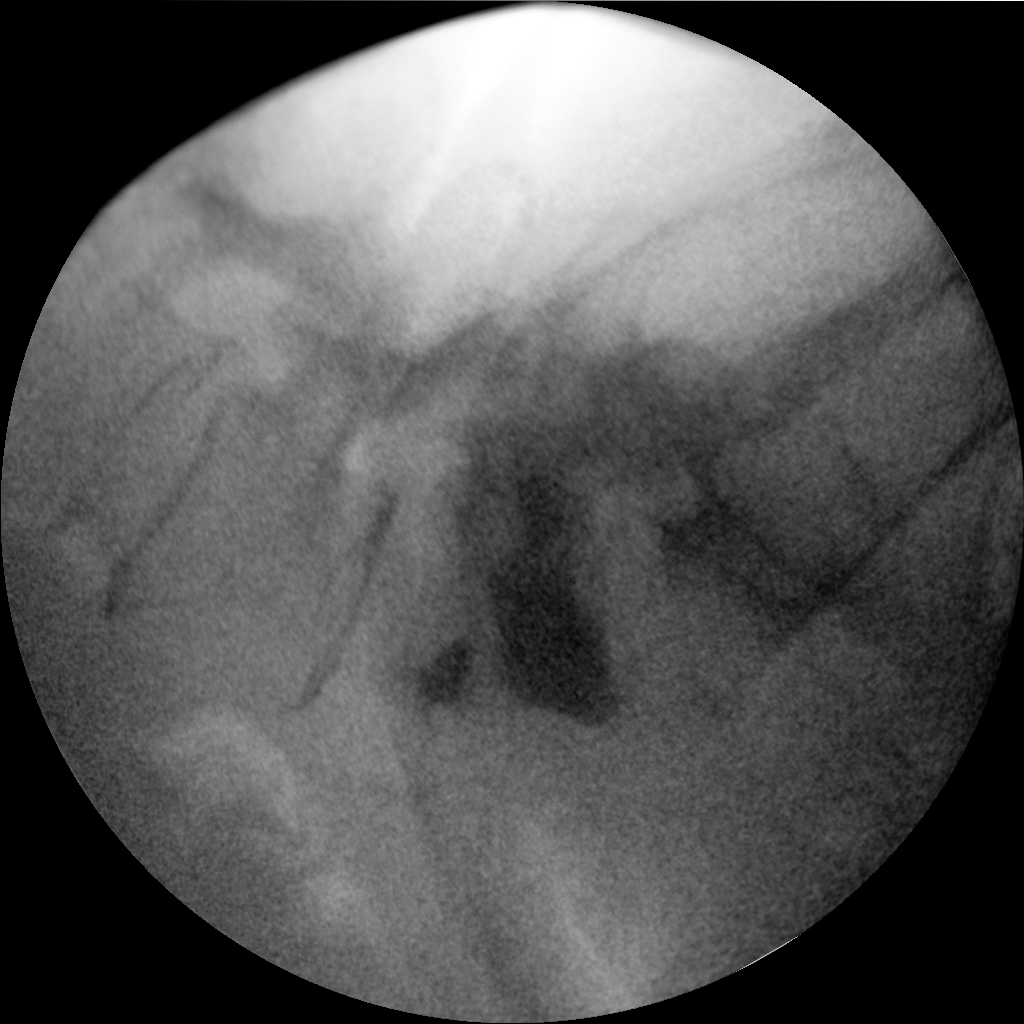

[1 of 1 positions shown; findings below may reference images not displayed]

FINDINGS: Lumbar spine numbered as per prior MRI. L5 vertebroplasty. Stable L4
on L5 anterolisthesis.
IMPRESSION: L5 vertebroplasty.

## 2019-01-05 IMAGING — XA DG LUMBAR SPINE 2-3V
2 series · 2 of 2 positions shown · IV contrast (agent unspecified)
Comparison: MRI [DATE].

CLINICAL DATA: L5 kyphoplasty.

EXAM:
DG C-ARM 1-60 MIN; LUMBAR SPINE - 2-3 VIEW
CONTRAST:  None.
FLUOROSCOPY TIME:  Fluoroscopy Time:  4 minutes 28 seconds
Radiation Exposure Index (if provided by the fluoroscopic device):
149.2 mGy
Number of Acquired Spot Images: 1

[Series 3: ortho standard · 1 of 1 slices shown]
[im 1/1]
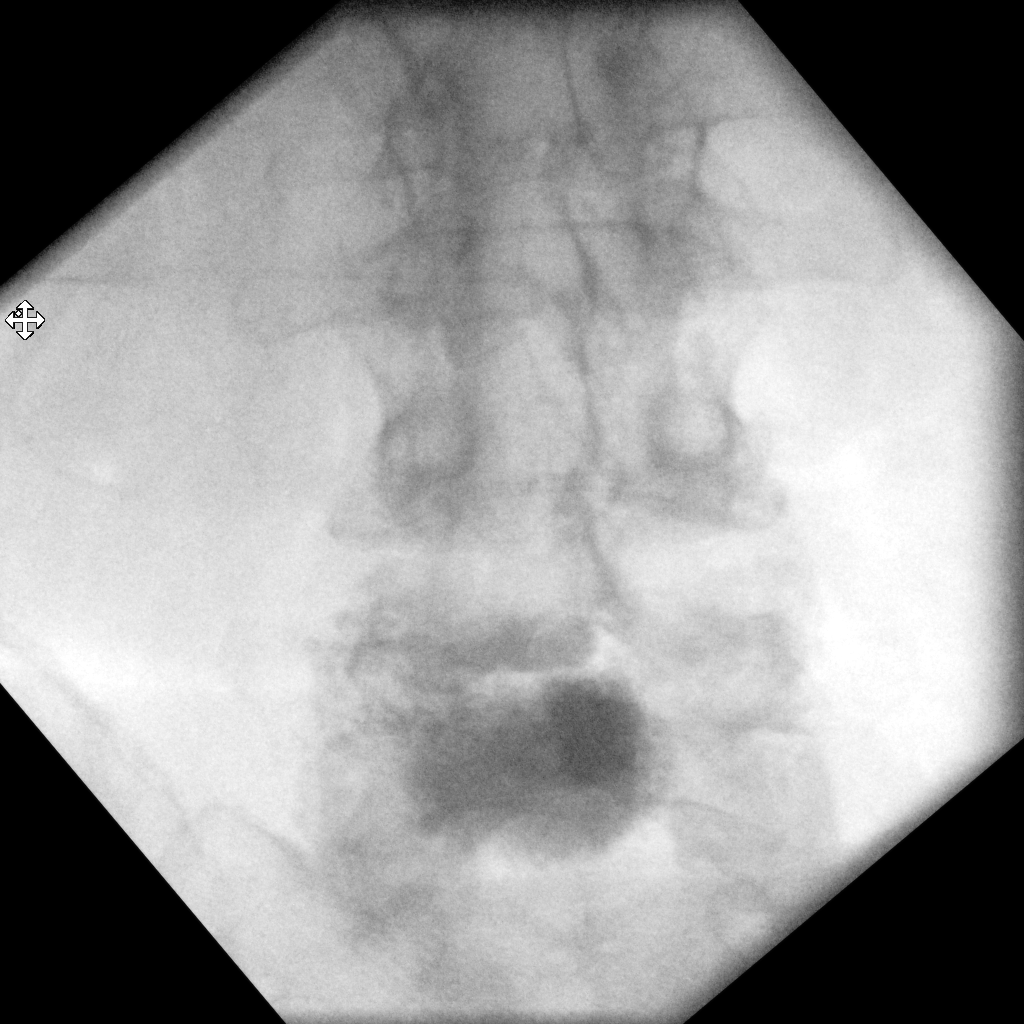

[Series 3: cont. · 1 of 1 slices shown]
[im 1/1]
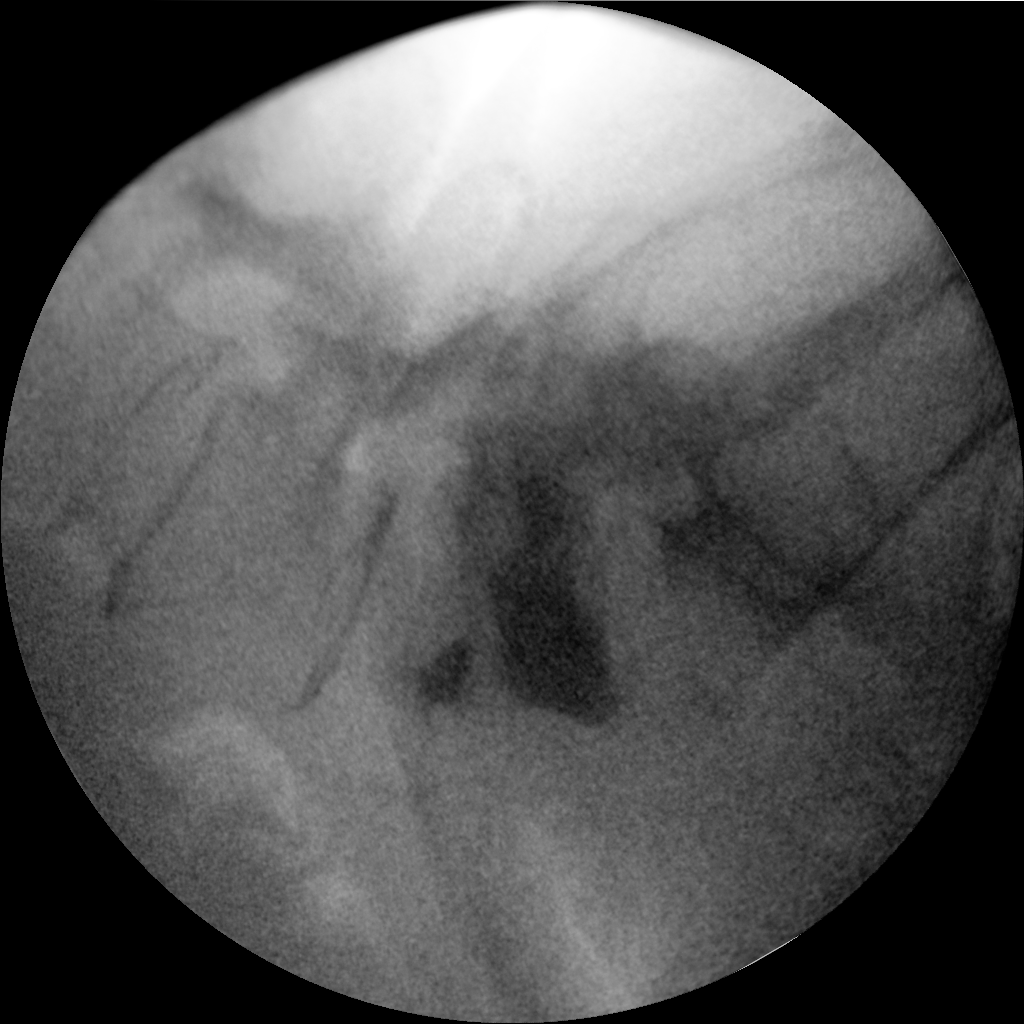

[2 of 2 positions shown; findings below may reference images not displayed]

FINDINGS: Lumbar spine numbered as per prior MRI. L5 vertebroplasty. Stable L4
on L5 anterolisthesis.
IMPRESSION: L5 vertebroplasty.

## 2019-01-05 IMAGING — XA DG C-ARM 1-60 MIN
1 series · 1 of 1 positions shown · IV contrast (agent unspecified)
Comparison: MRI [DATE].

CLINICAL DATA: L5 kyphoplasty.

EXAM:
DG C-ARM 1-60 MIN; LUMBAR SPINE - 2-3 VIEW
CONTRAST:  None.
FLUOROSCOPY TIME:  Fluoroscopy Time:  4 minutes 28 seconds
Radiation Exposure Index (if provided by the fluoroscopic device):
149.2 mGy
Number of Acquired Spot Images: 1

[Series 3: ortho standard · 1 of 1 slices shown]
[im 1/1]
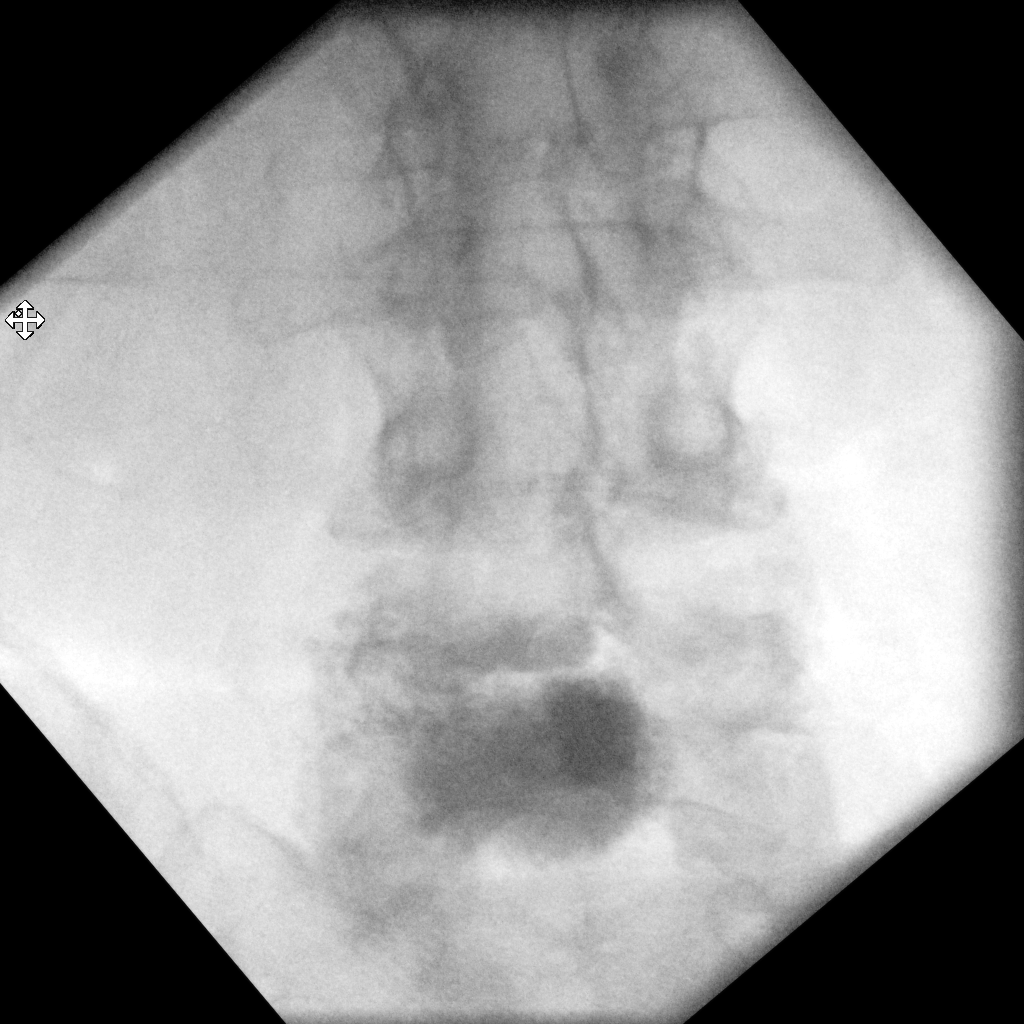

[1 of 1 positions shown; findings below may reference images not displayed]

FINDINGS: Lumbar spine numbered as per prior MRI. L5 vertebroplasty. Stable L4
on L5 anterolisthesis.
IMPRESSION: L5 vertebroplasty.

## 2019-01-05 IMAGING — XA DG C-ARM 1-60 MIN
1 series · 1 of 1 positions shown · IV contrast (agent unspecified)
Comparison: MRI [DATE].

CLINICAL DATA: L5 kyphoplasty.

EXAM:
DG C-ARM 1-60 MIN; LUMBAR SPINE - 2-3 VIEW
CONTRAST:  None.
FLUOROSCOPY TIME:  Fluoroscopy Time:  4 minutes 28 seconds
Radiation Exposure Index (if provided by the fluoroscopic device):
149.2 mGy
Number of Acquired Spot Images: 1

[Series 3: ortho standard · 1 of 1 slices shown]
[im 1/1]
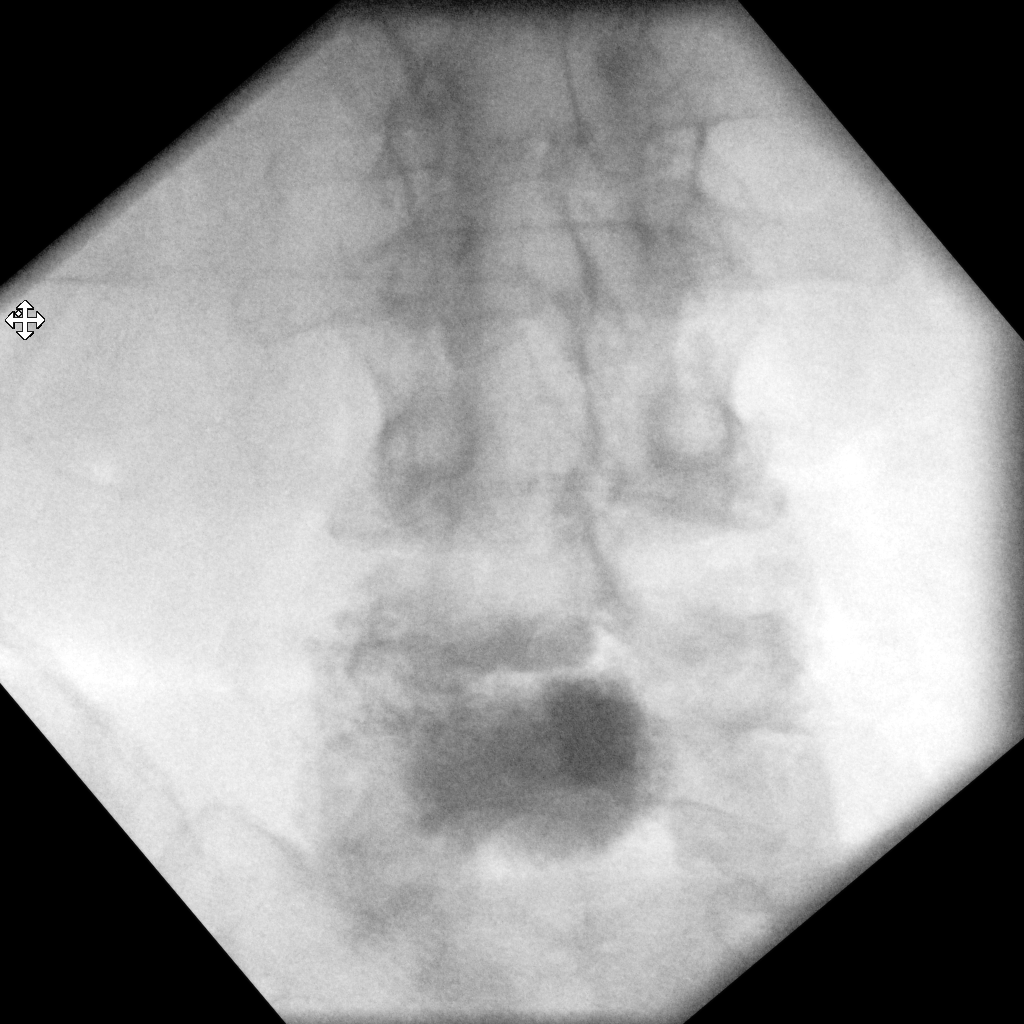

[1 of 1 positions shown; findings below may reference images not displayed]

FINDINGS: Lumbar spine numbered as per prior MRI. L5 vertebroplasty. Stable L4
on L5 anterolisthesis.
IMPRESSION: L5 vertebroplasty.

## 2019-01-05 SURGERY — KYPHOPLASTY
Anesthesia: General | Site: Spine Lumbar

## 2019-01-05 MED ORDER — LIDOCAINE HCL (CARDIAC) PF 100 MG/5ML IV SOSY
PREFILLED_SYRINGE | INTRAVENOUS | Status: DC | PRN
  Administered 2019-01-05: 40 mg via INTRATRACHEAL

## 2019-01-05 MED ORDER — FENTANYL CITRATE (PF) 100 MCG/2ML IJ SOLN
25.0000 ug | INTRAMUSCULAR | Status: DC | PRN
  Administered 2019-01-05: 25 ug via INTRAVENOUS

## 2019-01-05 MED ORDER — MIDAZOLAM HCL 2 MG/2ML IJ SOLN
INTRAMUSCULAR | Status: AC
  Filled 2019-01-05: qty 2

## 2019-01-05 MED ORDER — CLINDAMYCIN PHOSPHATE 900 MG/50ML IV SOLN
INTRAVENOUS | Status: AC
  Filled 2019-01-05: qty 50

## 2019-01-05 MED ORDER — HYDROCODONE-ACETAMINOPHEN 5-325 MG PO TABS: 1.0000 | ORAL_TABLET | Freq: Four times a day (QID) | ORAL | 0 refills | Status: DC | PRN

## 2019-01-05 MED ORDER — FAMOTIDINE 20 MG PO TABS: 20.0000 mg | ORAL_TABLET | Freq: Once | ORAL | Status: AC

## 2019-01-05 MED ORDER — FENTANYL CITRATE (PF) 100 MCG/2ML IJ SOLN
INTRAMUSCULAR | Status: AC
  Filled 2019-01-05: qty 2

## 2019-01-05 MED ORDER — LIDOCAINE HCL 1 % IJ SOLN
INTRAMUSCULAR | Status: DC | PRN
  Administered 2019-01-05: 30 mL

## 2019-01-05 MED ORDER — BUPIVACAINE-EPINEPHRINE (PF) 0.5% -1:200000 IJ SOLN
INTRAMUSCULAR | Status: DC | PRN
  Administered 2019-01-05: 20 mL

## 2019-01-05 MED ORDER — FAMOTIDINE 20 MG PO TABS
ORAL_TABLET | ORAL | Status: AC
  Administered 2019-01-05: 20 mg via ORAL
  Filled 2019-01-05: qty 1

## 2019-01-05 MED ORDER — SODIUM CHLORIDE 0.9 % IV SOLN: INTRAVENOUS | Status: DC | PRN

## 2019-01-05 MED ORDER — SODIUM CHLORIDE 0.9 % IV SOLN: INTRAVENOUS | Status: DC

## 2019-01-05 MED ORDER — PROPOFOL 500 MG/50ML IV EMUL
INTRAVENOUS | Status: DC | PRN
  Administered 2019-01-05: 50 ug/kg/min via INTRAVENOUS

## 2019-01-05 MED ORDER — CHLORHEXIDINE GLUCONATE 4 % EX LIQD
60.0000 mL | Freq: Once | CUTANEOUS | Status: AC
  Administered 2019-01-05: 4 via TOPICAL

## 2019-01-05 MED ORDER — MIDAZOLAM HCL 2 MG/2ML IJ SOLN
INTRAMUSCULAR | Status: DC | PRN
  Administered 2019-01-05 (×2): 1 mg via INTRAVENOUS

## 2019-01-05 SURGICAL SUPPLY — 19 items
CEMENT KYPHON CX01A KIT/MIXER (Cement) ×2 IMPLANT
COVER WAND RF STERILE (DRAPES) ×2 IMPLANT
DERMABOND ADVANCED (GAUZE/BANDAGES/DRESSINGS) ×1
DERMABOND ADVANCED .7 DNX12 (GAUZE/BANDAGES/DRESSINGS) ×1 IMPLANT
DEVICE BIOPSY BONE KYPHX (INSTRUMENTS) ×2 IMPLANT
DRAPE C-ARM XRAY 36X54 (DRAPES) ×2 IMPLANT
DURAPREP 26ML APPLICATOR (WOUND CARE) ×2 IMPLANT
FEE RENTAL RFA GENERATOR (MISCELLANEOUS) IMPLANT
GLOVE SURG SYN 9.0  PF PI (GLOVE) ×1
GLOVE SURG SYN 9.0 PF PI (GLOVE) ×1 IMPLANT
GOWN SRG 2XL LVL 4 RGLN SLV (GOWNS) ×1 IMPLANT
GOWN STRL NON-REIN 2XL LVL4 (GOWNS) ×1
GOWN STRL REUS W/ TWL LRG LVL3 (GOWN DISPOSABLE) ×1 IMPLANT
GOWN STRL REUS W/TWL LRG LVL3 (GOWN DISPOSABLE) ×1
PACK KYPHOPLASTY (MISCELLANEOUS) ×2 IMPLANT
RENTAL RFA GENERATOR (MISCELLANEOUS) IMPLANT
STRAP SAFETY 5IN WIDE (MISCELLANEOUS) ×2 IMPLANT
TRAY KYPHOPAK 15/3 EXPRESS 1ST (MISCELLANEOUS) ×1 IMPLANT
TRAY KYPHOPAK 20/3 EXPRESS 1ST (MISCELLANEOUS) ×2 IMPLANT

## 2019-01-05 NOTE — Transfer of Care (Signed)
Immediate Anesthesia Transfer of Care Note  Patient: Courtney Henderson  Procedure(s) Performed: KYPHOPLASTY L5 (N/A Spine Lumbar)  Patient Location: PACU  Anesthesia Type:General  Level of Consciousness: awake, alert  and oriented  Airway & Oxygen Therapy: Patient Spontanous Breathing  Post-op Assessment: Report given to RN and Post -op Vital signs reviewed and stable  Post vital signs: Reviewed  Last Vitals:  Vitals Value Taken Time  BP 136/66 01/05/19 1141  Temp 36.6 C 01/05/19 1141  Pulse 85 01/05/19 1141  Resp 15 01/05/19 1141  SpO2 97 % 01/05/19 1141  Vitals shown include unvalidated device data.  Last Pain:  Vitals:   01/05/19 0931  TempSrc: Tympanic  PainSc: 10-Worst pain ever         Complications: No apparent anesthesia complications

## 2019-01-05 NOTE — Anesthesia Preprocedure Evaluation (Signed)
Anesthesia Evaluation  Patient identified by MRN, date of birth, ID band Patient awake    Reviewed: Allergy & Precautions, H&P , NPO status , Patient's Chart, lab work & pertinent test results, reviewed documented beta blocker date and time   History of Anesthesia Complications (+) PROLONGED EMERGENCE and history of anesthetic complications  Airway Mallampati: III  TM Distance: >3 FB Neck ROM: full    Dental  (+) Dental Advidsory Given, Edentulous Upper, Edentulous Lower, Upper Dentures, Lower Dentures   Pulmonary neg shortness of breath, neg sleep apnea, COPD,  COPD inhaler, neg recent URI, Current Smoker and Patient abstained from smoking.,    Pulmonary exam normal        Cardiovascular Exercise Tolerance: Good negative cardio ROS Normal cardiovascular exam     Neuro/Psych neg Seizures  Neuromuscular disease negative psych ROS   GI/Hepatic negative GI ROS, Neg liver ROS,   Endo/Other  diabetes, Well Controlled, Type 2, Oral Hypoglycemic Agents  Renal/GU negative Renal ROS  negative genitourinary   Musculoskeletal   Abdominal   Peds  Hematology negative hematology ROS (+)   Anesthesia Other Findings Past Medical History: No date: Complication of anesthesia     Comment:  long time to wake up, headache No date: Diabetes mellitus without complication (HCC) No date: Lymphedema No date: Neuromuscular disorder (HCC)   Reproductive/Obstetrics negative OB ROS                             Anesthesia Physical Anesthesia Plan  ASA: III  Anesthesia Plan: General   Post-op Pain Management:    Induction: Intravenous  PONV Risk Score and Plan: 2 and Propofol infusion and TIVA  Airway Management Planned: Natural Airway and Nasal Cannula  Additional Equipment:   Intra-op Plan:   Post-operative Plan:   Informed Consent: I have reviewed the patients History and Physical, chart, labs and  discussed the procedure including the risks, benefits and alternatives for the proposed anesthesia with the patient or authorized representative who has indicated his/her understanding and acceptance.     Dental Advisory Given  Plan Discussed with: Anesthesiologist, CRNA and Surgeon  Anesthesia Plan Comments:         Anesthesia Quick Evaluation

## 2019-01-05 NOTE — Anesthesia Postprocedure Evaluation (Signed)
Anesthesia Post Note  Patient: ROSALBA TOTTY  Procedure(s) Performed: KYPHOPLASTY L5 (N/A Spine Lumbar)  Patient location during evaluation: Endoscopy Anesthesia Type: General Level of consciousness: awake and alert Pain management: pain level controlled Vital Signs Assessment: post-procedure vital signs reviewed and stable Respiratory status: spontaneous breathing, nonlabored ventilation, respiratory function stable and patient connected to nasal cannula oxygen Cardiovascular status: blood pressure returned to baseline and stable Postop Assessment: no apparent nausea or vomiting Anesthetic complications: no     Last Vitals:  Vitals:   01/05/19 1224 01/05/19 1312  BP:  133/78  Pulse:  78  Resp:  18  Temp:    SpO2: 94%     Last Pain:  Vitals:   01/05/19 1312  TempSrc:   PainSc: 5                  Martha Clan

## 2019-01-05 NOTE — Op Note (Signed)
Date 01/05/2019  time 11:35 AM   PATIENT:  Courtney Henderson   PRE-OPERATIVE DIAGNOSIS:  closed wedge compression fracture of L5   POST-OPERATIVE DIAGNOSIS:  closed wedge compression fracture of L5   PROCEDURE:  Procedure(s): KYPHOPLASTY L5  SURGEON: Laurene Footman, MD   ASSISTANTS: None   ANESTHESIA:   local and MAC   EBL:  No intake/output data recorded.   BLOOD ADMINISTERED:none   DRAINS: none    LOCAL MEDICATIONS USED:  MARCAINE    and XYLOCAINE    SPECIMEN:   L5 vertebral body biopsy   DISPOSITION OF SPECIMEN:  Pathology   COUNTS:  YES   TOURNIQUET:  * No tourniquets in log *   IMPLANTS: Bone cement   DICTATION: .Dragon Dictation  patient was brought to the operating room and after adequate anesthesia was obtained the patient was placed prone.  C arm was brought in in good visualization of the affected level obtained on both AP and lateral projections.  After patient identification and timeout procedures were completed, local anesthetic was infiltrated with 10 cc 1% Xylocaine infiltrated subcutaneously.  This is done the area on the each side of the planned approach.  The back was then prepped and draped in the usual sterile manner and repeat timeout procedure carried out.  A spinal needle was brought down to the pedicle on the each side of  L5 and a 50-50 mix of 1% Xylocaine half percent Sensorcaine with epinephrine total of 20 cc injected.  After allowing this to set a small incision was made on the right side and the trocar was advanced into the vertebral body in an extrapedicular fashion.  Biopsy was obtained Drilling was carried out balloon inserted with inflation to  3 cc.  The balloon across the midline and so a second approach was not required When the cement was appropriate consistency 6 cc were injected into the vertebral body without extravasation, good fill superior to inferior endplates and from right to left sides along the inferior endplate.  After the cement had  set the trochar was removed and permanent C-arm views obtained.  The wound was closed with Dermabond followed by Band-Aid   PLAN OF CARE: Discharge to home after PACU   PATIENT DISPOSITION:  PACU - hemodynamically stable.

## 2019-01-05 NOTE — H&P (Signed)
Reviewed paper H+P, will be scanned into chart. No changes noted.  

## 2019-01-05 NOTE — Anesthesia Post-op Follow-up Note (Signed)
Anesthesia QCDR form completed.        

## 2019-01-05 NOTE — Discharge Instructions (Addendum)
Take it easy today and tomorrow.  Remove Band-Aid and then okay to shower starting Saturday.  Try to avoid lifting over 5 pounds for 2 weeks.  Pain medicine as directed  AMBULATORY SURGERY  DISCHARGE INSTRUCTIONS   1) The drugs that you were given will stay in your system until tomorrow so for the next 24 hours you should not:  A) Drive an automobile B) Make any legal decisions C) Drink any alcoholic beverage   2) You may resume regular meals tomorrow.  Today it is better to start with liquids and gradually work up to solid foods.  You may eat anything you prefer, but it is better to start with liquids, then soup and crackers, and gradually work up to solid foods.   3) Please notify your doctor immediately if you have any unusual bleeding, trouble breathing, redness and pain at the surgery site, drainage, fever, or pain not relieved by medication.    4) Additional Instructions:        Please contact your physician with any problems or Same Day Surgery at [phone], Monday through Friday 6 am to 4 pm, or [place] at [facility] number at [phone].AMBULATORY SURGERY  DISCHARGE INSTRUCTIONS   5) The drugs that you were given will stay in your system until tomorrow so for the next 24 hours you should not:  D) Drive an automobile E) Make any legal decisions F) Drink any alcoholic beverage   6) You may resume regular meals tomorrow.  Today it is better to start with liquids and gradually work up to solid foods.  You may eat anything you prefer, but it is better to start with liquids, then soup and crackers, and gradually work up to solid foods.   7) Please notify your doctor immediately if you have any unusual bleeding, trouble breathing, redness and pain at the surgery site, drainage, fever, or pain not relieved by medication.   8) Additional Instructions:   Please contact your physician with any problems or Same Day Surgery at [phone], Monday through  Friday 6 am to 4 pm, or [place] at [facility] number at [phone].

## 2019-01-06 LAB — SURGICAL PATHOLOGY

## 2019-01-11 ENCOUNTER — Ambulatory Visit: Payer: PRIVATE HEALTH INSURANCE | Admitting: Podiatry

## 2019-01-23 ENCOUNTER — Encounter: Payer: Self-pay | Admitting: Student in an Organized Health Care Education/Training Program

## 2019-01-24 ENCOUNTER — Ambulatory Visit
Payer: PRIVATE HEALTH INSURANCE | Attending: Student in an Organized Health Care Education/Training Program | Admitting: Student in an Organized Health Care Education/Training Program

## 2019-01-24 ENCOUNTER — Other Ambulatory Visit: Payer: Self-pay

## 2019-01-24 ENCOUNTER — Encounter: Payer: Self-pay | Admitting: Student in an Organized Health Care Education/Training Program

## 2019-01-24 DIAGNOSIS — M4727 Other spondylosis with radiculopathy, lumbosacral region: Secondary | ICD-10-CM

## 2019-01-24 DIAGNOSIS — M48061 Spinal stenosis, lumbar region without neurogenic claudication: Secondary | ICD-10-CM

## 2019-01-24 DIAGNOSIS — M47816 Spondylosis without myelopathy or radiculopathy, lumbar region: Secondary | ICD-10-CM

## 2019-01-24 DIAGNOSIS — E1142 Type 2 diabetes mellitus with diabetic polyneuropathy: Secondary | ICD-10-CM | POA: Diagnosis not present

## 2019-01-24 DIAGNOSIS — M4726 Other spondylosis with radiculopathy, lumbar region: Secondary | ICD-10-CM | POA: Diagnosis not present

## 2019-01-24 DIAGNOSIS — M5416 Radiculopathy, lumbar region: Secondary | ICD-10-CM

## 2019-01-24 DIAGNOSIS — M48062 Spinal stenosis, lumbar region with neurogenic claudication: Secondary | ICD-10-CM

## 2019-01-24 MED ORDER — PREDNISONE 20 MG PO TABS: ORAL_TABLET | ORAL | 0 refills | Status: AC

## 2019-01-24 NOTE — Progress Notes (Signed)
Virtual Encounter - Pain Management PROVIDER NOTE: Information contained herein reflects review and annotations entered in association with encounter. Interpretation of such information and data should be left to medically-trained personnel. Information provided to patient can be located elsewhere in the medical record under "Patient Instructions". Document created using STT-dictation technology, any transcriptional errors that may result from process are unintentional.    Contact & Pharmacy Preferred: [phone] Home: [phone] (home) Mobile: [phone] (mobile) E-mail: No e-mail address on record  [id number] [place], [place] - [address] [place] [address] Phone: [phone] Fax: [phone]   Pre-screening  Courtney Henderson offered "in-person" vs "virtual" encounter. She indicated preferring virtual for this encounter.   Reason COVID-19*  Social distancing based on CDC and AMA recommendations.   I contacted Courtney Henderson on 01/24/2019 via telephone.      I clearly identified myself as Gillis Santa, MD. I verified that I was speaking with the correct person using two identifiers (Name: Courtney Henderson, and date of birth: 01-Aug-1960).   This visit was completed via telephone due to the restrictions of the COVID-19 pandemic. All issues as above were discussed and addressed but no physical exam was performed. If it was felt that the patient should be evaluated in the office, they were directed there. The patient verbally consented to this visit. Patient was unable to complete an audio/visual visit due to Technical difficulties and/or Lack of internet. Due to the catastrophic nature of the COVID-19 pandemic, this visit was done through audio contact only.  Location of the patient: home address (see Epic for details)  Location of the provider: office  Consent I sought verbal advanced consent from Courtney Henderson for virtual visit interactions. I informed Courtney Henderson of possible security and privacy concerns, risks, and limitations associated with providing "not-in-person" medical evaluation and management services. I also informed Courtney Henderson of the availability of "in-person" appointments. Finally, I informed her that there would be a charge for the virtual visit and that she could be  personally, fully or partially, financially responsible for it. Courtney Henderson expressed understanding and agreed to proceed.   Historic Elements   Courtney Henderson is a 59 y.o. year old, female patient evaluated today after her last encounter by our practice on 12/28/2018. Courtney Henderson  has a past medical history of Complication of anesthesia, Diabetes mellitus without complication ([place]), Lymphedema, and Neuromuscular disorder ([place]). She also  has a past surgical history that includes Multiple tooth extractions (06/2018); Tubal ligation; Cataract extraction, bilateral; Joint replacement; and Kyphoplasty (N/A, 01/05/2019). Courtney Henderson has a current medication list which includes the following prescription(s): contour next ez monitor, breo ellipta, furosemide, gabapentin, glucose blood, hydrocodone-acetaminophen, ibuprofen, klor-con 10, latanoprost, metformin, microlet lancets, and prednisone. She  reports that she has been smoking cigarettes. She has been smoking about 1.50 packs per day. She has never used smokeless tobacco. She reports previous alcohol use. She reports that she does not use drugs. Courtney Henderson is allergic to amoxicillin; morphine and related; and penicillins.   HPI  Today, she is being contacted for follow-up evaluation   s/p L5 kyphoplasty on 01/05/19 for L5 compression fracture.  Patient states that she is healing well from the surgery but is still having difficulty with walking and pain in her lower back radiating to bilateral buttocks and bilateral legs.  She also endorses muscle tightness and knots in her posterior thighs.  She feels that her situation is getting  worse and finds it difficult to perform activities of daily living given her pain and difficulty ambulating.  In the past we have done lumbar epidural steroid injection at L4-L5 which was not effective.  Patient did obtain moderate benefit with lumbar radiofrequency ablations which have been helpful for her specifically in regards to her axial low back pain.   UDS:  Summary  Date Value Ref Range Status  09/07/2017 FINAL  Final    Comment:    ==================================================================== [facility] COMP DRUG ANALYSIS,UR ==================================================================== Test                             Result       Flag       Units Drug Present and Declared for Prescription Verification   Gabapentin                     PRESENT      EXPECTED   Ibuprofen                      PRESENT      EXPECTED Drug Present not Declared for Prescription Verification   Diphenhydramine                PRESENT      UNEXPECTED ==================================================================== Test                      Result    Flag   Units      Ref Range   Creatinine              130              mg/dL      >=16>=20 ==================================================================== Declared Medications:  The flagging and interpretation on this report are based on the  following declared medications.  Unexpected results may arise from  inaccuracies in the declared medications.  **Note: The testing scope of this panel includes these medications:  Gabapentin (Neurontin)  **Note: The testing scope of this panel does not include small to  moderate amounts of these reported medications:  Ibuprofen (Advil)  **Note: The testing scope of this panel does not include following  reported medications:  Bromfenac (Prolensa)  Fluticasone (Breo)  Furosemide (Lasix)  Magnesium  Metformin  Mometasone  Potassium  Timolol  Vilanterol (Breo)  Vitamin  C ==================================================================== For clinical consultation, please call [phone]. ====================================================================      Imaging  DG Lumbar Spine 2-3 Views CLINICAL DATA:  L5 kyphoplasty.  EXAM: DG C-ARM 1-60 MIN; LUMBAR SPINE - 2-3 VIEW  CONTRAST:  None.  FLUOROSCOPY TIME:  Fluoroscopy Time:  4 minutes 28 seconds  Radiation Exposure Index (if provided by the fluoroscopic device): 149.2 mGy  Number of Acquired Spot Images: 1  COMPARISON:  MRI 12/30/2018.  FINDINGS: Lumbar spine numbered as per prior MRI. L5 vertebroplasty. Stable L4 on L5 anterolisthesis.  IMPRESSION: L5 vertebroplasty.  Electronically Signed   By: Maisie Fushomas  Register   On: 01/05/2019 12:26 DG C-Arm 1-60 Min CLINICAL DATA:  L5 kyphoplasty.  EXAM: DG C-ARM 1-60 MIN; LUMBAR SPINE - 2-3 VIEW  CONTRAST:  None.  FLUOROSCOPY TIME:  Fluoroscopy Time:  4 minutes 28 seconds  Radiation Exposure Index (if provided by the fluoroscopic device): 149.2 mGy  Number of Acquired Spot Images: 1  COMPARISON:  MRI 12/30/2018.  FINDINGS: Lumbar spine numbered as per prior MRI. L5 vertebroplasty. Stable L4 on L5  anterolisthesis.  IMPRESSION: L5 vertebroplasty.  Electronically Signed   By: Maisie Fus  Register   On: 01/05/2019 12:26  L-MRI 12/30/18  CLINICAL DATA:  Compression fracture, bilateral buttocks and hip pain  EXAM: MRI LUMBAR SPINE WITHOUT CONTRAST  TECHNIQUE: Multiplanar, multisequence MR imaging of the lumbar spine was performed. No intravenous contrast was administered.  COMPARISON:  CT December 27, 2018  FINDINGS: Segmentation: There are 5 non-rib bearing lumbar type vertebral bodies with the last intervertebral disc space labeled as L5-S1.  Alignment: There is a grade 1 anterolisthesis of L4 on L5 measuring 5 mm which dates back to Jun 18, 2017. A minimal retrolisthesis of L2 on L3 and L3 on  L4.  Vertebrae: There is a subacute superior compression fracture of the L5 vertebral body with approximately 25% loss in height. There is fluid seen within the inter disc space and between the facet joints at L4-L5. There is buckling of the posterosuperior cortex of L5. No retropulsion of fragments is seen. There is fatty endplate changes seen at L1-L2. Disc height is noted throughout the lumbar spine with small anterior osteophytes.  Conus medullaris and cauda equina: Conus extends to the L1 level. Conus and cauda equina appear normal.  Paraspinal and other soft tissues: There is edema/fluid seen between the inter spinous ligaments at L3-L4 and L4-L5 likely due to ligamentous sprain. The sacroiliac joints are intact.  Disc levels:  T12-L1: There is a broad-based disc bulge with facet arthrosis which causes mild bilateral neural foraminal narrowing.  L1-L2: There is a broad-based disc bulge which is asymmetric to the right with moderate bilateral neural foraminal narrowing. There is mild central canal narrowing  L2-L3: There is a broad-based disc bulge with facet arthrosis and ligamentum flavum hypertrophy which causes severe bilateral neural foraminal narrowing and mild central canal stenosis.  L3-L4: There is a broad-based disc bulge with facet arthrosis and ligamentum flavum hypertrophy which causes severe left and moderate to severe right neural foraminal narrowing. The central thecal sac measures 6 mm in AP diameter.  L4-L5: There is buckling of the posterosuperior cortex of L5 with disc uncovering and a broad-based disc bulge. There is facet arthrosis and ligamentum flavum hypertrophy. Severe bilateral neural foraminal narrowing is seen. The central thecal sac is effaced measuring 3 mm in AP diameter. There is redundancy of the nerve roots seen at this level.  L5-S1:   No significant canal or neural foraminal narrowing.  IMPRESSION: 1. Subacute  compression fracture of the L5 vertebral body with approximately 25% loss in vertebral body height and buckling of the posterosuperior cortex. No retropulsion of fragments is seen. 2. Findings suggestive of ligamentous sprain of the intraspinous ligaments of L3-L4 and L4-L5. 3. Grade 1 anterolisthesis of L4 on L5 and a minimal retrolisthesis of L2 on L3 and L3 on L4. This is unchanged from prior exam dating back to 2019. 4. Lumbar spine spondylosis most notable at L4-L5 with severe bilateral neural foraminal narrowing and severe central canal stenosis.  These results will be called to the ordering clinician or representative by the Radiologist Assistant, and communication documented in the PACS or zVision Dashboard  Assessment  The primary encounter diagnosis was Spinal stenosis, lumbar region, with neurogenic claudication. Diagnoses of Lumbar radiculopathy, Lumbar foraminal stenosis, Lumbosacral spondylosis with radiculopathy, Lumbar facet arthropathy, and Diabetic polyneuropathy associated with type 2 diabetes mellitus (HCC) were also pertinent to this visit.  Plan of Care  I am having Courtney Henderson start on predniSONE. I am also having her  maintain her metFORMIN, ibuprofen, Klor-Con 10, furosemide, Breo Ellipta, CONTOUR NEXT EZ MONITOR, glucose blood, Microlet Lancets, latanoprost, gabapentin, and HYDROcodone-acetaminophen.  I had extensive discussion with the patient regarding treatment plan.  After reviewing her lumbar spine MRI from 12/30/2018, I recommend patient follow-up with neurosurgery.  She has severe multilevel lumbar canal and neuroforaminal stenosis which is most pronounced at L3-L4.  There also seems to be ligamentous sprain of the intraspinous ligaments at L3-L4 and L4-L5.  Patient is having worsening pain in her lower back and buttock regions.  Given that she is having difficulty walking and ambulating, I am concerned about her lumbar spine pathology specifically her  foraminal stenosis that could be contributing to her weakness.  We could consider an epidural steroid injection at L4-L5 in the context of her kyphoplasty, I would like to avoid any neuraxial steroid-based procedures for at least 6 weeks after her kyphoplasty to allow for healing.  I will refer her for second opinion with neurosurgery at [facility].  She was seen by Dr. Marcell Barlow at [facility] who recommended that she stop smoking for at least 3 months before considering surgery.  Patient was not very pleased with this recommendation.  Based upon her most recent MRI, I will place a referral for a second opinion to discuss lumbar spine decompression for multilevel lumbar foraminal stenosis.   Pharmacotherapy (Medications Ordered): Meds ordered this encounter  Medications  . predniSONE (DELTASONE) 20 MG tablet    Sig: Take 3 tablets (60 mg total) by mouth daily with breakfast for 3 days, THEN 2 tablets (40 mg total) daily with breakfast for 3 days, THEN 1 tablet (20 mg total) daily with breakfast for 3 days.    Dispense:  18 tablet    Refill:  0   Orders:  Orders Placed This Encounter  Procedures  . [facility]    Referral Priority:   Routine    Referral Type:   Surgical    Referral Reason:   Specialty Services Required    Referred to Provider:   Lisbeth Renshaw, MD    Requested Specialty:   Neurosurgery    Number of Visits Requested:   1   Follow-up plan:   Return in about 4 weeks (around 02/21/2019) for Medication Management.     s/p 2 diagnostic lumbar facet medial branch nerve blocks at bilateral L3, L4, L5, S1 on 04/06/2018 and 07/18/2018.  These procedures provided her with significant pain relief in regards to her axial low back and buttock pain; greater than 75% for approximately 4 weeks and ongoing from her most recent facet block.  lumbar facet medial branch RFA L3, L4, L5, S1 LEFT- November 30, 2018, RIGHT 12/14/2018.      Recent Visits Date Type Provider Dept  12/14/18  Procedure visit Edward Jolly, MD [facility]  11/30/18 Procedure visit Edward Jolly, MD [facility]  Showing recent visits within past 90 days and meeting all other requirements   Today's Visits Date Type Provider Dept  01/24/19 Office Visit Edward Jolly, MD [facility]  Showing today's visits and meeting all other requirements   Future Appointments No visits were found meeting these conditions.  Showing future appointments within next 90 days and meeting all other requirements   I discussed the assessment and treatment plan with the patient. The patient was provided an opportunity to ask questions and all were answered. The patient agreed with the plan and demonstrated an understanding of the instructions.  Patient advised to call back  or seek an in-person evaluation if the symptoms or condition worsens.  Total duration of non-face-to-face encounter: 40 minutes.  Note by: Edward Jolly, MD Date: 01/24/2019; Time: 12:32 PM

## 2019-01-30 ENCOUNTER — Encounter: Payer: Self-pay | Admitting: Occupational Therapy

## 2019-01-30 ENCOUNTER — Ambulatory Visit: Payer: PRIVATE HEALTH INSURANCE | Attending: Podiatry | Admitting: Occupational Therapy

## 2019-01-30 ENCOUNTER — Other Ambulatory Visit: Payer: Self-pay

## 2019-01-30 DIAGNOSIS — I890 Lymphedema, not elsewhere classified: Secondary | ICD-10-CM | POA: Insufficient documentation

## 2019-01-30 NOTE — Therapy (Signed)
[place] MAIN [facility] SERVICES [address] [place], [place], [address] Phone: [phone]   Fax:  [phone]  Occupational Therapy Evaluation  Patient Details  Name: Courtney Henderson MRN: [id number] Date of Birth: November 11, 1960 Referring Provider (OT): Tyson Dense, [place]   Encounter Date: 01/30/2019  OT End of Session - 01/30/19 1349    Visit Number  1    Number of Visits  6    Date for OT Re-Evaluation  04/30/19    OT Start Time  0902    OT Stop Time  1010    OT Time Calculation (min)  68 min       Past Medical History:  Diagnosis Date  . Complication of anesthesia    long time to wake up, headache  . Diabetes mellitus without complication ([place])   . Lymphedema   . Neuromuscular disorder [facility])     Past Surgical History:  Procedure Laterality Date  . CATARACT EXTRACTION, BILATERAL    . JOINT REPLACEMENT    . KYPHOPLASTY N/A 01/05/2019   Procedure: KYPHOPLASTY L5;  Surgeon: Hessie Knows, MD;  Location: [facility] ORS;  Service: Orthopedics;  Laterality: N/A;  . MULTIPLE TOOTH EXTRACTIONS  06/2018   2-3 weeks ago; 10 teeth  . TUBAL LIGATION      There were no vitals filed for this visit.  Subjective Assessment - 01/30/19 0857    Subjective   Courtney Henderson is a 59 y o female referred to Occupational Therapy by Tyson Dense, DPM, for a new evaluation and treatment of Stage II secondary BLE phlebo-lymphedema. Pt was seen previously for initial evaluation on 11/28/2018. She attended one treatment session and did not return due to reported financial hardship and transportation difficulties. Pt returns today with report of increased leg swelling since recent lumbar kyphoplasty (01/05/19). Pt is not interested in pursuing Complete Decongestive Therapy (CCT)  at this time but would like assistance with compression garment fitting.to help control leg swelling.    Pertinent History  Chronic back pain,  diabetic polyneuropathy, CVI, DM-type 2, lumbar facet  arthropathy, lumbar radiculopathy, lumba spondylosis, polyarthralgia, asthma, glaucoma, chronic proximal bilateral platar fasciitis, current every day smoker, obesity, s/pL5 Kyphoplasty 01/05/19    Limitations  difficulty walking, chronic pain, chronic leg swelling, weakness, impaired balance,    Repetition  Increases Symptoms    Special Tests  + Stemmer    Patient Stated Goals  reduce and manage leg swelling    Currently in Pain?  Yes    Pain Score  8     Pain Location  Back   Hips, buttocks, lower back   Pain Orientation  Right;Left    Pain Descriptors / Indicators  Tiring;Pins and needles;Tingling;Discomfort;Heaviness;Tightness;Pressure;Numbness;Burning;Aching;Tender;Throbbing;Pounding;Shooting;Sore;Spasm;Radiating;Constant;Penetrating;Restless;Stabbing;Sharp    Pain Type  Neuropathic pain;Intractable pain;Chronic pain    Pain Onset  Other (comment)   chronic, progressive   Pain Frequency  Constant    Aggravating Factors   sleeping, standing, walking, sitting, lying down, repositioning,    Pain Relieving Factors  medication,        [facility] OT Assessment - 01/30/19 0001      Assessment   Medical Diagnosis  mild, stage II, BLE lymphedema (LE) 2/2 suspected CVI and obesity    Referring Provider (OT)  Tyson Dense, DPM    Onset Date/Surgical Date  --   s/p ~ 5 yrs coinsiding w/ dx  DM type 2   Prior Therapy  no CDT. No comopression      Precautions  Precautions  Fall    Precaution Comments  DM skin precautions      Restrictions   Weight Bearing Restrictions  No      Balance Screen   Has the patient fallen in the past 6 months  No    Has the patient had a decrease in activity level because of a fear of falling?   Yes    Is the patient reluctant to leave their home because of a fear of falling?   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    Vocation Requirements  sitting, computing    Leisure  family      IADL   Prior Level of Function Shopping  Mod A    Shopping  Needs to be accompanied on any shopping trip    Prior Level of Function Light Housekeeping  Mod A    Light Housekeeping  Needs help with all home maintenance tasks;All laundry must be done by others    Prior Level of Function Meal Prep  Min A    Meal Prep  Needs to have meals prepared and served   seated   Education officer, environmental own vehicle   short distances     Activity Tolerance   Activity Tolerance  Endurance does not limit participation in activity    Activity Tolerance Comments  Activity tolerance limited by chronic pain and leg swelling      Cognition   Overall Cognitive Status  Within Functional Limits for tasks assessed      Observation/Other Assessments   Outcome Measures  comparative limb volumetrics, Lymphedema Life Impact Scale (LLIS)      Posture/Postural Control   Posture/Postural Control  Postural limitations    Postural Limitations  Rounded Shoulders;Forward head;Anterior pelvic tilt;Flexed trunk;Increased thoracic kyphosis;Decreased lumbar lordosis      Sensation   Light Touch  Impaired by gross assessment   diabetic polyneuropathy hands and feet   Additional Comments  unable to bend to inspect feet and distal legs      ROM / Strength   AROM / PROM / Strength  Strength      Hand Function   Right Hand Gross Grasp  Functional    Left Hand Gross Grasp  Functional       Mild, Stage II, BLE Phlebo-lymphedema 2/2 CVI  Skin dry Flaky Erythema  Macerated   mild      Color Redness Present Pallor Blanching Hemosiderin Staining Other   x  x      Odor Malodorous Yeast Fungal infection  Absent      x   Temperature Warm Cool wnl    x    Pitting Edema   1+ 2+ 3+ 4+ Non-pitting        x   Girth Symmetrical Asymmetrical Other Distribution    X Below knees, L>R    Stemmer Sign Positive Negative     bilaterally  Lymphorrea History Of:  Present Absent      x   Wounds History Of Present Absent Venous Arterial Pressure Size   denies            Signs of Infection Redness Warmth Erythema Acute Swelling Drainage Absent        x           Scars   Adhesions Hypersensitivity          Sensation Light Touch Deep pressure Hypersensitivty   Present Impaired Present Impaired Absent Impaired   x x x  x   x  Nails WNL Fungus Other   x    x Hair Growth Symmetrical Asymmetrical   x    Skin Creases Base of toes  Ankles   Base of Fingers Medial Thighs         Abdominal pannus Medial legs     x x                   OT Treatments/Exercises (OP) - 01/30/19 0001      Transfers   Transfers  Sit to Stand;Stand to Sit    Sit to Stand  With upper extremity assist;With armrests;From chair/3-in-1    Stand to Sit  With upper extremity assist;With armrests;To chair/3-in-1      ADLs   Grooming  unable to reach feet to inspect skin, groom nails, perform LB bathing and skin care    UB Dressing  Max A for bra     LB Dressing  Mod A; difficulty fitting street shoes      2/2 feet swelling    Toileting  mod I    Bathing  mod A upper  and lower body    Functional Mobility  2 wheeled walker fin home; transport wc for distances    Cooking  unable to stand for time needed to cook meal    Home Maintenance  Max A    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 Reviewed Rosamaria Lints, Juzo wraps, and Circaid Juxtafit Essesntials.    Person(s) Educated  Patient    Methods  Explanation;Demonstration;Handout;Tactile cues;Verbal cues;Other (comment)   samples available.   Comprehension  Verbalized understanding;Returned demonstration;Need further instruction;Verbal cues required;Tactile cues required          OT Long Term Goals - 01/30/19 1519      OT LONG TERM GOAL #1   Title  Pt will be able to verbalize signs and symptoms of cellulitis infection, and be able to name 4common lymphedema precautions using printed resource for reference (modified independence) to limit LE progression and infection risk.    Baseline  Max A    Time  6    Period  Days    Status  New    Target Date  --   6th LE Rx visit     OT LONG TERM GOAL #2   Title  Pt will be able to independently direct caregive to correctly apply velcro-style compression garments for optimal management of LE swelling over time to limit  progression.    Baseline  Max A    Time  6    Period  Days    Status  New    Target Date  --   6th OT visit     OT LONG TERM GOAL #3   Title  Pt to be able to don and doff BLE, thigh length  sequential device garments using a dressing stick or reacher and extra time (Modified independence) in order to access and use sequential device at will without CG assistance for optimal LE self-management over time.    Baseline  dependent    Time  6    Period  Days    Status  New    Target Date  --   6th OT visit           Plan - 01/30/19 1350    Clinical Impression Statement  Courtney Henderson is a 59 y o female presenting with mild, stage 2, BLE lymphedema venous disease. Contributing factors include frequent dependent positioning throughout the day, decreased LE strength and  decreased activity level due to chromic pain and impaired sensation. Courtney Henderson declines recommended CDT to reduce limb volume and improve tissue integrity to limit LE progression. Instead we will focus on fitting her with alternative, Velcro style, knee length compression wraps for day time use to manage leg swelling and improve functional ambulation. Pt agrees with plan to fit BLE off-the-shelf, CircAid Juxtafit Essentials for ease of donning and doffing. Pt will also benefit from education re DME for LE management, including basic and advanced sequential pneumatic compression devices to be used at home as part of self-care home program. I expect Pt will be more independent with basic thigh length device due to difficulty donning complex multipiece garments. We will explore both in clinic with manufacturer's rep, f possible. Courtney Henderson will benefit from skilled Occupational Therapy for modified CDT for optimal LE management at home with caregiver support over time.  Without skilled OT for CDT LE will progress resulting in further functional decline.    OT Occupational Profile and History  Comprehensive Assessment- Review of records and extensive additional review of physical, cognitive, psychosocial history related to current functional performance    Occupational performance deficits (Please refer to evaluation for details):  ADL's;IADL's;Leisure;Rest and Sleep;Social Participation;Work   role performance, functional ambulation amd Financial controller / Function / Physical Skills  ADL;Edema;ROM;Skin integrity;Decreased knowledge of use of DME;Pain;Strength;Sensation;IADL;Balance;Obesity;Decreased knowledge of precautions;Mobility    Rehab Potential  Good   with caregiver support for donning daytime compression garments   Clinical Decision Making  Multiple treatment options, significant modification of task necessary    Comorbidities Affecting Occupational Performance:  Presence of comorbidities  impacting occupational performance    Comorbidities impacting occupational performance description:  see SUBJECTIVE    Modification or Assistance to Complete Evaluation   Max significant modification of tasks or assist is necessary to complete    OT Frequency  Other (comment)   PT declines CDT. Pt to attend OT PRN to complete garment fitting and sequential compression  device trial . Estimate 6    viisits, or less, including initial eval   completed today.   OT Duration  12 weeks    OT Treatment/Interventions  Self-care/ADL training;Patient/family education;DME and/or AE instruction    Plan  Completed measurements today for knee length, velcro- style, Mediven CircAid Juxtafit  Essentials alternative daytime compression garments. Pt will bring for fitting. OT to send demographics,  insurance  information and eval note  to DME rep at [employer] for benefits review and preauthorization. Will schedule trial with basic Entre device andf/ or Flexitouch depending on insurance.    Recommended Other Services  Pt should be fir with appropriate compression garments/ devices that she is able to don    with modified independence, or alternative velcro wrap garments that she is able to don and doff herself in standing position due to back pain    Consulted and Agree with Plan of Care  Patient       Patient will benefit from skilled therapeutic intervention in order to improve the following deficits and impairments:   Body Structure / Function / Physical Skills: ADL, Edema, ROM, Skin integrity, Decreased knowledge of use of DME, Pain, Strength, Sensation, IADL, Balance, Obesity, Decreased knowledge of precautions, Mobility       Visit Diagnosis: Lymphedema, not elsewhere classified - Plan: Ot plan of care cert/re-cert    Problem List Patient Active Problem List   Diagnosis Date Noted  . Lumbar facet arthropathy 03/17/2018  . Lumbar spondylosis 03/17/2018  . Bilateral lower extremity edema  03/09/2018  . Lumbar radiculopathy 11/09/2017  . Chronic pain syndrome 11/09/2017  . Lumbar foraminal stenosis 11/09/2017  . Lumbosacral spondylosis with radiculopathy 07/13/2017  . Polyarthralgia 07/13/2017  . Diabetic polyneuropathy associated with type 2 diabetes mellitus ([facility]) 07/13/2017    Courtney Henderson 01/30/2019, 3:33 PM  [place] [facility] [address] [place], [place], [address] Phone: [phone]   Fax:  [phone]  Name: Courtney Henderson MRN: [id number] Date of Birth: Oct 09, 1960

## 2019-01-30 NOTE — Patient Instructions (Signed)

## 2019-02-01 ENCOUNTER — Telehealth: Payer: Self-pay | Admitting: Student in an Organized Health Care Education/Training Program

## 2019-02-01 NOTE — Telephone Encounter (Signed)
Patient lvmail asking if her referral to neurosurgery has been completed and sent. She has Physiological scientist. Does this have to have prior auth before sending neurosurgery. Please let paient know status

## 2019-02-06 ENCOUNTER — Ambulatory Visit: Payer: PRIVATE HEALTH INSURANCE | Admitting: Occupational Therapy

## 2019-02-06 ENCOUNTER — Ambulatory Visit: Payer: PRIVATE HEALTH INSURANCE | Admitting: Podiatry

## 2019-02-08 ENCOUNTER — Ambulatory Visit: Payer: PRIVATE HEALTH INSURANCE | Admitting: Student in an Organized Health Care Education/Training Program

## 2019-02-10 ENCOUNTER — Other Ambulatory Visit: Payer: Self-pay

## 2019-02-10 ENCOUNTER — Ambulatory Visit: Payer: PRIVATE HEALTH INSURANCE | Admitting: Occupational Therapy

## 2019-02-10 DIAGNOSIS — I890 Lymphedema, not elsewhere classified: Secondary | ICD-10-CM | POA: Diagnosis not present

## 2019-02-10 NOTE — Therapy (Signed)
[place] MAIN [facility] SERVICES [address] [place], [place], [address] Phone: [phone]   Fax:  [phone]  Occupational Therapy Treatment  Patient Details  Name: Courtney Henderson MRN: [id number] Date of Birth: 09-27-1960 Referring Provider (OT): Tyson Dense, [place]   Encounter Date: 02/10/2019  OT End of Session - 02/10/19 1042    Number of Visits  2    OT Start Time  1000    OT Stop Time  1100    OT Time Calculation (min)  60 min       Past Medical History:  Diagnosis Date  . Complication of anesthesia    long time to wake up, headache  . Diabetes mellitus without complication ([place])   . Lymphedema   . Neuromuscular disorder [facility])     Past Surgical History:  Procedure Laterality Date  . CATARACT EXTRACTION, BILATERAL    . JOINT REPLACEMENT    . KYPHOPLASTY N/A 01/05/2019   Procedure: KYPHOPLASTY L5;  Surgeon: Hessie Knows, MD;  Location: [facility] ORS;  Service: Orthopedics;  Laterality: N/A;  . MULTIPLE TOOTH EXTRACTIONS  06/2018   2-3 weeks ago; 10 teeth  . TUBAL LIGATION      There were no vitals filed for this visit.  Subjective Assessment - 02/10/19 1017    Subjective   Courtney Henderson is here for OT visit 2/ 6 for trial of Flexitouch sequential pneumatic compression device ("pump") to assist with long term self managememnt at home.    Pertinent History  Chronic back pain,  diabetic polyneuropathy, CVI, DM-type 2, lumbar facet arthropathy, lumbar radiculopathy, lumba spondylosis, polyarthralgia, asthma, glaucoma, chronic proximal bilateral platar fasciitis, current every day smoker, obesity, s/pL5 Kyphoplasty 01/05/19    Limitations  difficulty walking, chronic pain, chronic leg swelling, weakness, impaired balance,    Repetition  Increases Symptoms    Special Tests  + Stemmer    Patient Stated Goals  reduce and manage leg swelling    Pain Onset  Other (comment)   chronic, progressive                  OT  Treatments/Exercises (OP) - 02/10/19 0001      ADLs   ADL Education Given  Yes      Manual Therapy   Manual Therapy  Edema management    Edema Management  Flexitouch trial             OT Education - 02/10/19 1031    Education Details  Pt edu for care and use of Flexitouch. Educated re precautions and contraindications, including NOT using pump when DVT or infection is present, or suspected, and not using pump in the event of actute pain. Pt educated for donning and doffing garments.    Person(s) Educated  Patient    Methods  Explanation;Demonstration;Handout;[employer] cues;Verbal cues;Other (comment)   samples available.   Comprehension  Verbalized understanding;Returned demonstration;Need further instruction;Verbal cues required;Tactile cues required          OT Long Term Goals - 02/10/19 1049      OT LONG TERM GOAL #1   Title  Pt will be able to verbalize signs and symptoms of cellulitis infection, and be able to name 4common lymphedema precautions using printed resource for reference (modified independence) to limit LE progression and infection risk.    Baseline  Max A    Time  6    Period  Days    Status  On-going      OT  LONG TERM GOAL #2   Title  Pt will be able to independently direct caregive to correctly apply velcro-style compression garments for optimal management of LE swelling over time to limit progression.    Baseline  Max A 02/10/19: Pt able to don and doff Circaids with modified independence (extra time)    Time  6    Period  Days    Status  Achieved      OT LONG TERM GOAL #3   Title  Pt to be able to don and doff BLE, thigh length  sequential device garments using a dressing stick or reacher and extra time (Modified independence) in order to access and use sequential device at will without CG assistance for optimal LE self-management over time.    Baseline  dependent    Time  6    Period  Days    Status  On-going      OT LONG TERM GOAL #4   Status   Partially Met      OT LONG TERM GOAL #5   Status  Achieved            Plan - 02/10/19 1043    Clinical Impression Statement  Courtney Henderson  completed trial of Flexitouch advanced sequential pneumatic compression device (SPCD in the clinic today under OT supervision. This 32-chamber device is the only available sequential device which facilitates proximal to distal lymphatic fluid return through the deep abdominal lymphatics to the thoracic duct and on to the heart. The basic pneumatic pump is not appropriate for this patient because it does not treat deep abdominal lymphatics essential for returning lymphatic fluid to the heart for re-circulation. Courtney Henderson  tolerated full 1 hr. Fexitouch trial to LLE using 20-30 mmHg compression without increased pain back or leg pain or shortness of breath. Positive indentations below knee observed at end of the treatment session when device was removed indicates fluid and tissue protein were malleable by the device.  Courtney Henderson presents with mild stage II, I89.0 BLE lymphedema. The E0652 Flexitouch pneumatic compression device is medically necessary to treat BLE lymphedema, including activation of abdominal region lymphatics, at home on a daily basis for long term self-management of lower extremity and truncal lymph edema. Pt obtained recommended Velcro style knee length compression garments. She tells me she is able to don and doff garments and is wearing them as recommended. Pt agrees with plan to attend F/U visit w/ OT in 4 weeks. She will call to schedule.    OT Occupational Profile and History  Comprehensive Assessment- Review of records and extensive additional review of physical, cognitive, psychosocial history related to current functional performance    Occupational performance deficits (Please refer to evaluation for details):  ADL's;IADL's;Leisure;Rest and Sleep;Social Participation;Work   role performance, functional ambulation amd Horticulturist, commercial / Function / Physical Skills  ADL;Edema;ROM;Skin integrity;Decreased knowledge of use of DME;Pain;Strength;Sensation;IADL;Balance;Obesity;Decreased knowledge of precautions;Mobility    Rehab Potential  Good   with caregiver support for donning daytime compression garments   Clinical Decision Making  Multiple treatment options, significant modification of task necessary    Comorbidities Affecting Occupational Performance:  Presence of comorbidities impacting occupational performance    Comorbidities impacting occupational performance description:  see SUBJECTIVE    Modification or Assistance to Complete Evaluation   Max significant modification of tasks or assist is necessary to complete    OT Frequency  Other (comment)   PT declines CDT. Pt to attend OT PRN  to complete garment fitting and sequential compression  device trial . Estimate 6    viisits, or less, including initial eval   completed today.   OT Duration  12 weeks    OT Treatment/Interventions  Self-care/ADL training;Patient/family education;DME and/or AE instruction    Plan  Completed measurements today for knee length, velcro- style, Mediven CircAid Juxtafit  Essentials alternative daytime compression garments. Pt will bring for fitting. OT to send demographics,  insurance information and eval note  to DME rep at [employer] for benefits review and preauthorization. Will schedule trial with basic Entre device andf/ or Flexitouch depending on insurance.    Recommended Other Services  Pt should be fir with appropriate compression garments/ devices that she is able to don    with modified independence, or alternative velcro wrap garments that she is able to don and doff herself in standing position due to back pain    Consulted and Agree with Plan of Care  Patient       Patient will benefit from skilled therapeutic intervention in order to improve the following deficits and impairments:   Body Structure / Function / Physical  Skills: ADL, Edema, ROM, Skin integrity, Decreased knowledge of use of DME, Pain, Strength, Sensation, IADL, Balance, Obesity, Decreased knowledge of precautions, Mobility       Visit Diagnosis: Lymphedema, not elsewhere classified    Problem List Patient Active Problem List   Diagnosis Date Noted  . Lumbar facet arthropathy 03/17/2018  . Lumbar spondylosis 03/17/2018  . Bilateral lower extremity edema 03/09/2018  . Lumbar radiculopathy 11/09/2017  . Chronic pain syndrome 11/09/2017  . Lumbar foraminal stenosis 11/09/2017  . Lumbosacral spondylosis with radiculopathy 07/13/2017  . Polyarthralgia 07/13/2017  . Diabetic polyneuropathy associated with type 2 diabetes mellitus ([place]) 07/13/2017    Andrey Spearman, MS, OTR/L, [facility] 02/10/19 10:58 AM  [place] MAIN [facility] SERVICES [address] [place], [place], [address] Phone: [phone]   Fax:  [phone]  Name: STEPHAN DRAUGHN MRN: [id number] Date of Birth: 22-Jan-1960

## 2019-02-13 ENCOUNTER — Encounter: Payer: Self-pay | Admitting: Student in an Organized Health Care Education/Training Program

## 2019-02-14 ENCOUNTER — Ambulatory Visit
Payer: PRIVATE HEALTH INSURANCE | Attending: Student in an Organized Health Care Education/Training Program | Admitting: Student in an Organized Health Care Education/Training Program

## 2019-02-14 ENCOUNTER — Other Ambulatory Visit: Payer: Self-pay

## 2019-02-14 DIAGNOSIS — M79605 Pain in left leg: Secondary | ICD-10-CM | POA: Diagnosis not present

## 2019-02-14 DIAGNOSIS — M5416 Radiculopathy, lumbar region: Secondary | ICD-10-CM

## 2019-02-14 DIAGNOSIS — M47816 Spondylosis without myelopathy or radiculopathy, lumbar region: Secondary | ICD-10-CM | POA: Diagnosis not present

## 2019-02-14 DIAGNOSIS — E1142 Type 2 diabetes mellitus with diabetic polyneuropathy: Secondary | ICD-10-CM | POA: Diagnosis not present

## 2019-02-14 DIAGNOSIS — M48061 Spinal stenosis, lumbar region without neurogenic claudication: Secondary | ICD-10-CM

## 2019-02-14 DIAGNOSIS — M48062 Spinal stenosis, lumbar region with neurogenic claudication: Secondary | ICD-10-CM

## 2019-02-14 MED ORDER — PREDNISONE 20 MG PO TABS: ORAL_TABLET | ORAL | 0 refills | Status: AC

## 2019-02-14 NOTE — Progress Notes (Signed)
Patient: ZARI CLY  Service Category: E/M  Provider: Edward Jolly, MD  DOB: 02-28-1960  DOS: 02/14/2019  Location: Office  MRN: [id number]  Setting: Ambulatory outpatient  Referring Provider: Corky Downs, MD  Type: Established Patient  Specialty: Interventional Pain Management  PCP: Corky Downs, MD  Location: Home  Delivery: TeleHealth     Virtual Encounter - Pain Management PROVIDER NOTE: Information contained herein reflects review and annotations entered in association with encounter. Interpretation of such information and data should be left to medically-trained personnel. Information provided to patient can be located elsewhere in the medical record under "Patient Instructions". Document created using STT-dictation technology, any transcriptional errors that may result from process are unintentional.    Contact & Pharmacy Preferred: [phone] Home: [phone] (home) Mobile: [phone] (mobile) E-mail: No e-mail address on record  [id number] [place], [place] - [address] [place] [address] Phone: [phone] Fax: [phone]   Pre-screening  Ms. Neyer offered "in-person" vs "virtual" encounter. She indicated preferring virtual for this encounter.   Reason COVID-19*  Social distancing based on CDC and AMA recommendations.   I contacted JOLONDA GOMM on 02/14/2019 via telephone.      I clearly identified myself as Edward Jolly, MD. I verified that I was speaking with the correct person using two identifiers (Name: ELLIZABETH DACRUZ, and date of birth: [birth year]).  This visit was completed via telephone due to the restrictions of the COVID-19 pandemic. All issues as above were discussed and addressed but no physical exam was performed. If it was felt that the patient should be evaluated in the office, they were directed there. The patient verbally consented to this visit. Patient was unable to complete an audio/visual visit due to Technical  difficulties and/or Lack of internet. Due to the catastrophic nature of the COVID-19 pandemic, this visit was done through audio contact only.  Location of the patient: home address (see Epic for details)  Location of the provider: office Consent I sought verbal advanced consent from [facility] for virtual visit interactions. I informed Ms. Pabst of possible security and privacy concerns, risks, and limitations associated with providing "not-in-person" medical evaluation and management services. I also informed Ms. Blundell of the availability of "in-person" appointments. Finally, I informed her that there would be a charge for the virtual visit and that she could be  personally, fully or partially, financially responsible for it. Ms. Kitch expressed understanding and agreed to proceed.   Historic Elements   Ms. ARI ENGELBRECHT is a 59 y.o. year old, female patient evaluated today after her last encounter by our practice on 02/01/2019. Ms. Blok  has a past medical history of Complication of anesthesia, Diabetes mellitus without complication (HCC), Lymphedema, and Neuromuscular disorder (HCC). She also  has a past surgical history that includes Multiple tooth extractions (06/2018); Tubal ligation; Cataract extraction, bilateral; Joint replacement; and Kyphoplasty (N/A, 01/05/2019). Ms. Hargraves has a current medication list which includes the following prescription(s): contour next ez monitor, breo ellipta, furosemide, gabapentin, glucose blood, ibuprofen, klor-con 10, latanoprost, metformin, microlet lancets, hydrocodone-acetaminophen, and prednisone. She  reports that she has been smoking cigarettes. She has been smoking about 1.50 packs per day. She has never used smokeless tobacco. She reports previous alcohol use. She reports that she does not use drugs. Ms. Shearman is allergic to amoxicillin; morphine and related; and penicillins.   HPI  Today, she is being contacted for follow-up evaluation    Saw Dr Lovell Sheehan  yesterday with NSG Repeat L-MRI s/p kyphoplasaty per Dr Arnoldo Morale recommendation. Tentative plan for surgery with Dr Arnoldo Morale, likely L4/5 fusion  UDS:  Summary  Date Value Ref Range Status  09/07/2017 FINAL  Final    Comment:    ==================================================================== TOXASSURE COMP DRUG ANALYSIS,UR ==================================================================== Test                             Result       Flag       Units Drug Present and Declared for Prescription Verification   Gabapentin                     PRESENT      EXPECTED   Ibuprofen                      PRESENT      EXPECTED Drug Present not Declared for Prescription Verification   Diphenhydramine                PRESENT      UNEXPECTED ==================================================================== Test                      Result    Flag   Units      Ref Range   Creatinine              130              mg/dL      >=20 ==================================================================== Declared Medications:  The flagging and interpretation on this report are based on the  following declared medications.  Unexpected results may arise from  inaccuracies in the declared medications.  **Note: The testing scope of this panel includes these medications:  Gabapentin (Neurontin)  **Note: The testing scope of this panel does not include small to  moderate amounts of these reported medications:  Ibuprofen (Advil)  **Note: The testing scope of this panel does not include following  reported medications:  Bromfenac (Prolensa)  Fluticasone (Breo)  Furosemide (Lasix)  Magnesium  Metformin  Mometasone  Potassium  Timolol  Vilanterol (Breo)  Vitamin C ==================================================================== For clinical consultation, please call [phone]. ====================================================================     Imaging  DG Lumbar  Spine 2-3 Views CLINICAL DATA:  L5 kyphoplasty.  EXAM: DG C-ARM 1-60 MIN; LUMBAR SPINE - 2-3 VIEW  CONTRAST:  None.  FLUOROSCOPY TIME:  Fluoroscopy Time:  4 minutes 28 seconds  Radiation Exposure Index (if provided by the fluoroscopic device): 149.2 mGy  Number of Acquired Spot Images: 1  COMPARISON:  MRI 12/30/2018.  FINDINGS: Lumbar spine numbered as per prior MRI. L5 vertebroplasty. Stable L4 on L5 anterolisthesis.  IMPRESSION: L5 vertebroplasty.  Electronically Signed   By: Beaver   On: 01/05/2019 12:26 DG C-Arm 1-60 Min CLINICAL DATA:  L5 kyphoplasty.  EXAM: DG C-ARM 1-60 MIN; LUMBAR SPINE - 2-3 VIEW  CONTRAST:  None.  FLUOROSCOPY TIME:  Fluoroscopy Time:  4 minutes 28 seconds  Radiation Exposure Index (if provided by the fluoroscopic device): 149.2 mGy  Number of Acquired Spot Images: 1  COMPARISON:  MRI 12/30/2018.  FINDINGS: Lumbar spine numbered as per prior MRI. L5 vertebroplasty. Stable L4 on L5 anterolisthesis.  IMPRESSION: L5 vertebroplasty.  Electronically Signed   By: Marcello Moores  Register   On: 01/05/2019 12:26  L-MRI CLINICAL DATA:  Compression fracture, bilateral buttocks and hip pain  EXAM: MRI LUMBAR SPINE WITHOUT CONTRAST  TECHNIQUE:  Multiplanar, multisequence MR imaging of the lumbar spine was performed. No intravenous contrast was administered.  COMPARISON:  CT December 27, 2018  FINDINGS: Segmentation: There are 5 non-rib bearing lumbar type vertebral bodies with the last intervertebral disc space labeled as L5-S1.  Alignment: There is a grade 1 anterolisthesis of L4 on L5 measuring 5 mm which dates back to Jun 18, 2017. A minimal retrolisthesis of L2 on L3 and L3 on L4.  Vertebrae: There is a subacute superior compression fracture of the L5 vertebral body with approximately 25% loss in height. There is fluid seen within the inter disc space and between the facet joints at L4-L5. There is buckling of the  posterosuperior cortex of L5. No retropulsion of fragments is seen. There is fatty endplate changes seen at L1-L2. Disc height is noted throughout the lumbar spine with small anterior osteophytes.  Conus medullaris and cauda equina: Conus extends to the L1 level. Conus and cauda equina appear normal.  Paraspinal and other soft tissues: There is edema/fluid seen between the inter spinous ligaments at L3-L4 and L4-L5 likely due to ligamentous sprain. The sacroiliac joints are intact.  Disc levels:  T12-L1: There is a broad-based disc bulge with facet arthrosis which causes mild bilateral neural foraminal narrowing.  L1-L2: There is a broad-based disc bulge which is asymmetric to the right with moderate bilateral neural foraminal narrowing. There is mild central canal narrowing  L2-L3: There is a broad-based disc bulge with facet arthrosis and ligamentum flavum hypertrophy which causes severe bilateral neural foraminal narrowing and mild central canal stenosis.  L3-L4: There is a broad-based disc bulge with facet arthrosis and ligamentum flavum hypertrophy which causes severe left and moderate to severe right neural foraminal narrowing. The central thecal sac measures 6 mm in AP diameter.  L4-L5: There is buckling of the posterosuperior cortex of L5 with disc uncovering and a broad-based disc bulge. There is facet arthrosis and ligamentum flavum hypertrophy. Severe bilateral neural foraminal narrowing is seen. The central thecal sac is effaced measuring 3 mm in AP diameter. There is redundancy of the nerve roots seen at this level.  L5-S1:   No significant canal or neural foraminal narrowing.  IMPRESSION: 1. Subacute compression fracture of the L5 vertebral body with approximately 25% loss in vertebral body height and buckling of the posterosuperior cortex. No retropulsion of fragments is seen. 2. Findings suggestive of ligamentous sprain of the  intraspinous ligaments of L3-L4 and L4-L5. 3. Grade 1 anterolisthesis of L4 on L5 and a minimal retrolisthesis of L2 on L3 and L3 on L4. This is unchanged from prior exam dating back to 2019. 4. Lumbar spine spondylosis most notable at L4-L5 with severe bilateral neural foraminal narrowing and severe central canal stenosis.  Assessment  The primary encounter diagnosis was Lumbar radiculopathy. Diagnoses of Lumbar foraminal stenosis, Lumbar spondylosis, Lower extremity pain, anterior, left, Lumbar facet arthropathy, Diabetic polyneuropathy associated with type 2 diabetes mellitus (HCC), and Spinal stenosis, lumbar region, with neurogenic claudication were also pertinent to this visit.  Plan of Care   I am having Junie Panning R. Breisch start on predniSONE. I am also having her maintain her metFORMIN, ibuprofen, Klor-Con 10, furosemide, Breo Ellipta, CONTOUR NEXT EZ MONITOR, glucose blood, Microlet Lancets, latanoprost, gabapentin, and HYDROcodone-acetaminophen.  Pharmacotherapy (Medications Ordered): Meds ordered this encounter  Medications  . predniSONE (DELTASONE) 20 MG tablet    Sig: Take 3 tablets (60 mg total) by mouth daily with breakfast for 3 days, THEN 2 tablets (40 mg total) daily  with breakfast for 3 days, THEN 1 tablet (20 mg total) daily with breakfast for 3 days.    Dispense:  18 tablet    Refill:  0    Follow-up plan:   Return if symptoms worsen or fail to improve.     s/p 2 diagnostic lumbar facet medial branch nerve blocks at bilateral L3, L4, L5, S1 on 04/06/2018 and 07/18/2018.  These procedures provided her with significant pain relief in regards to her axial low back and buttock pain; greater than 75% for approximately 4 weeks and ongoing from her most recent facet block.  lumbar facet medial branch RFA L3, L4, L5, S1 LEFT- November 30, 2018, RIGHT 12/14/2018.       Recent Visits Date Type Provider Dept  01/24/19 Office Visit Edward Jolly, MD [facility]  12/14/18  Procedure visit Edward Jolly, MD [facility]  11/30/18 Procedure visit Edward Jolly, MD [facility]  Showing recent visits within past 90 days and meeting all other requirements   Today's Visits Date Type Provider Dept  02/14/19 Office Visit Edward Jolly, MD [facility]  Showing today's visits and meeting all other requirements   Future Appointments No visits were found meeting these conditions.  Showing future appointments within next 90 days and meeting all other requirements   I discussed the assessment and treatment plan with the patient. The patient was provided an opportunity to ask questions and all were answered. The patient agreed with the plan and demonstrated an understanding of the instructions.  Patient advised to call back or seek an in-person evaluation if the symptoms or condition worsens.  Duration of encounter: 20 minutes.  Note by: Edward Jolly, MD Date: 02/14/2019; Time: 2:57 PM

## 2019-02-20 ENCOUNTER — Encounter: Payer: Self-pay | Admitting: Podiatry

## 2019-02-20 ENCOUNTER — Other Ambulatory Visit: Payer: Self-pay

## 2019-02-20 ENCOUNTER — Ambulatory Visit (INDEPENDENT_AMBULATORY_CARE_PROVIDER_SITE_OTHER): Payer: PRIVATE HEALTH INSURANCE | Admitting: Podiatry

## 2019-02-20 DIAGNOSIS — M722 Plantar fascial fibromatosis: Secondary | ICD-10-CM

## 2019-02-20 NOTE — Progress Notes (Signed)
She presents today for follow-up of her plantar fasciitis bilaterally.  She states that her radiofrequency ablation of her nerves in her back went wrong and now she is numb across her back and buttocks to the top of her thigh.  States that she then went on to fracture her fifth vertebra which they did a cement filler on that and then she is seeing Dr. Lovell Sheehan who is contemplating working on L4 and 5 of her back.  Objective: Vital signs are stable she is alert and oriented x3.  Insertable lymphedema bilateral lower extremities pulses are strongly palpable no open lesions or wounds she has some serosanguineous drainage from the lymphedema in her legs.  She has pain on palpation medial calcaneal tubercles bilateral.  Assessment: Chronic intractable plantar fasciitis possibly associated with spinal compression and lymphedema.  Lymphedema with no cellulitis.  Plan: Injected the bilateral heels today 10 mg of Kenalog 5 mg of Marcaine with 1/2 cc of Celestone bilateral mid arch injections.  Follow-up with her in 6 weeks

## 2019-02-21 ENCOUNTER — Encounter: Payer: PRIVATE HEALTH INSURANCE | Admitting: Student in an Organized Health Care Education/Training Program

## 2019-02-21 ENCOUNTER — Ambulatory Visit: Payer: PRIVATE HEALTH INSURANCE | Admitting: Student in an Organized Health Care Education/Training Program

## 2019-03-01 ENCOUNTER — Other Ambulatory Visit: Payer: Self-pay | Admitting: Neurosurgery

## 2019-03-01 DIAGNOSIS — M48062 Spinal stenosis, lumbar region with neurogenic claudication: Secondary | ICD-10-CM

## 2019-03-06 ENCOUNTER — Telehealth: Payer: Self-pay

## 2019-03-06 NOTE — Telephone Encounter (Signed)
Called and spoke with Courtney Henderson. Asked her to fax forms for medical records request to me at [phone].

## 2019-03-06 NOTE — Telephone Encounter (Signed)
Taking care of this

## 2019-03-06 NOTE — Telephone Encounter (Signed)
Ladona Ridgel from Medical center called requesting record release from Pt faxed to them.  Fax number: [phone]

## 2019-03-06 NOTE — Telephone Encounter (Signed)
Thank you Jessica! 

## 2019-03-09 ENCOUNTER — Ambulatory Visit
Admission: RE | Admit: 2019-03-09 | Discharge: 2019-03-09 | Disposition: A | Discharge status: Home / Self Care | Payer: PRIVATE HEALTH INSURANCE | Source: Ambulatory Visit | Attending: Neurosurgery | Admitting: Neurosurgery

## 2019-03-09 ENCOUNTER — Other Ambulatory Visit: Payer: Self-pay

## 2019-03-09 DIAGNOSIS — M48062 Spinal stenosis, lumbar region with neurogenic claudication: Secondary | ICD-10-CM

## 2019-03-09 LAB — POCT I-STAT CREATININE: Creatinine, Ser: 0.5 mg/dL (ref 0.44–1.00)

## 2019-03-09 IMAGING — MR MR LUMBAR SPINE WO/W CM
6 of 7 series · 30 of 48 positions shown · IV contrast (gadavist)
Comparison: MRI of the lumbar spine [DATE]

CLINICAL DATA: Low back pain with bilateral hip and leg pain.

EXAM:
MRI LUMBAR SPINE WITHOUT AND WITH CONTRAST
TECHNIQUE: Multiplanar and multiecho pulse sequences of the lumbar spine were
obtained without and with intravenous contrast.
CONTRAST:  10mL GADAVIST GADOBUTROL 1 MMOL/ML IV SOLN

[Series 5: T2 · sagittal · 4.0mm · 0.81mm/px · 4 of 17 slices shown (1 of 2)]
[im 1/17]
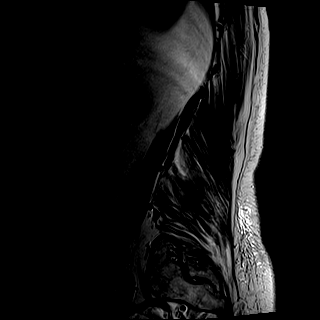
[im 6/17]
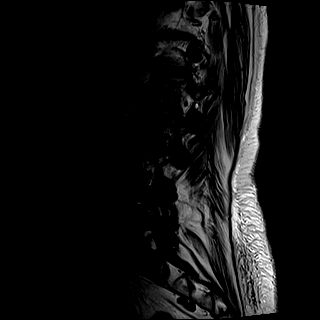
[im 11/17]
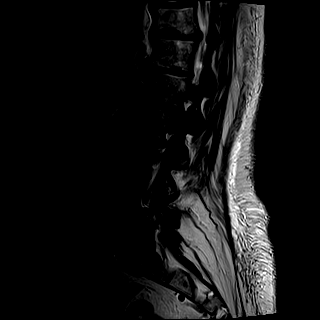
[im 17/17]
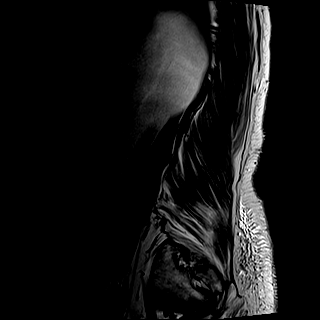

[Series 6: T1 · sagittal · 4.0mm · 0.81mm/px · 4 of 17 slices shown (1 of 2)]
[im 1/17]
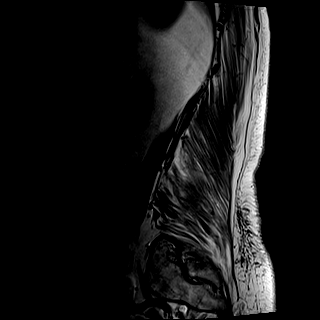
[im 6/17]
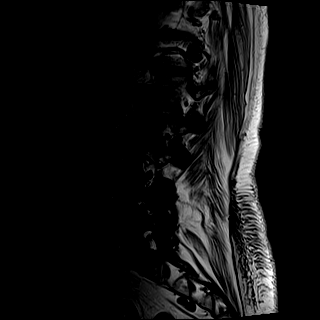
[im 11/17]
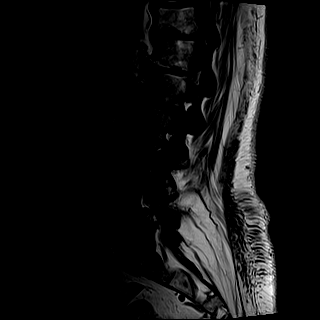
[im 17/17]
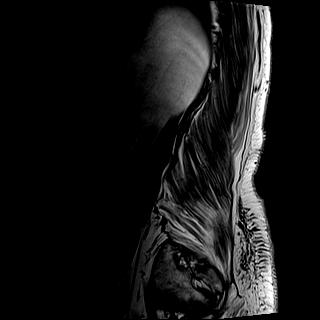

[Series 7: STIR · sagittal · 4.0mm · 0.41mm/px · 1 of 17 slices shown]
[im 1/17]
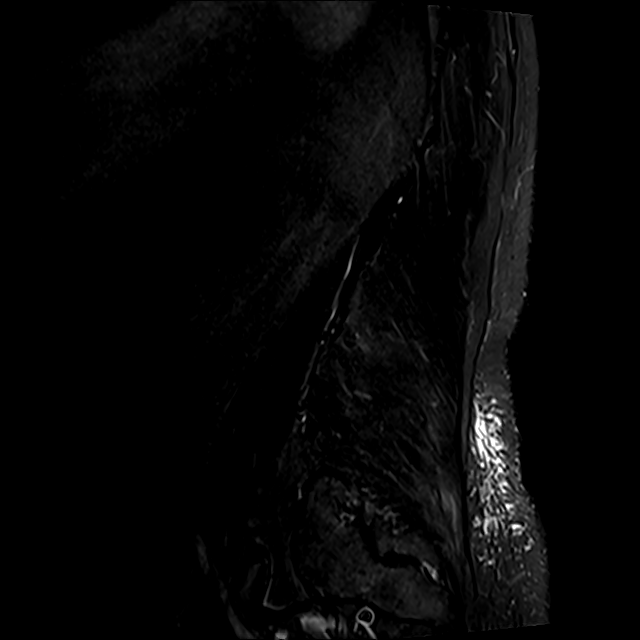

[Series 8: T2 · axial · 4.0mm · 0.78mm/px · z∈[-61,+130]mm · 8 of 34 slices shown (2 of 2)]
[im 1/34]
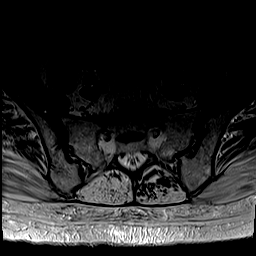
[im 4/34]
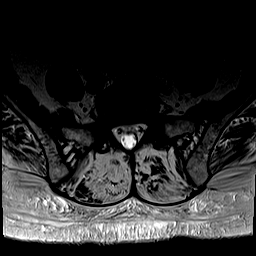
[im 12/34]
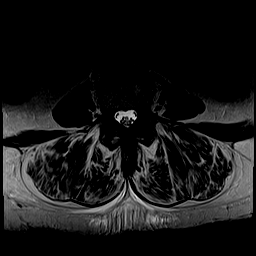
[im 15/34]
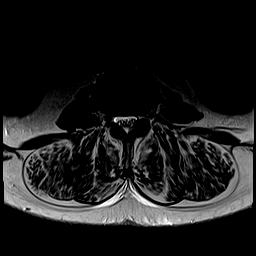
[im 19/34]
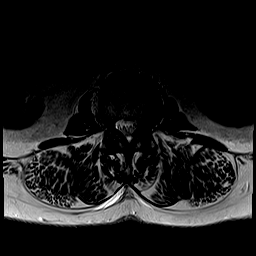
[im 23/34]
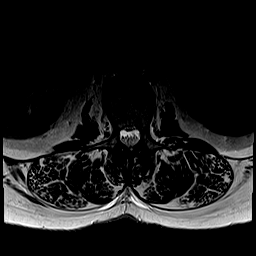
[im 30/34]
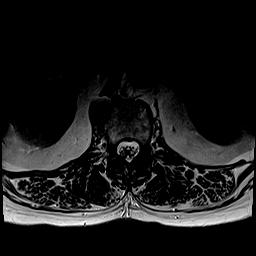
[im 34/34]
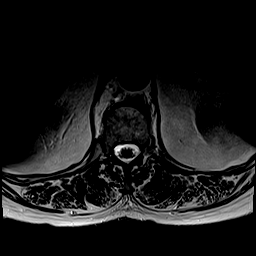

[Series 9: T1 · axial · 4.0mm · 0.39mm/px · z∈[-61,+130]mm · 8 of 34 slices shown (2 of 2)]
[im 1/34]
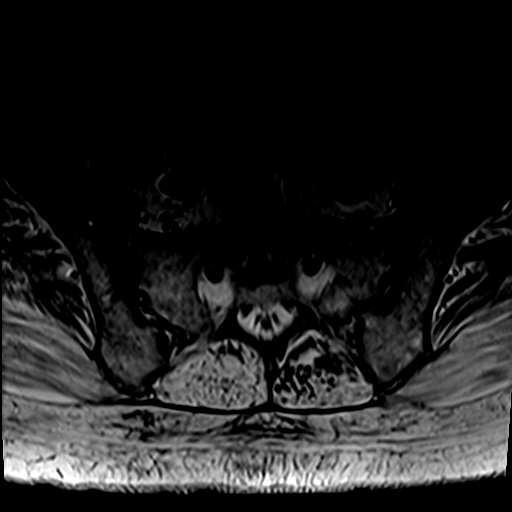
[im 4/34]
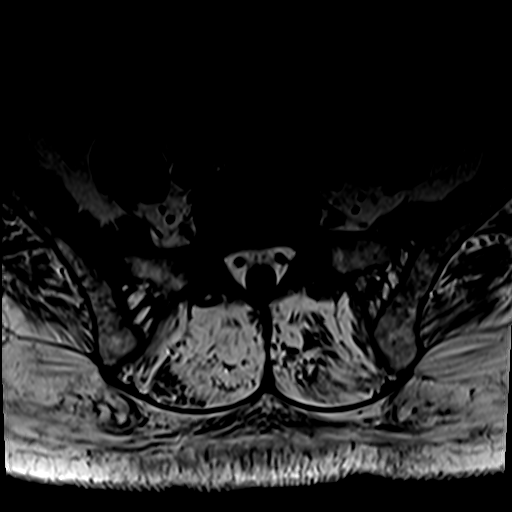
[im 12/34]
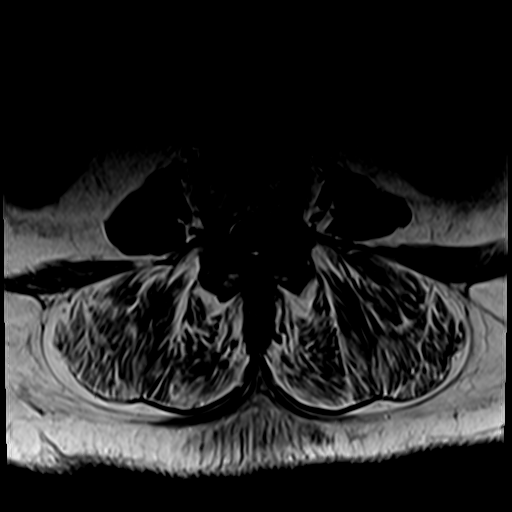
[im 15/34]
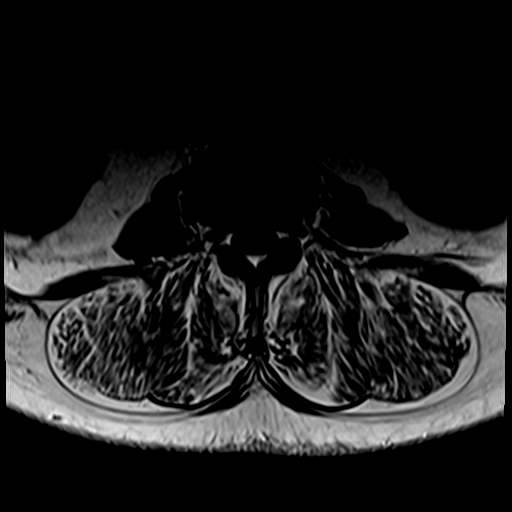
[im 19/34]
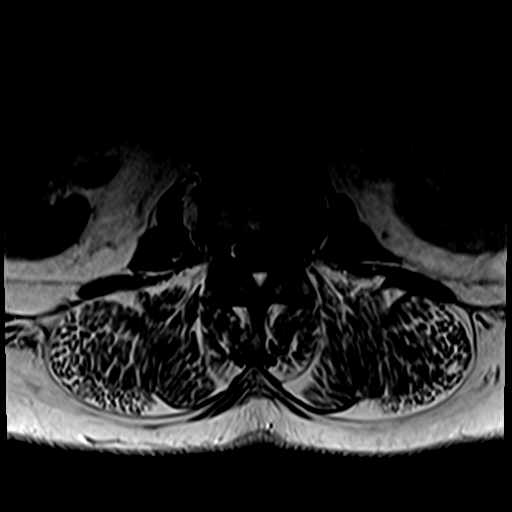
[im 23/34]
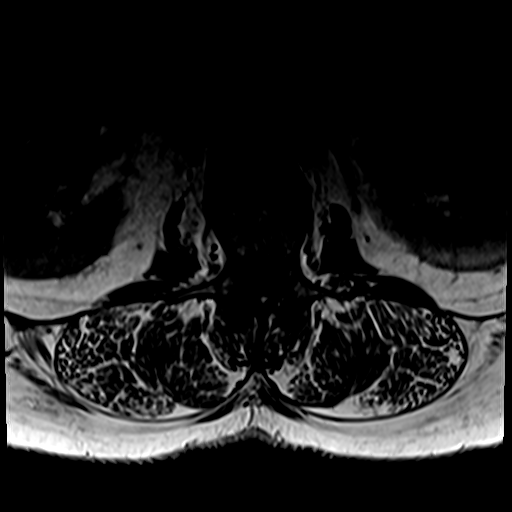
[im 30/34]
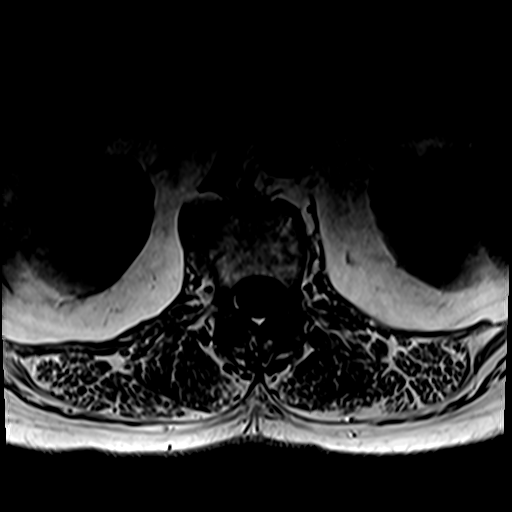
[im 34/34]
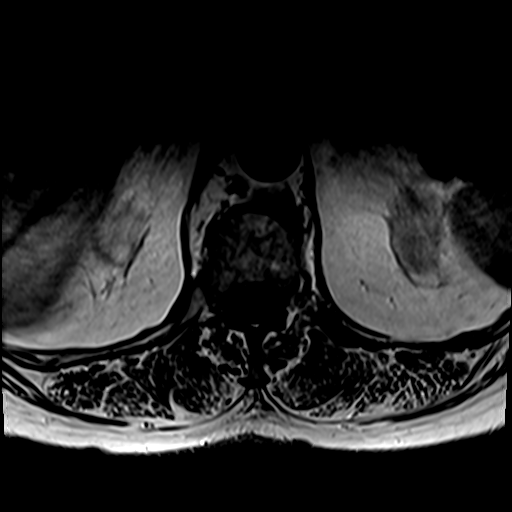

[Series 10: T1 fat-sat post-contrast · sagittal · 4.0mm · 0.81mm/px · 5 of 17 slices shown]
[im 1/17]
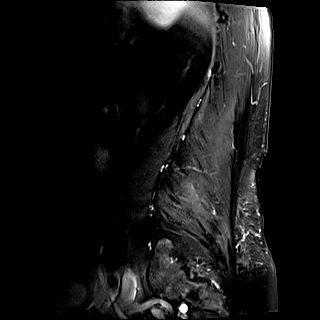
[im 5/17]
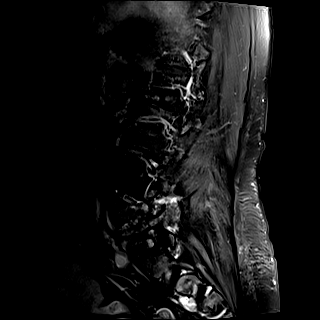
[im 9/17]
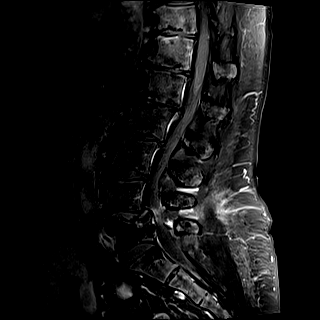
[im 13/17]
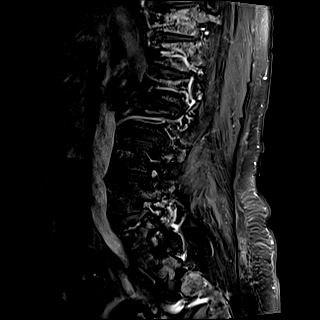
[im 17/17]
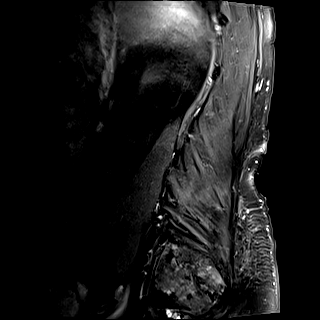

[30 of 48 positions shown; findings below may reference images not displayed]

FINDINGS: Segmentation:  Standard.

Alignment: There is a grade 1 anterolisthesis of L4 over L5 related
to facet degeneration, unchanged from prior. Small retrolisthesis of
L1 over L2, L2 over L3 are again noted.

Vertebrae: Status post kyphoplasty of L5 with small amount of cement
within the L4-5 disc space. Degenerative endplate changes are noted
from T11-12 through L2-3. No evidence of discitis/osteomyelitis are
suspicious bone lesion.

Conus medullaris and cauda equina: Conus extends to the L1 level.
Conus and cauda equina appear normal.

Paraspinal and other soft tissues: Atrophy of the paraspinal
musculature is noted, particularly in the lumbosacral region.

Disc levels:

T11-T12: Posterior disc protrusion causing effacement of the
anterior CSF space and resulting in mild spinal canal stenosis.
There is no neural foraminal narrowing.

T12-L1: Disc bulge, facet degenerative changes and ligamentum flavum
redundancy without significant spinal canal or neural foraminal
stenosis.

L1-L2: Disc bulge, facet degenerative changes and ligamentum flavum
redundancy resulting in mild spinal canal stenosis, moderate right
and mild left neural foraminal narrowing.

L2-3: Disc bulge, facet degenerative changes and ligamentum flavum
redundancy resulting in mild spinal canal stenosis and moderate
bilateral neural foraminal narrowing.

L3-4: Left asymmetric disc bulge, facet degenerative changes and
ligamentum flavum redundancy resulting in mild spinal canal
stenosis, narrowing of the left subarticular zone and moderate left
neural foraminal narrowing.

L4-5: Disc bulge/uncovering of the disc, prominent facet
degenerative changes and ligamentum flavum hypertrophy resulting in
severe spinal canal stenosis and severe bilateral neural foraminal
narrowing.

L5-S1: Shallow disc bulge and facet degenerative changes without
significant spinal canal or neural foraminal stenosis.

Edema and contrast enhancement is noted in the inter spinous
ligament at L3-4 and L4-5.

No significant change in spinal canal or neural foraminal stenosis
when compared to prior study.
IMPRESSION: 1. Overall unchanged multilevel degenerative changes of the lumbar
spine, worst at L4-L5 where there is grade 1 anterolisthesis, severe
spinal canal stenosis, and severe bilateral neural foraminal
narrowing.
2. Edema and contrast enhancement in the inter spinous ligament at
L3-4 and L4-5.

## 2019-03-09 MED ORDER — GADOBUTROL 1 MMOL/ML IV SOLN
10.0000 mL | Freq: Once | INTRAVENOUS | Status: AC | PRN
  Administered 2019-03-09: 10 mL via INTRAVENOUS

## 2019-03-13 ENCOUNTER — Other Ambulatory Visit: Payer: Self-pay | Admitting: Neurosurgery

## 2019-03-15 NOTE — Progress Notes (Signed)
[facility] [address] [place] - [address] [place] [address] Phone: [phone] Fax: [phone]      Your procedure is scheduled on March 22, 2019.  Report to [facility] Main Entrance "A" at 6:30 A.M., and check in at the Admitting office.  Call this number if you have problems the morning of surgery:  [phone]  Call [phone] if you have any questions prior to your surgery date Monday-Friday 8am-4pm    Remember:  Do not eat or drink after midnight the night before your surgery    Take these medicines the morning of surgery with A SIP OF WATER: BREO ELLIPTA gabapentin (NEURONTIN) predniSONE (DELTASONE)  HYDROcodone-acetaminophen (NORCO) - if needed  7 days prior to surgery STOP taking any Aspirin (unless otherwise instructed by your surgeon), Aleve, Naproxen, Ibuprofen, Motrin, Advil, Goody's, BC's, all herbal medications, fish oil, and all vitamins.    The Morning of Surgery  Do not wear jewelry, make-up or nail polish.  Do not wear lotions, powders, or perfumes or deodorant  Do not shave 48 hours prior to surgery.    Do not bring valuables to the hospital.  [facility] is not responsible for any belongings or valuables.  If you are a smoker, DO NOT Smoke 24 hours prior to surgery  If you wear a CPAP at night please bring your mask the morning of surgery   Remember that you must have someone to transport you home after your surgery, and remain with you for 24 hours if you are discharged the same day.   Please bring cases for contacts, glasses, hearing aids, dentures or bridgework because it cannot be worn into surgery.    Leave your suitcase in the car.  After surgery it may be brought to your room.  For patients admitted to the hospital, discharge time will be determined by your treatment team.  Patients discharged the day of surgery will not be allowed to drive home.    Special instructions:   [facility]- Preparing For  Surgery  Before surgery, you can play an important role. Because skin is not sterile, your skin needs to be as free of germs as possible. You can reduce the number of germs on your skin by washing with CHG (chlorahexidine gluconate) Soap before surgery.  CHG is an antiseptic cleaner which kills germs and bonds with the skin to continue killing germs even after washing.    Oral Hygiene is also important to reduce your risk of infection.  Remember - BRUSH YOUR TEETH THE MORNING OF SURGERY WITH YOUR REGULAR TOOTHPASTE  Please do not use if you have an allergy to CHG or antibacterial soaps. If your skin becomes reddened/irritated stop using the CHG.  Do not shave (including legs and underarms) for at least 48 hours prior to first CHG shower. It is OK to shave your face.  Please follow these instructions carefully.   1. Shower the NIGHT BEFORE SURGERY and the MORNING OF SURGERY with CHG Soap.   2. If you chose to wash your hair, wash your hair first as usual with your normal shampoo.  3. After you shampoo, rinse your hair and body thoroughly to remove the shampoo.  4. Use CHG as you would any other liquid soap. You can apply CHG directly to the skin and wash gently with a scrungie or a clean washcloth.   5. Apply the CHG Soap to your body ONLY FROM THE NECK DOWN.  Do not  use on open wounds or open sores. Avoid contact with your eyes, ears, mouth and genitals (private parts). Wash Face and genitals (private parts)  with your normal soap.   6. Wash thoroughly, paying special attention to the area where your surgery will be performed.  7. Thoroughly rinse your body with warm water from the neck down.  8. DO NOT shower/wash with your normal soap after using and rinsing off the CHG Soap.  9. Pat yourself dry with a CLEAN TOWEL.  10. Wear CLEAN PAJAMAS to bed the night before surgery, wear comfortable clothes the morning of surgery  11. Place CLEAN SHEETS on your bed the night of your first  shower and DO NOT SLEEP WITH PETS.    Day of Surgery:  Please shower the morning of surgery with the CHG soap Do not apply any deodorants/lotions. Please wear clean clothes to the hospital/surgery center.   Remember to brush your teeth WITH YOUR REGULAR TOOTHPASTE.   Please read over the following fact sheets that you were given.

## 2019-03-16 ENCOUNTER — Other Ambulatory Visit: Payer: Self-pay

## 2019-03-16 ENCOUNTER — Encounter (HOSPITAL_COMMUNITY): Payer: Self-pay

## 2019-03-16 ENCOUNTER — Encounter (HOSPITAL_COMMUNITY)
Admission: RE | Admit: 2019-03-16 | Discharge: 2019-03-16 | Disposition: A | Discharge status: Home / Self Care | Payer: PRIVATE HEALTH INSURANCE | Source: Ambulatory Visit | Attending: Neurosurgery | Admitting: Neurosurgery

## 2019-03-16 DIAGNOSIS — Z01818 Encounter for other preprocedural examination: Secondary | ICD-10-CM | POA: Diagnosis not present

## 2019-03-16 DIAGNOSIS — Z79899 Other long term (current) drug therapy: Secondary | ICD-10-CM | POA: Insufficient documentation

## 2019-03-16 DIAGNOSIS — F17210 Nicotine dependence, cigarettes, uncomplicated: Secondary | ICD-10-CM | POA: Insufficient documentation

## 2019-03-16 DIAGNOSIS — M4316 Spondylolisthesis, lumbar region: Secondary | ICD-10-CM | POA: Insufficient documentation

## 2019-03-16 DIAGNOSIS — Z7984 Long term (current) use of oral hypoglycemic drugs: Secondary | ICD-10-CM | POA: Diagnosis not present

## 2019-03-16 DIAGNOSIS — Z01812 Encounter for preprocedural laboratory examination: Secondary | ICD-10-CM | POA: Diagnosis not present

## 2019-03-16 DIAGNOSIS — E118 Type 2 diabetes mellitus with unspecified complications: Secondary | ICD-10-CM | POA: Diagnosis not present

## 2019-03-16 DIAGNOSIS — Z01810 Encounter for preprocedural cardiovascular examination: Secondary | ICD-10-CM | POA: Diagnosis not present

## 2019-03-16 LAB — BASIC METABOLIC PANEL
Anion gap: 15 (ref 5–15)
BUN: 10 mg/dL (ref 6–20)
CO2: 23 mmol/L (ref 22–32)
Calcium: 9.2 mg/dL (ref 8.9–10.3)
Chloride: 100 mmol/L (ref 98–111)
Creatinine, Ser: 0.61 mg/dL (ref 0.44–1.00)
GFR calc Af Amer: >60 mL/min (ref >60)
GFR calc non Af Amer: >60 mL/min (ref >60)
Glucose, Bld: 311 mg/dL — ABNORMAL HIGH (ref 70–99)
Potassium: 4.1 mmol/L (ref 3.5–5.1)
Sodium: 138 mmol/L (ref 135–145)

## 2019-03-16 LAB — CBC
HCT: 44.6 % (ref 36.0–46.0)
Hemoglobin: 15.3 g/dL — ABNORMAL HIGH (ref 12.0–15.0)
MCH: 35.3 pg — ABNORMAL HIGH (ref 26.0–34.0)
MCHC: 34.3 g/dL (ref 30.0–36.0)
MCV: 102.8 fL — ABNORMAL HIGH (ref 80.0–100.0)
Platelets: 237 10*3/uL (ref 150–400)
RBC: 4.34 MIL/uL (ref 3.87–5.11)
RDW: 14.8 % (ref 11.5–15.5)
WBC: 16.4 10*3/uL — ABNORMAL HIGH (ref 4.0–10.5)
nRBC: 0 % (ref 0.0–0.2)

## 2019-03-16 LAB — HEMOGLOBIN A1C
Hgb A1c MFr Bld: 6.7 % — ABNORMAL HIGH (ref 4.8–5.6)
Mean Plasma Glucose: 145.59 mg/dL

## 2019-03-16 LAB — GLUCOSE, CAPILLARY: Glucose-Capillary: 329 mg/dL — ABNORMAL HIGH (ref 70–99)

## 2019-03-16 LAB — SURGICAL PCR SCREEN
MRSA, PCR: NEGATIVE
Staphylococcus aureus: POSITIVE — AB

## 2019-03-16 NOTE — Progress Notes (Signed)
[id number] - [place], [place] - [address] [place] [address] Phone: [phone] Fax: [phone]      Your procedure is scheduled on March 22, 2019.  Report to [facility] Main Entrance "A" at 6:30 A.M., and check in at the Admitting office.  Call this number if you have problems the morning of surgery:  [phone]  Call [phone] if you have any questions prior to your surgery date Monday-Friday 8am-4pm    Remember:  Do not eat or drink after midnight the night before your surgery    Take these medicines the morning of surgery with A SIP OF WATER: BREO ELLIPTA gabapentin (NEURONTIN) predniSONE (DELTASONE)  HYDROcodone-acetaminophen ([place]) - if needed  7 days prior to surgery STOP taking any Aspirin (unless otherwise instructed by your surgeon), Aleve, Naproxen, Ibuprofen, Motrin, Advil, Goody's, BC's, all herbal medications, fish oil, and all vitamins.                                         How to Manage Your Diabetes Before and After Surgery  Why is it important to control my blood sugar before and after surgery? . Improving blood sugar levels before and after surgery helps healing and can limit problems. . A way of improving blood sugar control is eating a healthy diet by: o  Eating less sugar and carbohydrates o  Increasing activity/exercise o  Talking with your doctor about reaching your blood sugar goals . High blood sugars (greater than 180 mg/dL) can raise your risk of infections and slow your recovery, so you will need to focus on controlling your diabetes during the weeks before surgery. . Make sure that the doctor who takes care of your diabetes knows about your planned surgery including the date and location.  How do I manage my blood sugar before surgery? . Check your blood sugar at least 4 times a day, starting 2 days before surgery, to make sure that the level is not too high or low. o Check your blood sugar the  morning of your surgery when you wake up and every 2 hours until you get to the Short Stay unit. . If your blood sugar is less than 70 mg/dL, you will need to treat for low blood sugar: o Do not take insulin. o Treat a low blood sugar (less than 70 mg/dL) with  cup of clear juice (cranberry or apple), 4 glucose tablets, OR glucose gel. Recheck blood sugar in 15 minutes after treatment (to make sure it is greater than 70 mg/dL). If your blood sugar is not greater than 70 mg/dL on recheck, call [phone] o  for further instructions. . Report your blood sugar to the short stay nurse when you get to Short Stay.  . If you are admitted to the hospital after surgery: o Your blood sugar will be checked by the staff and you will probably be given insulin after surgery (instead of oral diabetes medicines) to make sure you have good blood sugar levels. o The goal for blood sugar control after surgery is 80-180 mg/dL.        WHAT DO I DO ABOUT MY DIABETES MEDICATION?   Marland Kitchen Do not take oral diabetes medicines (pills) the morning of surgery.  Metformin (glucophage)     The Morning of Surgery  Do not wear jewelry, make-up or nail polish.  Do  not wear lotions, powders, or perfumes or deodorant  Do not shave 48 hours prior to surgery.    Do not bring valuables to the hospital.  [facility] is not responsible for any belongings or valuables.  If you are a smoker, DO NOT Smoke 24 hours prior to surgery  If you wear a CPAP at night please bring your mask the morning of surgery   Remember that you must have someone to transport you home after your surgery, and remain with you for 24 hours if you are discharged the same day.   Please bring cases for contacts, glasses, hearing aids, dentures or bridgework because it cannot be worn into surgery.    Leave your suitcase in the car.  After surgery it may be brought to your room.  For patients admitted to the hospital, discharge time will be  determined by your treatment team.  Patients discharged the day of surgery will not be allowed to drive home.    Special instructions:   [facility]- Preparing For Surgery  Before surgery, you can play an important role. Because skin is not sterile, your skin needs to be as free of germs as possible. You can reduce the number of germs on your skin by washing with CHG (chlorahexidine gluconate) Soap before surgery.  CHG is an antiseptic cleaner which kills germs and bonds with the skin to continue killing germs even after washing.    Oral Hygiene is also important to reduce your risk of infection.  Remember - BRUSH YOUR TEETH THE MORNING OF SURGERY WITH YOUR REGULAR TOOTHPASTE  Please do not use if you have an allergy to CHG or antibacterial soaps. If your skin becomes reddened/irritated stop using the CHG.  Do not shave (including legs and underarms) for at least 48 hours prior to first CHG shower. It is OK to shave your face.  Please follow these instructions carefully.   1. Shower the NIGHT BEFORE SURGERY and the MORNING OF SURGERY with CHG Soap.   2. If you chose to wash your hair, wash your hair first as usual with your normal shampoo.  3. After you shampoo, rinse your hair and body thoroughly to remove the shampoo.  4. Use CHG as you would any other liquid soap. You can apply CHG directly to the skin and wash gently with a scrungie or a clean washcloth.   5. Apply the CHG Soap to your body ONLY FROM THE NECK DOWN.  Do not use on open wounds or open sores. Avoid contact with your eyes, ears, mouth and genitals (private parts). Wash Face and genitals (private parts)  with your normal soap.   6. Wash thoroughly, paying special attention to the area where your surgery will be performed.  7. Thoroughly rinse your body with warm water from the neck down.  8. DO NOT shower/wash with your normal soap after using and rinsing off the CHG Soap.  9. Pat yourself dry with a CLEAN  TOWEL.  10. Wear CLEAN PAJAMAS to bed the night before surgery, wear comfortable clothes the morning of surgery  11. Place CLEAN SHEETS on your bed the night of your first shower and DO NOT SLEEP WITH PETS.    Day of Surgery:  Please shower the morning of surgery with the CHG soap Do not apply any deodorants/lotions. Please wear clean clothes to the hospital/surgery center.   Remember to brush your teeth WITH YOUR REGULAR TOOTHPASTE.   Please read over the following fact sheets that  you were given.

## 2019-03-16 NOTE — Progress Notes (Signed)
PCP - Marguerita Merles [facility] Cardiologist - na    Chest x-ray - na EKG - 03/16/19 Stress Test - na ECHO - na Cardiac Cath - na  Sleep Study - na CPAP -   Fasting Blood Sugar - not sure-doesn't check.  Checks Blood Sugar every other day  Usually 150-190  Blood Thinner Instructions:na Aspirin Instructions:    COVID TEST- 03/20/19   Anesthesia review: blood sugar, pt. Reports she drank Mtn. Dew on the way over.  Patient denies shortness of breath, fever, cough and chest pain at PAT appointment   All instructions explained to the patient, with a verbal understanding of the material. Patient agrees to go over the instructions while at home for a better understanding. Patient also instructed to self quarantine after being tested for COVID-19. The opportunity to ask questions was provided.

## 2019-03-17 LAB — TYPE AND SCREEN
ABO/RH(D): O NEG
Antibody Screen: NEGATIVE
Weak D: POSITIVE

## 2019-03-17 NOTE — Progress Notes (Signed)
Lab tech from the Blood Bank called and said that this patient had a discrepancy with her blood type (different from what [facility] had done). She will need a repeat Type and Screen on day of surgery.

## 2019-03-17 NOTE — Anesthesia Preprocedure Evaluation (Addendum)
Anesthesia Evaluation  Patient identified by MRN, date of birth, ID band Patient awake    Reviewed: Allergy & Precautions, NPO status , Patient's Chart, lab work & pertinent test results  History of Anesthesia Complications Negative for: history of anesthetic complications  Airway Mallampati: II  TM Distance: >3 FB Neck ROM: Full    Dental  (+) Dental Advisory Given   Pulmonary Current SmokerPatient did not abstain from smoking.,  03/20/2019 SARS coronavirus NEG   breath sounds clear to auscultation       Cardiovascular (-) hypertension(-) anginanegative cardio ROS   Rhythm:Regular Rate:Normal     Neuro/Psych negative neurological ROS     GI/Hepatic negative GI ROS, Neg liver ROS,   Endo/Other  diabetes, Oral Hypoglycemic AgentsMorbid obesity  Renal/GU negative Renal ROS     Musculoskeletal  (+) Arthritis ,   Abdominal (+) + obese,   Peds  Hematology negative hematology ROS (+)   Anesthesia Other Findings   Reproductive/Obstetrics S/p BTL                           Anesthesia Physical Anesthesia Plan  ASA: III  Anesthesia Plan: General   Post-op Pain Management:    Induction: Intravenous  PONV Risk Score and Plan: 3 and Ondansetron, Dexamethasone and Scopolamine patch - Pre-op  Airway Management Planned: Oral ETT  Additional Equipment:   Intra-op Plan:   Post-operative Plan: Extubation in OR  Informed Consent: I have reviewed the patients History and Physical, chart, labs and discussed the procedure including the risks, benefits and alternatives for the proposed anesthesia with the patient or authorized representative who has indicated his/her understanding and acceptance.     Dental advisory given  Plan Discussed with: CRNA and Surgeon  Anesthesia Plan Comments: (Non insulin dependent DMII. Elevated BG at PAT, 329 on CBG. Discussed with pt. She admits to drinking a lot  of regular soda and sweet tea lately. Advised her that uncontrolled BG could be cause for DOS cancellation and also puts her at higher risk for periop complications. She verbalized understanding and will monitor her BG more closely. A1c 6.7 indicates much better long term control.  Preop labs also notable for mildly elevated WBC at 16.4. Likely due in part to recent prednisone use as prescribed by pain management for LBP.  Abnormal labs were called to Dr. Lovell Sheehan office.   EKG 03/16/19: NSR. Rate 97.)      Anesthesia Quick Evaluation

## 2019-03-17 NOTE — Progress Notes (Signed)
Anesthesia Chart Review:   Non insulin dependent DMII. Elevated BG at PAT, 329 on CBG. Discussed with pt. She admits to drinking a lot of regular soda and sweet tea lately. Advised her that uncontrolled BG could be cause for DOS cancellation and also puts her at higher risk for periop complications. She verbalized understanding and will monitor her BG more closely. A1c 6.7 indicates much better long term control.  Preop labs also notable for mildly elevated WBC at 16.4. Likely due in part to recent prednisone use as prescribed by pain management for LBP.  Abnormal labs were called to Dr. Lovell Sheehan office.   EKG 03/16/19: NSR. Rate 97.   Zannie Cove [facility] Short Stay Center/Anesthesiology Phone [phone] 03/17/2019 9:16 AM

## 2019-03-20 ENCOUNTER — Other Ambulatory Visit: Payer: Self-pay

## 2019-03-20 ENCOUNTER — Other Ambulatory Visit
Admission: RE | Admit: 2019-03-20 | Discharge: 2019-03-20 | Disposition: A | Discharge status: Home / Self Care | Payer: PRIVATE HEALTH INSURANCE | Source: Ambulatory Visit | Attending: Neurosurgery | Admitting: Neurosurgery

## 2019-03-20 DIAGNOSIS — Z20822 Contact with and (suspected) exposure to covid-19: Secondary | ICD-10-CM | POA: Diagnosis not present

## 2019-03-20 DIAGNOSIS — Z01812 Encounter for preprocedural laboratory examination: Secondary | ICD-10-CM | POA: Insufficient documentation

## 2019-03-20 LAB — SARS CORONAVIRUS 2 (TAT 6-24 HRS): SARS Coronavirus 2: NEGATIVE

## 2019-03-21 ENCOUNTER — Other Ambulatory Visit: Payer: Self-pay | Admitting: Neurosurgery

## 2019-03-22 ENCOUNTER — Inpatient Hospital Stay (HOSPITAL_COMMUNITY): Payer: PRIVATE HEALTH INSURANCE | Admitting: Anesthesiology

## 2019-03-22 ENCOUNTER — Inpatient Hospital Stay (HOSPITAL_COMMUNITY): Payer: PRIVATE HEALTH INSURANCE

## 2019-03-22 ENCOUNTER — Encounter (HOSPITAL_COMMUNITY): Payer: Self-pay | Admitting: Neurosurgery

## 2019-03-22 ENCOUNTER — Other Ambulatory Visit: Payer: Self-pay

## 2019-03-22 ENCOUNTER — Inpatient Hospital Stay (HOSPITAL_COMMUNITY)
Admission: RE | Disposition: A | Discharge status: Home / Self Care | Payer: Self-pay | Source: Home / Self Care | Attending: Neurosurgery

## 2019-03-22 ENCOUNTER — Inpatient Hospital Stay (HOSPITAL_COMMUNITY): Payer: PRIVATE HEALTH INSURANCE | Admitting: Physician Assistant

## 2019-03-22 ENCOUNTER — Inpatient Hospital Stay (HOSPITAL_COMMUNITY)
Admission: RE | Admit: 2019-03-22 | Discharge: 2019-03-23 | DRG: 454 | Disposition: A | Discharge status: Home / Self Care | Payer: PRIVATE HEALTH INSURANCE | Attending: Neurosurgery | Admitting: Neurosurgery

## 2019-03-22 DIAGNOSIS — Z880 Allergy status to penicillin: Secondary | ICD-10-CM | POA: Diagnosis not present

## 2019-03-22 DIAGNOSIS — Z7952 Long term (current) use of systemic steroids: Secondary | ICD-10-CM

## 2019-03-22 DIAGNOSIS — M4316 Spondylolisthesis, lumbar region: Secondary | ICD-10-CM | POA: Diagnosis present

## 2019-03-22 DIAGNOSIS — I890 Lymphedema, not elsewhere classified: Secondary | ICD-10-CM | POA: Diagnosis present

## 2019-03-22 DIAGNOSIS — F17210 Nicotine dependence, cigarettes, uncomplicated: Secondary | ICD-10-CM | POA: Diagnosis present

## 2019-03-22 DIAGNOSIS — Z7951 Long term (current) use of inhaled steroids: Secondary | ICD-10-CM

## 2019-03-22 DIAGNOSIS — E669 Obesity, unspecified: Secondary | ICD-10-CM | POA: Diagnosis present

## 2019-03-22 DIAGNOSIS — M48062 Spinal stenosis, lumbar region with neurogenic claudication: Principal | ICD-10-CM | POA: Diagnosis present

## 2019-03-22 DIAGNOSIS — Z6841 Body Mass Index (BMI) 40.0 and over, adult: Secondary | ICD-10-CM | POA: Diagnosis not present

## 2019-03-22 DIAGNOSIS — Z79899 Other long term (current) drug therapy: Secondary | ICD-10-CM | POA: Diagnosis not present

## 2019-03-22 DIAGNOSIS — Z886 Allergy status to analgesic agent status: Secondary | ICD-10-CM | POA: Diagnosis not present

## 2019-03-22 DIAGNOSIS — M5116 Intervertebral disc disorders with radiculopathy, lumbar region: Secondary | ICD-10-CM | POA: Diagnosis present

## 2019-03-22 DIAGNOSIS — E119 Type 2 diabetes mellitus without complications: Secondary | ICD-10-CM | POA: Diagnosis present

## 2019-03-22 DIAGNOSIS — Z7984 Long term (current) use of oral hypoglycemic drugs: Secondary | ICD-10-CM

## 2019-03-22 DIAGNOSIS — Z419 Encounter for procedure for purposes other than remedying health state, unspecified: Secondary | ICD-10-CM

## 2019-03-22 LAB — GLUCOSE, CAPILLARY
Glucose-Capillary: 188 mg/dL — ABNORMAL HIGH (ref 70–99)
Glucose-Capillary: 164 mg/dL — ABNORMAL HIGH (ref 70–99)
Glucose-Capillary: 198 mg/dL — ABNORMAL HIGH (ref 70–99)
Glucose-Capillary: 127 mg/dL — ABNORMAL HIGH (ref 70–99)
Glucose-Capillary: 153 mg/dL — ABNORMAL HIGH (ref 70–99)
Glucose-Capillary: 113 mg/dL — ABNORMAL HIGH (ref 70–99)
Glucose-Capillary: 129 mg/dL — ABNORMAL HIGH (ref 70–99)
Glucose-Capillary: 137 mg/dL — ABNORMAL HIGH (ref 70–99)
Glucose-Capillary: 218 mg/dL — ABNORMAL HIGH (ref 70–99)
Glucose-Capillary: 238 mg/dL — ABNORMAL HIGH (ref 70–99)

## 2019-03-22 LAB — TYPE AND SCREEN
ABO/RH(D): O NEG
Antibody Screen: NEGATIVE

## 2019-03-22 IMAGING — RF DG LUMBAR SPINE 2-3V
1 series · 2 of 2 positions shown · non-contrast
Comparison: MRI [DATE]

CLINICAL DATA: L4-5 posterior fusion

EXAM:
LUMBAR SPINE - 2-3 VIEW; DG C-ARM 1-60 MIN

[Series 1: run · 2 of 2 slices shown]
[im 1/2]
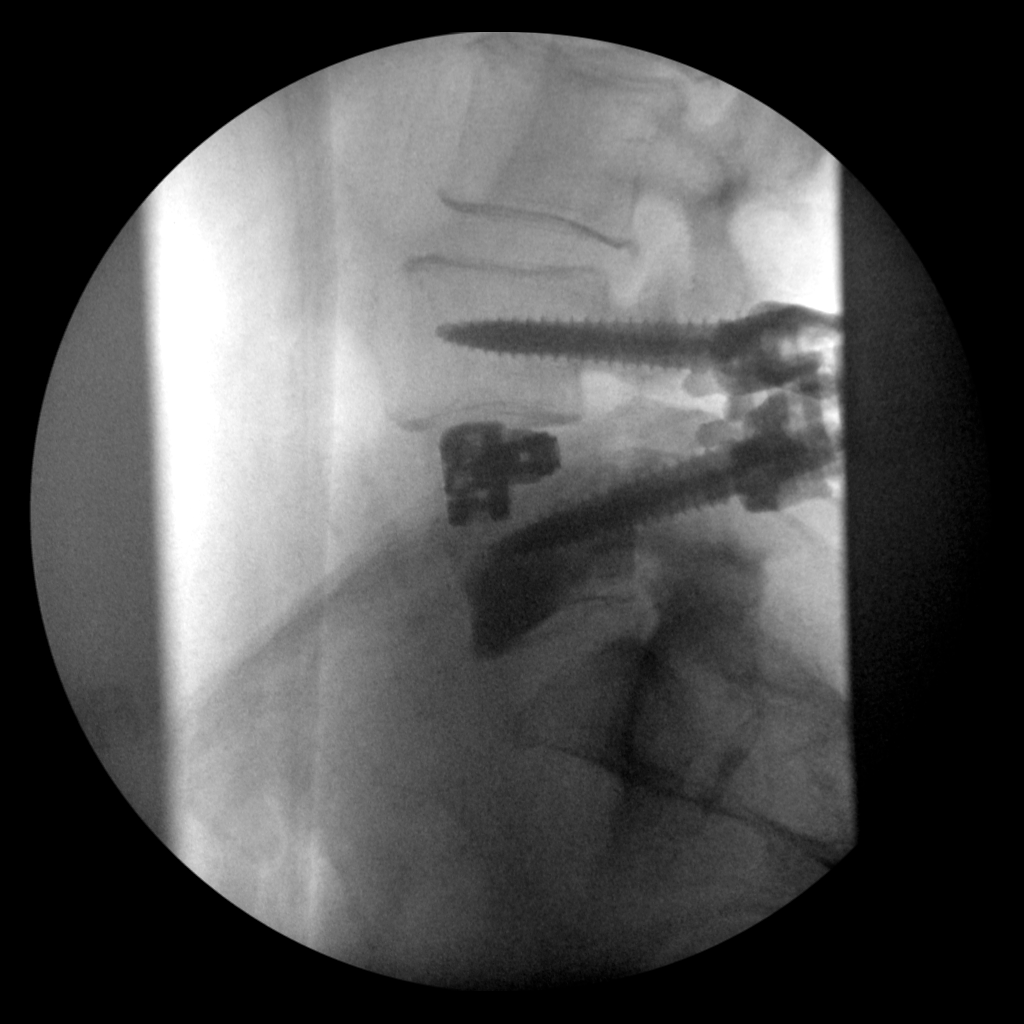
[im 2/2]
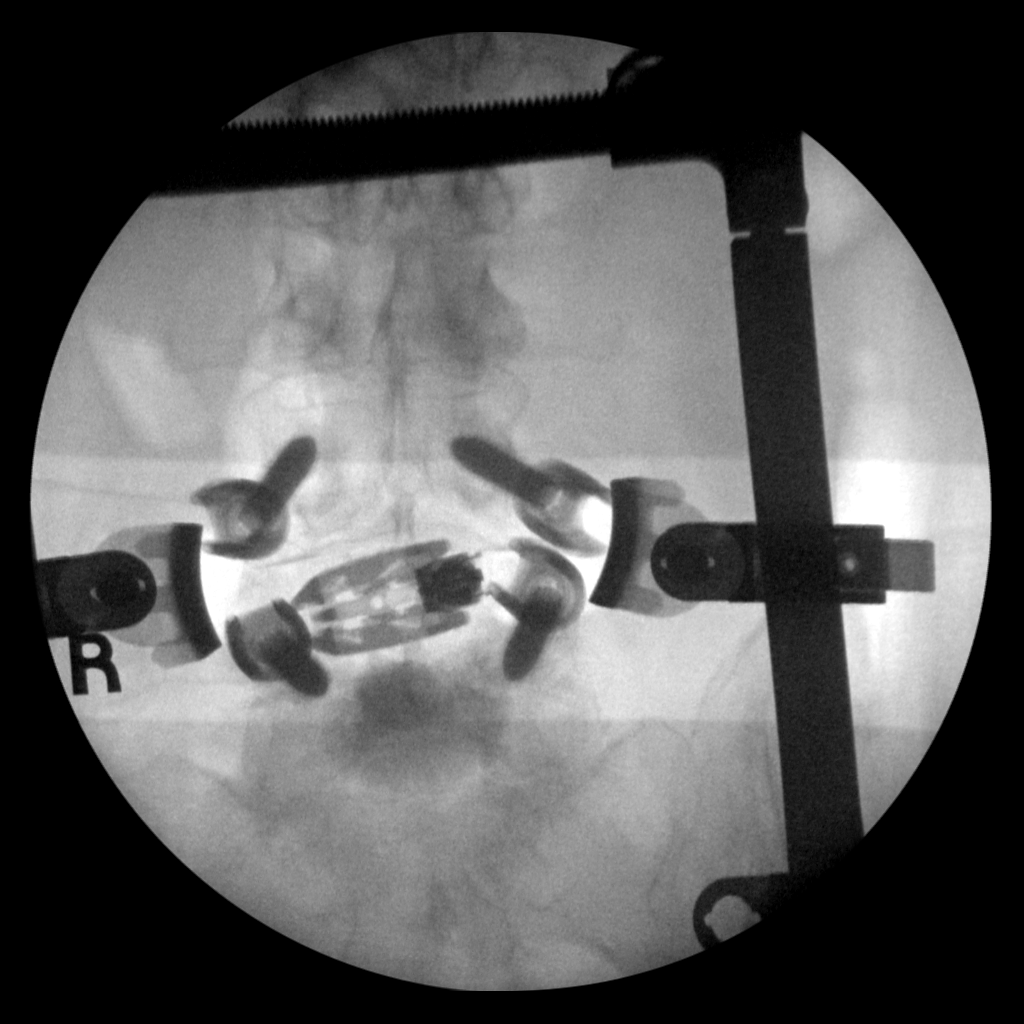

[2 of 2 positions shown; findings below may reference images not displayed]

FINDINGS: Two intraoperative spot images demonstrate changes of posterior
fusion at L4-5. Prior vertebroplasty at L5.
IMPRESSION: Posterior fusion L4-5.  No visible complicating feature.

## 2019-03-22 IMAGING — CR DG LUMBAR SPINE 1V
1 series · 1 of 1 positions shown · non-contrast
Comparison: [DATE].

CLINICAL DATA: Elective lumbar surgery.

EXAM:
LUMBAR SPINE - 1 VIEW

[lateral]
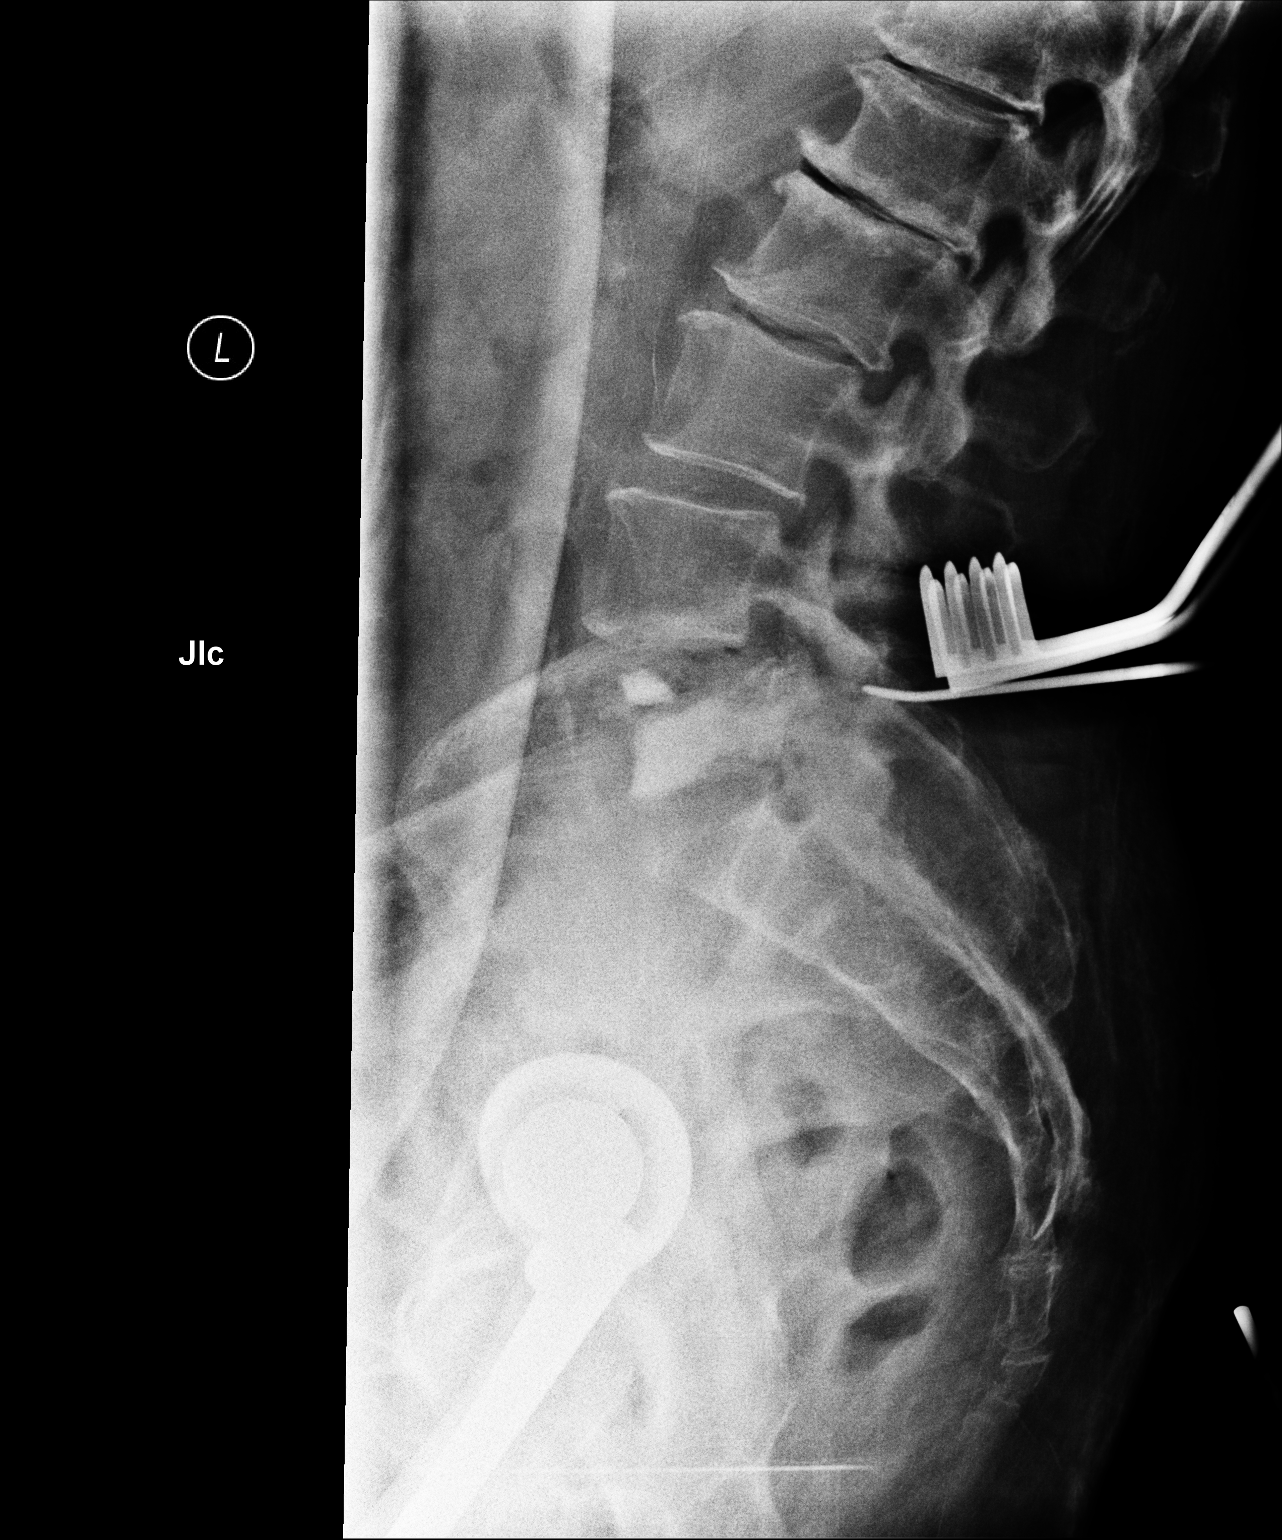

[1 of 1 positions shown; findings below may reference images not displayed]

FINDINGS: Single intraoperative cross-table lateral projection is obtained of
the lumbar spine. This demonstrates a surgical probe directed toward
the posterior elements of L5.
IMPRESSION: Surgical localization as described above.

## 2019-03-22 IMAGING — RF DG C-ARM 1-60 MIN
1 series · 2 of 2 positions shown · non-contrast
Comparison: MRI [DATE]

CLINICAL DATA: L4-5 posterior fusion

EXAM:
LUMBAR SPINE - 2-3 VIEW; DG C-ARM 1-60 MIN

[Series 1: run · 2 of 2 slices shown]
[im 1/2]
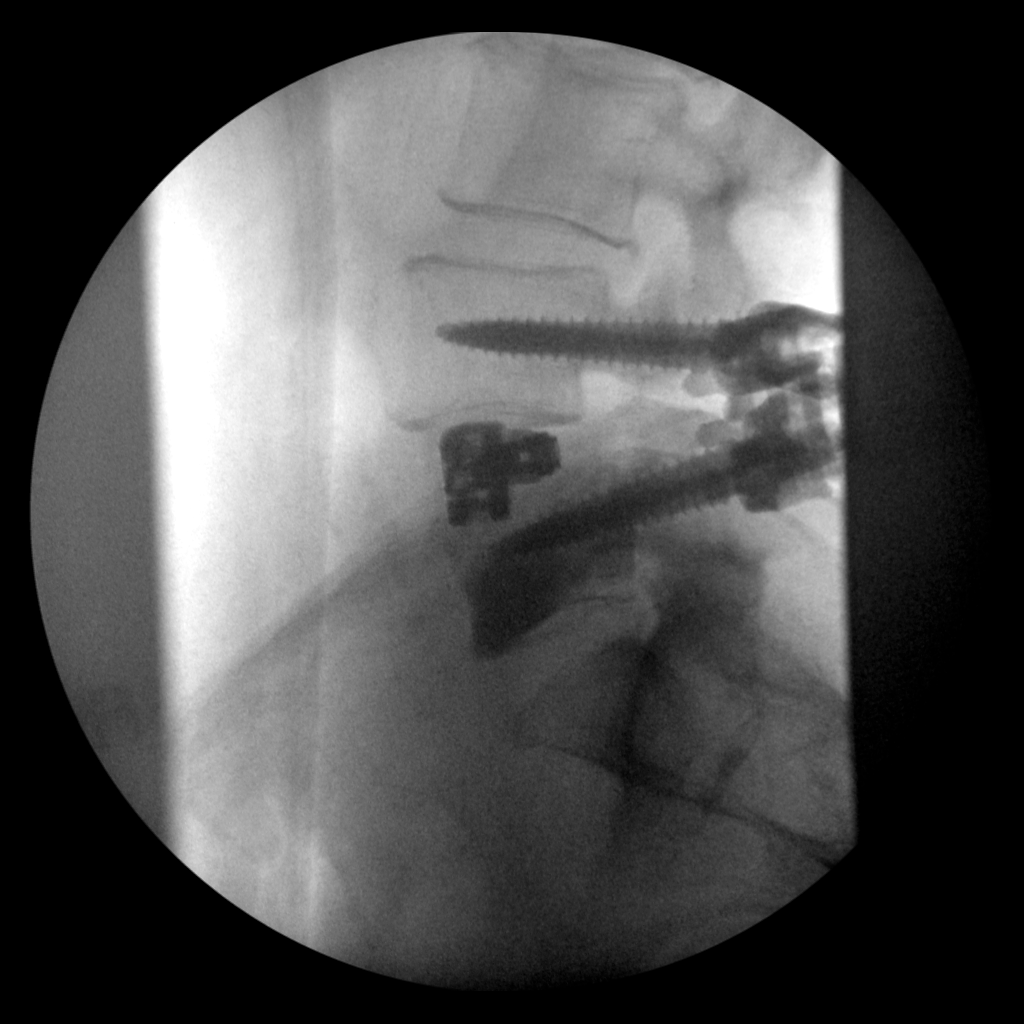
[im 2/2]
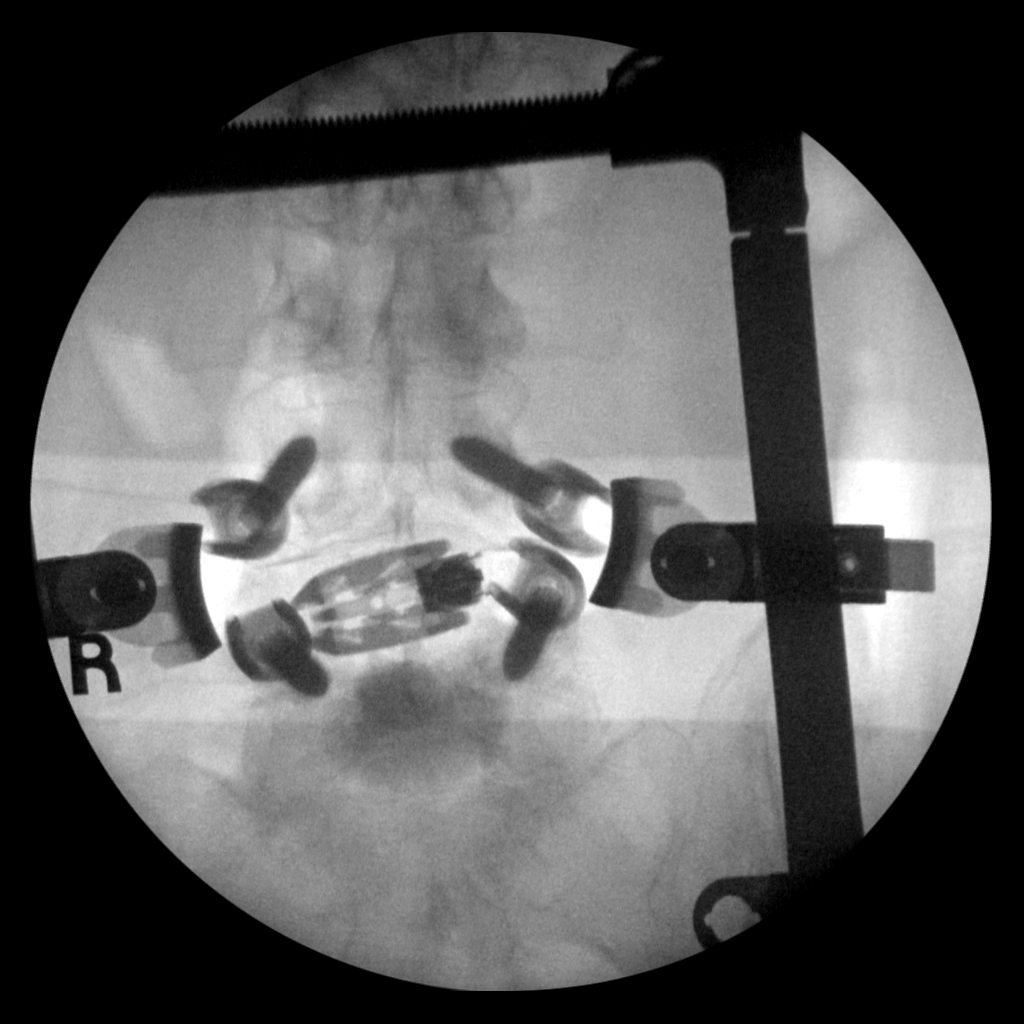

[2 of 2 positions shown; findings below may reference images not displayed]

FINDINGS: Two intraoperative spot images demonstrate changes of posterior
fusion at L4-5. Prior vertebroplasty at L5.
IMPRESSION: Posterior fusion L4-5.  No visible complicating feature.

## 2019-03-22 SURGERY — POSTERIOR LUMBAR FUSION 1 LEVEL
Anesthesia: General

## 2019-03-22 MED ORDER — THROMBIN 5000 UNITS EX SOLR
CUTANEOUS | Status: AC
  Filled 2019-03-22: qty 5000

## 2019-03-22 MED ORDER — FENTANYL CITRATE (PF) 100 MCG/2ML IJ SOLN
INTRAMUSCULAR | Status: DC | PRN
  Administered 2019-03-22 (×4): 50 ug via INTRAVENOUS
  Administered 2019-03-22: 25 ug via INTRAVENOUS
  Administered 2019-03-22: 100 ug via INTRAVENOUS
  Administered 2019-03-22: 25 ug via INTRAVENOUS

## 2019-03-22 MED ORDER — THROMBIN 5000 UNITS EX SOLR
OROMUCOSAL | Status: DC | PRN
  Administered 2019-03-22 (×2): 5 mL via TOPICAL

## 2019-03-22 MED ORDER — HYDROMORPHONE HCL 1 MG/ML IJ SOLN
0.2500 mg | INTRAMUSCULAR | Status: DC | PRN
  Administered 2019-03-22 (×2): 0.5 mg via INTRAVENOUS

## 2019-03-22 MED ORDER — INSULIN ASPART 100 UNIT/ML ~~LOC~~ SOLN
0.0000 [IU] | Freq: Three times a day (TID) | SUBCUTANEOUS | Status: DC
  Administered 2019-03-23: 4 [IU] via SUBCUTANEOUS

## 2019-03-22 MED ORDER — BACITRACIN ZINC 500 UNIT/GM EX OINT
TOPICAL_OINTMENT | CUTANEOUS | Status: DC | PRN
  Administered 2019-03-22: 1 via TOPICAL

## 2019-03-22 MED ORDER — DEXAMETHASONE SODIUM PHOSPHATE 10 MG/ML IJ SOLN
INTRAMUSCULAR | Status: DC | PRN
  Administered 2019-03-22: 4 mg via INTRAVENOUS

## 2019-03-22 MED ORDER — BISACODYL 10 MG RE SUPP: 10.0000 mg | Freq: Every day | RECTAL | Status: DC | PRN

## 2019-03-22 MED ORDER — PREDNISONE 5 MG PO TABS
5.0000 mg | ORAL_TABLET | Freq: Every day | ORAL | Status: DC
  Filled 2019-03-22: qty 1

## 2019-03-22 MED ORDER — OXYCODONE HCL 5 MG PO TABS
5.0000 mg | ORAL_TABLET | ORAL | Status: DC | PRN
  Administered 2019-03-23: 5 mg via ORAL
  Filled 2019-03-22: qty 1

## 2019-03-22 MED ORDER — SUGAMMADEX SODIUM 200 MG/2ML IV SOLN
INTRAVENOUS | Status: DC | PRN
  Administered 2019-03-22: 400 mg via INTRAVENOUS

## 2019-03-22 MED ORDER — GABAPENTIN 300 MG PO CAPS
600.0000 mg | ORAL_CAPSULE | Freq: Four times a day (QID) | ORAL | Status: DC
  Administered 2019-03-22 – 2019-03-23 (×3): 600 mg via ORAL
  Filled 2019-03-22 (×3): qty 2

## 2019-03-22 MED ORDER — LIDOCAINE 2% (20 MG/ML) 5 ML SYRINGE
INTRAMUSCULAR | Status: DC | PRN
  Administered 2019-03-22: 40 mg via INTRAVENOUS

## 2019-03-22 MED ORDER — ACETAMINOPHEN 650 MG RE SUPP: 650.0000 mg | RECTAL | Status: DC | PRN

## 2019-03-22 MED ORDER — ROCURONIUM BROMIDE 10 MG/ML (PF) SYRINGE
PREFILLED_SYRINGE | INTRAVENOUS | Status: DC | PRN
  Administered 2019-03-22: 20 mg via INTRAVENOUS
  Administered 2019-03-22: 30 mg via INTRAVENOUS
  Administered 2019-03-22: 60 mg via INTRAVENOUS
  Administered 2019-03-22: 30 mg via INTRAVENOUS
  Administered 2019-03-22: 20 mg via INTRAVENOUS

## 2019-03-22 MED ORDER — INSULIN REGULAR(HUMAN) IN NACL 100-0.9 UT/100ML-% IV SOLN
INTRAVENOUS | Status: DC | PRN
  Administered 2019-03-22: 5.5 [IU]/h via INTRAVENOUS

## 2019-03-22 MED ORDER — ZOLPIDEM TARTRATE 5 MG PO TABS: 5.0000 mg | ORAL_TABLET | Freq: Every evening | ORAL | Status: DC | PRN

## 2019-03-22 MED ORDER — VANCOMYCIN HCL IN DEXTROSE 1-5 GM/200ML-% IV SOLN
1000.0000 mg | INTRAVENOUS | Status: AC
  Administered 2019-03-22: 1000 mg via INTRAVENOUS
  Filled 2019-03-22: qty 200

## 2019-03-22 MED ORDER — CYCLOBENZAPRINE HCL 10 MG PO TABS
ORAL_TABLET | ORAL | Status: AC
  Filled 2019-03-22: qty 1

## 2019-03-22 MED ORDER — FENTANYL CITRATE (PF) 250 MCG/5ML IJ SOLN
INTRAMUSCULAR | Status: AC
  Filled 2019-03-22: qty 5

## 2019-03-22 MED ORDER — POTASSIUM CHLORIDE ER 10 MEQ PO TBCR: 10.0000 meq | EXTENDED_RELEASE_TABLET | Freq: Every day | ORAL | Status: DC | PRN

## 2019-03-22 MED ORDER — OXYCODONE HCL 5 MG PO TABS
10.0000 mg | ORAL_TABLET | ORAL | Status: DC | PRN
  Administered 2019-03-22 (×2): 10 mg via ORAL
  Filled 2019-03-22 (×3): qty 2

## 2019-03-22 MED ORDER — HYDROCORTISONE NA SUCCINATE PF 100 MG IJ SOLR
50.0000 mg | Freq: Three times a day (TID) | INTRAMUSCULAR | Status: AC
  Administered 2019-03-22 – 2019-03-23 (×3): 50 mg via INTRAVENOUS
  Filled 2019-03-22 (×3): qty 1

## 2019-03-22 MED ORDER — ONDANSETRON HCL 4 MG/2ML IJ SOLN
INTRAMUSCULAR | Status: DC | PRN
  Administered 2019-03-22: 4 mg via INTRAVENOUS

## 2019-03-22 MED ORDER — HYDROCODONE-ACETAMINOPHEN 5-325 MG PO TABS: 1.0000 | ORAL_TABLET | ORAL | Status: DC | PRN

## 2019-03-22 MED ORDER — LATANOPROST 0.005 % OP SOLN
1.0000 [drp] | Freq: Every day | OPHTHALMIC | Status: DC
  Filled 2019-03-22: qty 2.5

## 2019-03-22 MED ORDER — HYDROMORPHONE HCL 1 MG/ML IJ SOLN
INTRAMUSCULAR | Status: AC
  Filled 2019-03-22: qty 1

## 2019-03-22 MED ORDER — BUPIVACAINE LIPOSOME 1.3 % IJ SUSP
INTRAMUSCULAR | Status: DC | PRN
  Administered 2019-03-22: 20 mL

## 2019-03-22 MED ORDER — ROCURONIUM BROMIDE 10 MG/ML (PF) SYRINGE
PREFILLED_SYRINGE | INTRAVENOUS | Status: AC
  Filled 2019-03-22: qty 20

## 2019-03-22 MED ORDER — ACETAMINOPHEN 500 MG PO TABS
1000.0000 mg | ORAL_TABLET | Freq: Four times a day (QID) | ORAL | Status: AC
  Administered 2019-03-22 – 2019-03-23 (×3): 1000 mg via ORAL
  Filled 2019-03-22 (×3): qty 2

## 2019-03-22 MED ORDER — ONDANSETRON HCL 4 MG/2ML IJ SOLN: 4.0000 mg | Freq: Four times a day (QID) | INTRAMUSCULAR | Status: DC | PRN

## 2019-03-22 MED ORDER — ACETAMINOPHEN 325 MG PO TABS: 650.0000 mg | ORAL_TABLET | ORAL | Status: DC | PRN

## 2019-03-22 MED ORDER — PROPOFOL 10 MG/ML IV BOLUS
INTRAVENOUS | Status: AC
  Filled 2019-03-22: qty 40

## 2019-03-22 MED ORDER — MORPHINE SULFATE (PF) 4 MG/ML IV SOLN: 4.0000 mg | INTRAVENOUS | Status: DC | PRN

## 2019-03-22 MED ORDER — LACTATED RINGERS IV SOLN: INTRAVENOUS | Status: DC | PRN

## 2019-03-22 MED ORDER — MIDAZOLAM HCL 2 MG/2ML IJ SOLN
INTRAMUSCULAR | Status: DC | PRN
  Administered 2019-03-22: 2 mg via INTRAVENOUS

## 2019-03-22 MED ORDER — ACETAMINOPHEN 500 MG PO TABS
1000.0000 mg | ORAL_TABLET | Freq: Once | ORAL | Status: DC
  Filled 2019-03-22: qty 2

## 2019-03-22 MED ORDER — LIDOCAINE-EPINEPHRINE 1 %-1:100000 IJ SOLN
INTRAMUSCULAR | Status: AC
  Filled 2019-03-22: qty 1

## 2019-03-22 MED ORDER — CHLORHEXIDINE GLUCONATE CLOTH 2 % EX PADS: 6.0000 | MEDICATED_PAD | Freq: Once | CUTANEOUS | Status: DC

## 2019-03-22 MED ORDER — LIDOCAINE-EPINEPHRINE 1 %-1:100000 IJ SOLN
INTRAMUSCULAR | Status: DC | PRN
  Administered 2019-03-22: 10 mL

## 2019-03-22 MED ORDER — INSULIN ASPART 100 UNIT/ML ~~LOC~~ SOLN
0.0000 [IU] | Freq: Every day | SUBCUTANEOUS | Status: DC
  Administered 2019-03-22: 22:00:00 2 [IU] via SUBCUTANEOUS

## 2019-03-22 MED ORDER — PHENOL 1.4 % MT LIQD: 1.0000 | OROMUCOSAL | Status: DC | PRN

## 2019-03-22 MED ORDER — SODIUM CHLORIDE 0.9% FLUSH
3.0000 mL | Freq: Two times a day (BID) | INTRAVENOUS | Status: DC
  Administered 2019-03-22 (×2): 3 mL via INTRAVENOUS

## 2019-03-22 MED ORDER — MIDAZOLAM HCL 2 MG/2ML IJ SOLN: 0.5000 mg | Freq: Once | INTRAMUSCULAR | Status: DC | PRN

## 2019-03-22 MED ORDER — MIDAZOLAM HCL 2 MG/2ML IJ SOLN
INTRAMUSCULAR | Status: AC
  Filled 2019-03-22: qty 2

## 2019-03-22 MED ORDER — MENTHOL 3 MG MT LOZG: 1.0000 | LOZENGE | OROMUCOSAL | Status: DC | PRN

## 2019-03-22 MED ORDER — CYCLOBENZAPRINE HCL 10 MG PO TABS
10.0000 mg | ORAL_TABLET | Freq: Three times a day (TID) | ORAL | Status: DC | PRN
  Administered 2019-03-22: 13:00:00 10 mg via ORAL
  Filled 2019-03-22: qty 1

## 2019-03-22 MED ORDER — SCOPOLAMINE 1 MG/3DAYS TD PT72
1.0000 | MEDICATED_PATCH | Freq: Once | TRANSDERMAL | Status: DC
  Administered 2019-03-22: 1.5 mg via TRANSDERMAL
  Filled 2019-03-22: qty 1

## 2019-03-22 MED ORDER — MEPERIDINE HCL 25 MG/ML IJ SOLN: 6.2500 mg | INTRAMUSCULAR | Status: DC | PRN

## 2019-03-22 MED ORDER — DOCUSATE SODIUM 100 MG PO CAPS
100.0000 mg | ORAL_CAPSULE | Freq: Two times a day (BID) | ORAL | Status: DC
  Administered 2019-03-22 – 2019-03-23 (×2): 100 mg via ORAL
  Filled 2019-03-22 (×2): qty 1

## 2019-03-22 MED ORDER — INSULIN ASPART 100 UNIT/ML ~~LOC~~ SOLN
0.0000 [IU] | SUBCUTANEOUS | Status: DC
  Administered 2019-03-22: 17:00:00 7 [IU] via SUBCUTANEOUS

## 2019-03-22 MED ORDER — BUPIVACAINE LIPOSOME 1.3 % IJ SUSP
20.0000 mL | Freq: Once | INTRAMUSCULAR | Status: DC
  Filled 2019-03-22: qty 20

## 2019-03-22 MED ORDER — METFORMIN HCL 500 MG PO TABS
500.0000 mg | ORAL_TABLET | Freq: Two times a day (BID) | ORAL | Status: DC
  Administered 2019-03-22 – 2019-03-23 (×2): 500 mg via ORAL
  Filled 2019-03-22 (×2): qty 1

## 2019-03-22 MED ORDER — BACITRACIN ZINC 500 UNIT/GM EX OINT
TOPICAL_OINTMENT | CUTANEOUS | Status: AC
  Filled 2019-03-22: qty 28.35

## 2019-03-22 MED ORDER — SODIUM CHLORIDE 0.9 % IV SOLN
250.0000 mL | INTRAVENOUS | Status: DC
  Administered 2019-03-22: 16:00:00 250 mL via INTRAVENOUS

## 2019-03-22 MED ORDER — SODIUM CHLORIDE 0.9 % IV SOLN
INTRAVENOUS | Status: DC | PRN
  Administered 2019-03-22: 500 mL

## 2019-03-22 MED ORDER — SODIUM CHLORIDE 0.9% FLUSH: 3.0000 mL | INTRAVENOUS | Status: DC | PRN

## 2019-03-22 MED ORDER — ONDANSETRON HCL 4 MG PO TABS: 4.0000 mg | ORAL_TABLET | Freq: Four times a day (QID) | ORAL | Status: DC | PRN

## 2019-03-22 MED ORDER — PROMETHAZINE HCL 25 MG/ML IJ SOLN: 6.2500 mg | INTRAMUSCULAR | Status: DC | PRN

## 2019-03-22 MED ORDER — PROPOFOL 10 MG/ML IV BOLUS
INTRAVENOUS | Status: DC | PRN
  Administered 2019-03-22: 80 mg via INTRAVENOUS

## 2019-03-22 MED ORDER — FUROSEMIDE 20 MG PO TABS
20.0000 mg | ORAL_TABLET | Freq: Every day | ORAL | Status: DC | PRN
  Filled 2019-03-22: qty 1

## 2019-03-22 MED ORDER — VANCOMYCIN HCL IN DEXTROSE 1-5 GM/200ML-% IV SOLN
1000.0000 mg | Freq: Once | INTRAVENOUS | Status: AC
  Administered 2019-03-22: 21:00:00 1000 mg via INTRAVENOUS
  Filled 2019-03-22: qty 200

## 2019-03-22 MED ORDER — 0.9 % SODIUM CHLORIDE (POUR BTL) OPTIME
TOPICAL | Status: DC | PRN
  Administered 2019-03-22: 1000 mL

## 2019-03-22 MED ORDER — FLUTICASONE FUROATE-VILANTEROL 100-25 MCG/INH IN AEPB
1.0000 | INHALATION_SPRAY | Freq: Every day | RESPIRATORY_TRACT | Status: DC
  Filled 2019-03-22: qty 28

## 2019-03-22 SURGICAL SUPPLY — 72 items
BAG DECANTER FOR FLEXI CONT (MISCELLANEOUS) ×2 IMPLANT
BASKET BONE COLLECTION (BASKET) ×2 IMPLANT
BENZOIN TINCTURE PRP APPL 2/3 (GAUZE/BANDAGES/DRESSINGS) ×2 IMPLANT
BLADE CLIPPER SURG (BLADE) IMPLANT
BUR MATCHSTICK NEURO 3.0 LAGG (BURR) ×2 IMPLANT
BUR PRECISION FLUTE 6.0 (BURR) ×2 IMPLANT
CANISTER SUCT 3000ML PPV (MISCELLANEOUS) ×2 IMPLANT
CAP LOCK DLX THRD (Cap) ×8 IMPLANT
CARTRIDGE OIL MAESTRO DRILL (MISCELLANEOUS) ×1 IMPLANT
CNTNR URN SCR LID CUP LEK RST (MISCELLANEOUS) ×1 IMPLANT
CONT SPEC 4OZ STRL OR WHT (MISCELLANEOUS) ×1
COVER BACK TABLE 60X90IN (DRAPES) ×2 IMPLANT
COVER WAND RF STERILE (DRAPES) ×2 IMPLANT
DECANTER SPIKE VIAL GLASS SM (MISCELLANEOUS) ×2 IMPLANT
DERMABOND ADVANCED (GAUZE/BANDAGES/DRESSINGS) ×1
DERMABOND ADVANCED .7 DNX12 (GAUZE/BANDAGES/DRESSINGS) ×1 IMPLANT
DIFFUSER DRILL AIR PNEUMATIC (MISCELLANEOUS) ×2 IMPLANT
DRAPE C-ARM 42X72 X-RAY (DRAPES) ×4 IMPLANT
DRAPE HALF SHEET 40X57 (DRAPES) ×2 IMPLANT
DRAPE LAPAROTOMY 100X72X124 (DRAPES) ×2 IMPLANT
DRAPE SURG 17X23 STRL (DRAPES) ×8 IMPLANT
DRSG OPSITE POSTOP 4X6 (GAUZE/BANDAGES/DRESSINGS) ×2 IMPLANT
ELECT BLADE 4.0 EZ CLEAN MEGAD (MISCELLANEOUS) ×2
ELECT REM PT RETURN 9FT ADLT (ELECTROSURGICAL) ×2
ELECTRODE BLDE 4.0 EZ CLN MEGD (MISCELLANEOUS) ×1 IMPLANT
ELECTRODE REM PT RTRN 9FT ADLT (ELECTROSURGICAL) ×1 IMPLANT
EVACUATOR 1/8 PVC DRAIN (DRAIN) IMPLANT
GAUZE 4X4 16PLY RFD (DISPOSABLE) ×2 IMPLANT
GAUZE SPONGE 4X4 12PLY STRL (GAUZE/BANDAGES/DRESSINGS) ×2 IMPLANT
GLOVE BIO SURGEON STRL SZ 6.5 (GLOVE) ×6 IMPLANT
GLOVE BIO SURGEON STRL SZ8 (GLOVE) ×4 IMPLANT
GLOVE BIO SURGEON STRL SZ8.5 (GLOVE) ×4 IMPLANT
GLOVE ECLIPSE 6.5 STRL STRAW (GLOVE) ×2 IMPLANT
GLOVE EXAM NITRILE XL STR (GLOVE) IMPLANT
GLOVE SURG SS PI 6.5 STRL IVOR (GLOVE) ×2 IMPLANT
GLOVE SURG SS PI 7.5 STRL IVOR (GLOVE) ×2 IMPLANT
GLOVE SURG SS PI 8.0 STRL IVOR (GLOVE) ×2 IMPLANT
GOWN STRL REUS W/ TWL LRG LVL3 (GOWN DISPOSABLE) ×4 IMPLANT
GOWN STRL REUS W/ TWL XL LVL3 (GOWN DISPOSABLE) ×2 IMPLANT
GOWN STRL REUS W/TWL 2XL LVL3 (GOWN DISPOSABLE) IMPLANT
GOWN STRL REUS W/TWL LRG LVL3 (GOWN DISPOSABLE) ×4
GOWN STRL REUS W/TWL XL LVL3 (GOWN DISPOSABLE) ×2
HEMOSTAT POWDER KIT SURGIFOAM (HEMOSTASIS) ×4 IMPLANT
KIT BASIN OR (CUSTOM PROCEDURE TRAY) ×2 IMPLANT
KIT TURNOVER KIT B (KITS) ×2 IMPLANT
MILL MEDIUM DISP (BLADE) IMPLANT
NEEDLE HYPO 21X1.5 SAFETY (NEEDLE) ×2 IMPLANT
NEEDLE HYPO 22GX1.5 SAFETY (NEEDLE) ×2 IMPLANT
NS IRRIG 1000ML POUR BTL (IV SOLUTION) ×2 IMPLANT
OIL CARTRIDGE MAESTRO DRILL (MISCELLANEOUS) ×2
PACK LAMINECTOMY NEURO (CUSTOM PROCEDURE TRAY) ×2 IMPLANT
PAD ARMBOARD 7.5X6 YLW CONV (MISCELLANEOUS) IMPLANT
PATTIES SURGICAL .5 X1 (DISPOSABLE) IMPLANT
PATTIES SURGICAL 1X1 (DISPOSABLE) ×2 IMPLANT
PUTTY DBM 10CC CALC GRAN (Putty) ×2 IMPLANT
ROD CREO DLX CVD 6.35X40 (Rod) ×2 IMPLANT
ROD CURVED TI 6.35X40 (Rod) ×2 IMPLANT
SCREW PA DLX CREO 7.5X45 (Screw) ×4 IMPLANT
SCREW PA DLX CREO 7.5X50 (Screw) ×4 IMPLANT
SPACER ALTERA 10X31-8 (Spacer) ×2 IMPLANT
SPONGE LAP 4X18 RFD (DISPOSABLE) IMPLANT
SPONGE NEURO XRAY DETECT 1X3 (DISPOSABLE) IMPLANT
SPONGE SURGIFOAM ABS GEL 100 (HEMOSTASIS) IMPLANT
STRIP CLOSURE SKIN 1/2X4 (GAUZE/BANDAGES/DRESSINGS) ×2 IMPLANT
SUT VIC AB 1 CT1 18XBRD ANBCTR (SUTURE) ×1 IMPLANT
SUT VIC AB 1 CT1 8-18 (SUTURE) ×1
SUT VIC AB 2-0 CP2 18 (SUTURE) ×2 IMPLANT
SYR 20ML LL LF (SYRINGE) ×2 IMPLANT
TOWEL GREEN STERILE (TOWEL DISPOSABLE) ×2 IMPLANT
TOWEL GREEN STERILE FF (TOWEL DISPOSABLE) ×2 IMPLANT
TRAY FOLEY MTR SLVR 16FR STAT (SET/KITS/TRAYS/PACK) ×2 IMPLANT
WATER STERILE IRR 1000ML POUR (IV SOLUTION) ×2 IMPLANT

## 2019-03-22 NOTE — H&P (Signed)
Subjective: The patient is a 59 year old obese white female with lymphedema who has complained of back and left great and right leg pain.  She has failed medical management.  She was worked up with a lumbar MRI which demonstrated a spondylolisthesis and spinal stenosis.  I discussed the various treatment options.  She has decided to proceed with surgery.  Past Medical History:  Diagnosis Date  . Complication of anesthesia    long time to wake up, headache  . Diabetes mellitus without complication ([place])   . Lymphedema    in legs/feet  . Neuromuscular disorder [facility])     Past Surgical History:  Procedure Laterality Date  . CATARACT EXTRACTION, BILATERAL    . JOINT REPLACEMENT Left    hip  . KYPHOPLASTY N/A 01/05/2019   Procedure: KYPHOPLASTY L5;  Surgeon: Hessie Knows, MD;  Location: [facility] ORS;  Service: Orthopedics;  Laterality: N/A;  . MULTIPLE TOOTH EXTRACTIONS  06/2018   2-3 weeks ago; 10 teeth  . TUBAL LIGATION      Allergies  Allergen Reactions  . Amoxicillin Anaphylaxis  . Morphine And Related Swelling  . Penicillins Swelling    Did it involve swelling of the face/tongue/throat, SOB, or low BP? Unknown Did it involve sudden or severe rash/hives, skin peeling, or any reaction on the inside of your mouth or nose? Unknown Did you need to seek medical attention at a hospital or doctor's office? Unknown When did it last happen?unkn  If all above answers are "NO", may proceed with cephalosporin use.     Social History   Tobacco Use  . Smoking status: Current Every Day Smoker    Packs/day: 1.50    Years: 40.00    Pack years: 60.00    Types: Cigarettes  . Smokeless tobacco: Never Used  Substance Use Topics  . Alcohol use: Not Currently    Alcohol/week: 0.0 standard drinks    History reviewed. No pertinent family history. Prior to Admission medications   Medication Sig Start Date End Date Taking? Authorizing Provider  acetaminophen (TYLENOL) 500 MG tablet Take  1,000 mg by mouth every 6 (six) hours as needed.   Yes [provider]  BREO ELLIPTA 100-25 MCG/INH AEPB Inhale 1 puff into the lungs daily.  03/17/16  Yes [provider]  diphenhydrAMINE HCl (BENADRYL PO) Take 1 tablet by mouth at bedtime.   Yes [provider]  furosemide (LASIX) 20 MG tablet Take 20 mg by mouth daily as needed for edema.  01/20/16  Yes [provider]  gabapentin (NEURONTIN) 300 MG capsule Take 2 capsules (600 mg total) by mouth 2 (two) times daily. Patient taking differently: Take 600 mg by mouth 4 (four) times daily.  10/17/18  Yes Gillis Santa, MD  glucose blood (BAYER CONTOUR NEXT TEST) test strip 1 each by Other route in the morning and at bedtime.  04/29/16  Yes [provider]  KLOR-CON 10 10 MEQ tablet Take 10 mEq by mouth daily as needed (takes with furosemide).  01/20/16  Yes [provider]  latanoprost (XALATAN) 0.005 % ophthalmic solution Place 1 drop into both eyes at bedtime. 03/18/18  Yes [provider]  metFORMIN (GLUCOPHAGE) 500 MG tablet Take 500 mg by mouth 2 (two) times daily. 06/04/14  Yes [provider]  oxyCODONE (OXY IR/ROXICODONE) 5 MG immediate release tablet Take 5 mg by mouth 2 (two) times daily as needed for pain.   Yes [provider]  Blood Glucose Monitoring Suppl (CONTOUR NEXT EZ MONITOR)  w/Device KIT 1 Device by Does not apply route in the morning and at bedtime.  04/29/16   [provider]  HYDROcodone-acetaminophen (NORCO) 5-325 MG tablet Take 1 tablet by mouth every 6 (six) hours as needed for moderate pain. Patient not taking: Reported on 02/13/2019 01/05/19   Hessie Knows, MD  ibuprofen (ADVIL,MOTRIN) 200 MG tablet Take 800 mg by mouth every 6 (six) hours as needed for moderate pain.     [provider]  MICROLET LANCETS MISC 1 Stick by Other route in the morning and at bedtime.  04/29/16   [provider]  predniSONE (DELTASONE) 20 MG tablet  Take 20 mg by mouth daily with breakfast.    [provider]     Review of Systems  Positive ROS: As above  All other systems have been reviewed and were otherwise negative with the exception of those mentioned in the HPI and as above.  Objective: Vital signs in last 24 hours: Temp:  [99 F (37.2 C)] 99 F (37.2 C) (03/03 0648) Pulse Rate:  [103] 103 (03/03 0648) Resp:  [20] 20 (03/03 0648) BP: (136)/(65) 136/65 (03/03 0648) SpO2:  [98 %] 98 % (03/03 0648) Weight:  [100.7 kg] 100.7 kg (03/03 0648) Estimated body mass index is 40.6 kg/m as calculated from the following:   Height as of this encounter: 5' 2"  (1.575 m).   Weight as of this encounter: 100.7 kg.   General Appearance: Alert Head: Normocephalic, without obvious abnormality, atraumatic Eyes: PERRL, conjunctiva/corneas clear, EOM's intact,    Ears: Normal  Throat: Normal  Neck: Supple, Back: unremarkable Lungs: Clear to auscultation bilaterally, respirations unlabored Heart: Regular rate and rhythm, no murmur, rub or gallop Abdomen: Soft, non-tender Extremities: Extremities normal, atraumatic, no cyanosis or edema Skin: The patient has bilateral lymphedema with some erythema and drainage from her left lower extremity.  NEUROLOGIC:   Mental status: alert and oriented,Motor Exam - grossly normal Sensory Exam - grossly normal Reflexes:  Coordination - grossly normal Gait - grossly normal Balance - grossly normal Cranial Nerves: I: smell Not tested  II: visual acuity  OS: Normal  OD: Normal   II: visual fields Full to confrontation  II: pupils Equal, round, reactive to light  III,VII: ptosis None  III,IV,VI: extraocular muscles  Full ROM  V: mastication Normal  V: facial light touch sensation  Normal  V,VII: corneal reflex  Present  VII: facial muscle function - upper  Normal  VII: facial muscle function - lower Normal  VIII: hearing Not tested  IX: soft palate elevation  Normal  IX,X: gag  reflex Present  XI: trapezius strength  5/5  XI: sternocleidomastoid strength 5/5  XI: neck flexion strength  5/5  XII: tongue strength  Normal    Data Review Lab Results  Component Value Date   WBC 16.4 (H) 03/16/2019   HGB 15.3 (H) 03/16/2019   HCT 44.6 03/16/2019   MCV 102.8 (H) 03/16/2019   PLT 237 03/16/2019   Lab Results  Component Value Date   NA 138 03/16/2019   K 4.1 03/16/2019   CL 100 03/16/2019   CO2 23 03/16/2019   BUN 10 03/16/2019   CREATININE 0.61 03/16/2019   GLUCOSE 311 (H) 03/16/2019   No results found for: INR, PROTIME  Assessment/Plan: Lumbar spondylolisthesis, spinal stenosis, lumbago, lumbar radiculopathy: I have discussed the situation with the patient.  I reviewed her MRI scan with her and pointed out the abnormalities.  We have discussed the various treatment options  including surgery.  I have described the surgical treatment option of an L4-5 decompression, instrumentation and fusion.  I have shown her surgical models.  I have given her a surgical pamphlet.  We have discussed the risk, benefits, alternatives, expected postop course, and likelihood of achieving our goals with surgery.  I have answered all her questions.  She has decided proceed with surgery.   Ophelia Charter 03/22/2019 8:17 AM

## 2019-03-22 NOTE — Transfer of Care (Signed)
Immediate Anesthesia Transfer of Care Note  Patient: Courtney Henderson  Procedure(s) Performed: POSTERIOR LUMBAR INTERBODY FUSION, POSTERIOR INSTRUMENTATION LUMBAR FOUR- LUMBAR FIVE (N/A )  Patient Location: PACU  Anesthesia Type:General  Level of Consciousness: awake, alert  and oriented  Airway & Oxygen Therapy: Patient Spontanous Breathing and Patient connected to nasal cannula oxygen  Post-op Assessment: Report given to RN, Post -op Vital signs reviewed and stable and Patient moving all extremities  Post vital signs: Reviewed and stable  Last Vitals:  Vitals Value Taken Time  BP 133/114 03/22/19 1254  Temp    Pulse 86 03/22/19 1257  Resp 16 03/22/19 1258  SpO2 92 % 03/22/19 1257  Vitals shown include unvalidated device data.  Last Pain:  Vitals:   03/22/19 0703  TempSrc:   PainSc: 10-Worst pain ever      Patients Stated Pain Goal: 3 (03/22/19 0703)  Complications: No apparent anesthesia complications

## 2019-03-22 NOTE — Anesthesia Postprocedure Evaluation (Signed)
Anesthesia Post Note  Patient: Courtney Henderson  Procedure(s) Performed: POSTERIOR LUMBAR INTERBODY FUSION, POSTERIOR INSTRUMENTATION LUMBAR FOUR- LUMBAR FIVE (N/A )     Patient location during evaluation: PACU Anesthesia Type: General Level of consciousness: awake and alert, patient cooperative and oriented Pain management: pain level controlled Vital Signs Assessment: post-procedure vital signs reviewed and stable Respiratory status: spontaneous breathing, nonlabored ventilation, respiratory function stable and patient connected to nasal cannula oxygen Cardiovascular status: blood pressure returned to baseline and stable Postop Assessment: no apparent nausea or vomiting Anesthetic complications: no    Last Vitals:  Vitals:   03/22/19 1400 03/22/19 1417  BP:  130/66  Pulse: 82   Resp: 15 16  Temp:  36.7 C  SpO2: 91% 93%    Last Pain:  Vitals:   03/22/19 1417  TempSrc:   PainSc: 2                  Kinzie Wickes,E. Etheline Geppert

## 2019-03-22 NOTE — Anesthesia Procedure Notes (Signed)
Procedure Name: Intubation Date/Time: 03/22/2019 8:42 AM Performed by: Leonor Liv, CRNA Pre-anesthesia Checklist: Patient identified, Emergency Drugs available, Suction available and Patient being monitored Patient Re-evaluated:Patient Re-evaluated prior to induction Oxygen Delivery Method: Circle System Utilized Preoxygenation: Pre-oxygenation with 100% oxygen Induction Type: IV induction Ventilation: Mask ventilation without difficulty Laryngoscope Size: Mac and 3 Grade View: Grade I Tube type: Oral Tube size: 7.0 mm Number of attempts: 1 Airway Equipment and Method: Stylet and Oral airway Placement Confirmation: ETT inserted through vocal cords under direct vision,  positive ETCO2 and breath sounds checked- equal and bilateral Secured at: 21 cm Tube secured with: Tape Dental Injury: Teeth and Oropharynx as per pre-operative assessment

## 2019-03-22 NOTE — Evaluation (Signed)
Physical Therapy Evaluation Patient Details Name: Courtney Henderson MRN: [id number] DOB: 29-Jan-1960 Today's Date: 03/22/2019   History of Present Illness  Patient is a 59 y/o female admitted with history of L THA, L5 kyphoplasty, recent tooth extractions, DM and lymphedema in LE's admitted duet o SS and spondylolisthesis now s/p laminotomy/foraminotomies/medial facetectomy bilateral L4-5 and PLIF.  Clinical Impression  Patient presents with mobility limited due to pain, decreased knowledge of precautions, decreased balance and pain.  Currently min A overall and previously was independent with walker.  Feel she will benefit from skilled PT in the acute setting and follow up HHPT at d/c to maximize safety, postural stability and independence.     Follow Up Recommendations Home health PT    Equipment Recommendations  None recommended by PT    Recommendations for Other Services       Precautions / Restrictions Precautions Precautions: Fall;Back Precaution Booklet Issued: Yes (comment) Required Braces or Orthoses: Spinal Brace Spinal Brace: Applied in sitting position;Lumbar corset Restrictions Weight Bearing Restrictions: No      Mobility  Bed Mobility Overal bed mobility: Needs Assistance Bed Mobility: Rolling;Sidelying to Sit;Sit to Sidelying Rolling: Min assist Sidelying to sit: Min assist     Sit to sidelying: Mod assist General bed mobility comments: assist for turning shoulders, for leg off bed side to sit and for both legs into bed sit to side  Transfers Overall transfer level: Needs assistance Equipment used: Rolling walker (2 wheeled) Transfers: Sit to/from Stand Sit to Stand: Min assist         General transfer comment: steadying assist and for anterior weight shift  Ambulation/Gait Ambulation/Gait assistance: Min guard Gait Distance (Feet): 200 Feet Assistive device: Rolling walker (2 wheeled) Gait Pattern/deviations: Step-to pattern;Step-through  pattern;Decreased stride length;Trendelenburg;Shuffle     General Gait Details: L trendelenberg, slow pace, short shuffling steps and increased time for turns  Financial trader Rankin (Stroke Patients Only)       Balance Overall balance assessment: Needs assistance Sitting-balance support: Feet supported Sitting balance-Leahy Scale: Fair     Standing balance support: Bilateral upper extremity supported Standing balance-Leahy Scale: Poor Standing balance comment: UE support for balance                             Pertinent Vitals/Pain Pain Assessment: 0-10 Pain Score: 8  Pain Location: ribs, back Pain Descriptors / Indicators: Aching;Discomfort;Operative site guarding Pain Intervention(s): Monitored during session;Repositioned;Other (comment)(loosened the brace)    Home Living Family/patient expects to be discharged to:: Private residence Living Arrangements: Spouse/significant other Available Help at Discharge: Family Type of Home: House Home Access: Stairs to enter Entrance Stairs-Rails: Right Entrance Stairs-Number of Steps: 4 Home Layout: One level Home Equipment: [place] - 2 wheels;Shower seat;Grab bars - tub/shower;Grab bars - toilet      Prior Function Level of Independence: Independent with assistive device(s)         Comments: limited houskeeping/cooking/driving     Hand Dominance        Extremity/Trunk Assessment   Upper Extremity Assessment Upper Extremity Assessment: Defer to OT evaluation    Lower Extremity Assessment Lower Extremity Assessment: RLE deficits/detail;LLE deficits/detail RLE Deficits / Details: generalized weakness RLE Sensation: decreased light touch;history of peripheral neuropathy LLE Deficits / Details: functional hip weakness with ambulation (trendelenberg); dressing covering lower leg LLE Sensation: decreased light touch;history of peripheral neuropathy  Communication   Communication: No difficulties  Cognition Arousal/Alertness: Awake/alert Behavior During Therapy: Flat affect Overall Cognitive Status: Within Functional Limits for tasks assessed                                        General Comments General comments (skin integrity, edema, etc.): daughter in room and supportive    Exercises     Assessment/Plan    PT Assessment Patient needs continued PT services  PT Problem List Decreased strength;Decreased activity tolerance;Decreased balance;Decreased mobility;Impaired sensation;Decreased safety awareness       PT Treatment Interventions DME instruction;Stair training;Therapeutic activities;Functional mobility training;Gait training;Patient/family education    PT Goals (Current goals can be found in the Care Plan section)  Acute Rehab PT Goals Patient Stated Goal: to return to independent PT Goal Formulation: With patient/family Time For Goal Achievement: 04/05/19 Potential to Achieve Goals: Good    Frequency Min 5X/week   Barriers to discharge        Co-evaluation               AM-PAC PT "6 Clicks" Mobility  Outcome Measure Help needed turning from your back to your side while in a flat bed without using bedrails?: A Little Help needed moving from lying on your back to sitting on the side of a flat bed without using bedrails?: A Little Help needed moving to and from a bed to a chair (including a wheelchair)?: A Little Help needed standing up from a chair using your arms (e.g., wheelchair or bedside chair)?: A Little Help needed to walk in hospital room?: A Little Help needed climbing 3-5 steps with a railing? : A Little 6 Click Score: 18    End of Session   Activity Tolerance: Patient limited by pain Patient left: in bed;with call bell/phone within reach Nurse Communication: Mobility status;Other (comment)(Spo2 91% ion RA at rest) PT Visit Diagnosis: Other abnormalities of gait and  mobility (R26.89);Muscle weakness (generalized) (M62.81);Pain Pain - part of body: (back)    Time: 0102-7253 PT Time Calculation (min) (ACUTE ONLY): 28 min   Charges:   PT Evaluation $PT Eval Moderate Complexity: 1 Mod PT Treatments $Gait Training: 8-22 mins        Sheran Lawless, [facility] [phone] 03/22/2019   Courtney Henderson 03/22/2019, 5:46 PM

## 2019-03-22 NOTE — Op Note (Signed)
Brief history: The patient is a 59 year old white female with a history of an L5 compression fracture status post kyphoplasty.  She has developed symptoms consistent with neurogenic claudication.  She was worked up with a lumbar MRI which demonstrated an L4-5 spinal listhesis with severe spinal stenosis.  I discussed the various treatment options with her.  She has decided to proceed with surgery.  Preoperative diagnosis: L4-5 spondylolisthesis, degenerative disc disease, spinal stenosis compressing both the L4 and the L5 nerve roots; lumbago; lumbar radiculopathy; neurogenic claudication  Postoperative diagnosis: The same  Procedure: Bilateral L4-5 laminotomy/foraminotomies/medial facetectomy to decompress the bilateral L4 and L5 nerve roots(the work required to do this was in addition to the work required to do the posterior lumbar interbody fusion because of the patient's spinal stenosis, facet arthropathy. Etc. requiring a wide decompression of the nerve roots.);  L4-5 transforaminal lumbar interbody fusion with local morselized autograft bone and Zimmer DBM; insertion of interbody prosthesis at L4-5 (globus peek expandable interbody prosthesis); posterior nonsegmental instrumentation from L4 to L5 with globus titanium pedicle screws and rods; posterior lateral arthrodesis at L4-5 with local morselized autograft bone and Zimmer DBM.  Surgeon: Dr. Delma Officer  Asst.: Dr. Barbaraann Barthel and Hildred Priest, NP  Anesthesia: Gen. endotracheal  Estimated blood loss: 250 cc  Drains: None  Complications: None  Description of procedure: The patient was brought to the operating room by the anesthesia team. General endotracheal anesthesia was induced. The patient was turned to the prone position on the Wilson frame. The patient's lumbosacral region was then prepared with Betadine scrub and Betadine solution. Sterile drapes were applied.  I then injected the area to be incised with Marcaine with  epinephrine solution. I then used the scalpel to make a linear midline incision over the L4-5 interspace. I then used electrocautery to perform a bilateral subperiosteal dissection exposing the spinous process and lamina of L4 and L5. We then obtained intraoperative radiograph to confirm our location. We then inserted the Verstrac retractor to provide exposure.  I began the decompression by using the high speed drill to perform laminotomies at L4-5 bilaterally. We then used the Kerrison punches to widen the laminotomy and removed the ligamentum flavum at L4-5 bilaterally. We used the Kerrison punches to remove the medial facets at L4-5 bilaterally. We performed wide foraminotomies about the bilateral L4 and L5 nerve roots completing the decompression.  We now turned our attention to the posterior lumbar interbody fusion. I used a scalpel to incise the intervertebral disc at L4-5 bilaterally. I then performed a partial intervertebral discectomy at L4-5 bilaterally using the pituitary forceps. We prepared the vertebral endplates at L4-5 bilaterally for the fusion by removing the soft tissues with the curettes. We then used the trial spacers to pick the appropriate sized interbody prosthesis. We prefilled his prosthesis with a combination of local morselized autograft bone that we obtained during the decompression as well as Zimmer DBM. We inserted the prefilled prosthesis into the interspace at L4-5, we then turned and expanded the prosthesis. There was a good snug fit of the prosthesis in the interspace. We then filled and the remainder of the intervertebral disc space with local morselized autograft bone and Zimmer DBM. This completed the posterior lumbar interbody arthrodesis.  During the decompression and insertion of the prosthesis the assistant protected the thecal sac and nerve roots with the D'Errico retractor.  We now turned attention to the instrumentation. Under fluoroscopic guidance we cannulated the  bilateral L4 and L5 pedicles with the  bone probe. We then removed the bone probe. We then tapped the pedicle with a 6.5 millimeter tap. We then removed the tap. We probed inside the tapped pedicle with a ball probe to rule out cortical breaches. We then inserted a 7.5 x 45 and 50 millimeter pedicle screw into the L4 and L5 pedicles bilaterally under fluoroscopic guidance. We then palpated along the medial aspect of the pedicles to rule out cortical breaches. There were none. The nerve roots were not injured. We then connected the unilateral pedicle screws with a lordotic rod. We compressed the construct and secured the rod in place with the caps. We then tightened the caps appropriately. This completed the instrumentation from L4-5 bilaterally.  We now turned our attention to the posterior lateral arthrodesis at L4-5 bilaterally. We used the high-speed drill to decorticate the remainder of the facets, pars, transverse process at L4-5 bilaterally. We then applied a combination of local morselized autograft bone and Zimmer DBM over these decorticated posterior lateral structures. This completed the posterior lateral arthrodesis.  We then obtained hemostasis using bipolar electrocautery. We irrigated the wound out with bacitracin solution. We inspected the thecal sac and nerve roots and noted they were well decompressed. We then removed the retractor.  We injected Exparel . We reapproximated patient's thoracolumbar fascia with interrupted #1 Vicryl suture. We reapproximated patient's subcutaneous tissue with interrupted 2-0 Vicryl suture. The reapproximated patient's skin with Steri-Strips and benzoin. The wound was then coated with bacitracin ointment. A sterile dressing was applied. The drapes were removed. The patient was subsequently returned to the supine position where they were extubated by the anesthesia team. He was then transported to the post anesthesia care unit in stable condition. All sponge  instrument and needle counts were reportedly correct at the end of this case.

## 2019-03-23 LAB — BASIC METABOLIC PANEL
Anion gap: 10 (ref 5–15)
BUN: 6 mg/dL (ref 6–20)
CO2: 27 mmol/L (ref 22–32)
Calcium: 8.3 mg/dL — ABNORMAL LOW (ref 8.9–10.3)
Chloride: 103 mmol/L (ref 98–111)
Creatinine, Ser: 0.61 mg/dL (ref 0.44–1.00)
GFR calc Af Amer: >60 mL/min (ref >60)
GFR calc non Af Amer: >60 mL/min (ref >60)
Glucose, Bld: 177 mg/dL — ABNORMAL HIGH (ref 70–99)
Potassium: 3.9 mmol/L (ref 3.5–5.1)
Sodium: 140 mmol/L (ref 135–145)

## 2019-03-23 LAB — CBC
HCT: 33.3 % — ABNORMAL LOW (ref 36.0–46.0)
Hemoglobin: 11.9 g/dL — ABNORMAL LOW (ref 12.0–15.0)
MCH: 36 pg — ABNORMAL HIGH (ref 26.0–34.0)
MCHC: 35.7 g/dL (ref 30.0–36.0)
MCV: 100.6 fL — ABNORMAL HIGH (ref 80.0–100.0)
Platelets: 223 10*3/uL (ref 150–400)
RBC: 3.31 MIL/uL — ABNORMAL LOW (ref 3.87–5.11)
RDW: 14.6 % (ref 11.5–15.5)
WBC: 16.8 10*3/uL — ABNORMAL HIGH (ref 4.0–10.5)
nRBC: 0 % (ref 0.0–0.2)

## 2019-03-23 LAB — GLUCOSE, CAPILLARY: Glucose-Capillary: 180 mg/dL — ABNORMAL HIGH (ref 70–99)

## 2019-03-23 MED ORDER — PREDNISONE 5 MG PO TABS: 5.0000 mg | ORAL_TABLET | Freq: Every day | ORAL | 0 refills | Status: DC

## 2019-03-23 MED ORDER — OXYCODONE HCL 5 MG PO TABS: 5.0000 mg | ORAL_TABLET | ORAL | 0 refills | Status: DC | PRN

## 2019-03-23 MED ORDER — DOCUSATE SODIUM 100 MG PO CAPS: 100.0000 mg | ORAL_CAPSULE | Freq: Two times a day (BID) | ORAL | 0 refills | Status: DC

## 2019-03-23 MED ORDER — CYCLOBENZAPRINE HCL 10 MG PO TABS: 10.0000 mg | ORAL_TABLET | Freq: Three times a day (TID) | ORAL | 1 refills | Status: DC | PRN

## 2019-03-23 MED FILL — Thrombin For Soln 5000 Unit: CUTANEOUS | Qty: 5000 | Status: AC

## 2019-03-23 NOTE — Progress Notes (Signed)
Physical Therapy Treatment Patient Details Name: Courtney Henderson MRN: [id number] DOB: 1960-10-13 Today's Date: 03/23/2019    History of Present Illness Patient is a 59 y/o female admitted with history of L THA, L5 kyphoplasty, recent tooth extractions, DM and lymphedema in LE's admitted duet o SS and spondylolisthesis now s/p laminotomy/foraminotomies/medial facetectomy bilateral L4-5 and PLIF.    PT Comments    Pt progressing well with post-op mobility. She was able to demonstrate transfers and ambulation with gross supervision to min guard assist and RW for support. Due to tray on walker, pt had difficulty maintaining an upright posture. Pt was educated on precautions, brace application/wearing schedule, appropriate activity progression, and car transfer. Will continue to follow.     Follow Up Recommendations  Home health PT     Equipment Recommendations  None recommended by PT    Recommendations for Other Services       Precautions / Restrictions Precautions Precautions: Fall;Back Precaution Booklet Issued: Yes (comment) Precaution Comments: reviewed with patient, able to recall 3/3 but min cueing to adhere functionally Required Braces or Orthoses: Spinal Brace Spinal Brace: Applied in sitting position;Lumbar corset Restrictions Weight Bearing Restrictions: No    Mobility  Bed Mobility               General bed mobility comments: Pt was received   Transfers Overall transfer level: Needs assistance Equipment used: Rolling walker (2 wheeled) Transfers: Sit to/from Stand Sit to Stand: Supervision         General transfer comment: Supervision for safety and balance, cueing for posture and technique. Pt with a tray attached to her walker so it was difficult for her to step up close enough to the walker to maintain a neutral spine.   Ambulation/Gait Ambulation/Gait assistance: Min Gaffer (Feet): 400 Feet Assistive device: Rolling walker (2  wheeled) Gait Pattern/deviations: Step-to pattern;Step-through pattern;Decreased stride length;Trendelenburg;Shuffle Gait velocity: Decreased Gait velocity interpretation: <1.8 ft/sec, indicate of risk for recurrent falls General Gait Details: L trendelenberg, slow pace, short shuffling steps and increased time for turns   Stairs Stairs: Yes Stairs assistance: Min assist Stair Management: One rail Right;Step to pattern;Forwards Number of Stairs: 4 General stair comments: VC's for sequencing and general safety. Pt typically utilizes walker ascending and descending to the side with family supporting walker. Instructed in safer stair negotiation technique with HHA from family.    Wheelchair Mobility    Modified Rankin (Stroke Patients Only)       Balance Overall balance assessment: Needs assistance Sitting-balance support: Feet supported Sitting balance-Leahy Scale: Good     Standing balance support: Bilateral upper extremity supported;No upper extremity supported;During functional activity Standing balance-Leahy Scale: Poor Standing balance comment: reliant on BUE support dynamically, but able to stand statically during grooming without UE support                            Cognition Arousal/Alertness: Awake/alert Behavior During Therapy: WFL for tasks assessed/performed Overall Cognitive Status: Within Functional Limits for tasks assessed                                        Exercises      General Comments        Pertinent Vitals/Pain Pain Assessment: Faces Faces Pain Scale: Hurts a little bit Pain Location:  back  Pain Descriptors / Indicators: Discomfort;Operative  site guarding Pain Intervention(s): Limited activity within patient's tolerance;Monitored during session;Repositioned    Home Living Family/patient expects to be discharged to:: Private residence Living Arrangements: Spouse/significant other Available Help at Discharge:  Family;Available PRN/intermittently Type of Home: House Home Access: Stairs to enter Entrance Stairs-Rails: Right Home Layout: One level Home Equipment: Walker - 2 wheels;Shower seat;Grab bars - tub/shower;Grab bars - toilet;Hand held shower head;Adaptive equipment      Prior Function Level of Independence: Independent with assistive device(s)      Comments: uses RW for all mobility, limited IADLs; always has supervision for bathing    PT Goals (current goals can now be found in the care plan section) Acute Rehab PT Goals Patient Stated Goal: to return to independent PT Goal Formulation: With patient Time For Goal Achievement: 04/05/19 Potential to Achieve Goals: Good Progress towards PT goals: Progressing toward goals    Frequency    Min 5X/week      PT Plan Current plan remains appropriate    Co-evaluation              AM-PAC PT "6 Clicks" Mobility   Outcome Measure  Help needed turning from your back to your side while in a flat bed without using bedrails?: None Help needed moving from lying on your back to sitting on the side of a flat bed without using bedrails?: None Help needed moving to and from a bed to a chair (including a wheelchair)?: None Help needed standing up from a chair using your arms (e.g., wheelchair or bedside chair)?: None Help needed to walk in hospital room?: None Help needed climbing 3-5 steps with a railing? : A Little 6 Click Score: 23    End of Session Equipment Utilized During Treatment: Gait belt Activity Tolerance: Patient limited by pain Patient left: in bed;with call bell/phone within reach Nurse Communication: Mobility status PT Visit Diagnosis: Other abnormalities of gait and mobility (R26.89);Muscle weakness (generalized) (M62.81);Pain Pain - part of body: (back)     Time: 9485-4627 PT Time Calculation (min) (ACUTE ONLY): 14 min  Charges:  $Gait Training: 8-22 mins                     Conni Slipper, PT, DPT Acute  Rehabilitation Services Pager: [phone] Office: [phone]    Marylynn Pearson 03/23/2019, 1:50 PM

## 2019-03-23 NOTE — Discharge Instructions (Signed)

## 2019-03-23 NOTE — Discharge Summary (Signed)
Physician Discharge Summary  Patient ID: Courtney Henderson MRN: [id number] DOB/AGE: 60-Jun-1962 72 y.o.  Admit date: 03/22/2019 Discharge date: 03/23/2019  Admission Diagnoses: L4-5 spondylolisthesis, spinal stenosis, lumbago, lumbar radiculopathy, neurogenic claudication  Discharge Diagnoses: The same Active Problems:   Spinal stenosis of lumbar region with neurogenic claudication   Discharged Condition: good  Hospital Course: I performed an L4-5 decompression, instrumentation and fusion on the patient on 03/22/2019.  The surgery went well.  The patient's postoperative course was unremarkable.  On postoperative day #1 she requested discharge to home.  She was given written and oral discharge instructions.  All her questions were answered.  Consults: PT, OT Significant Diagnostic Studies: None Treatments: L4-5 decompression, instrumentation and fusion Discharge Exam: Blood pressure 112/68, pulse 79, temperature 98.7 F (37.1 C), temperature source Oral, resp. rate 18, height 5' 2"  (1.575 m), weight 100.7 kg, SpO2 (!) 87 %. The patient is alert and pleasant.  She looks well.  She is moving her lower extremities well.  Disposition: Home  Discharge Instructions    Call MD for:  difficulty breathing, headache or visual disturbances   Complete by: As directed    Call MD for:  extreme fatigue   Complete by: As directed    Call MD for:  hives   Complete by: As directed    Call MD for:  persistant dizziness or light-headedness   Complete by: As directed    Call MD for:  persistant nausea and vomiting   Complete by: As directed    Call MD for:  redness, tenderness, or signs of infection (pain, swelling, redness, odor or green/yellow discharge around incision site)   Complete by: As directed    Call MD for:  severe uncontrolled pain   Complete by: As directed    Call MD for:  temperature >100.4   Complete by: As directed    Diet - low sodium heart healthy   Complete by: As directed    Discharge instructions   Complete by: As directed    Call [phone] for a followup appointment. Take a stool softener while you are using pain medications.   Driving Restrictions   Complete by: As directed    Do not drive for 2 weeks.   Increase activity slowly   Complete by: As directed    Lifting restrictions   Complete by: As directed    Do not lift more than 5 pounds. No excessive bending or twisting.   May shower / Bathe   Complete by: As directed    Remove the dressing for 3 days after surgery.  You may shower, but leave the incision alone.   Remove dressing in 48 hours   Complete by: As directed    Your stitches are under the scan and will dissolve by themselves. The Steri-Strips will fall off after you take a few showers. Do not rub back or pick at the wound, Leave the wound alone.     Allergies as of 03/23/2019      Reactions   Amoxicillin Anaphylaxis   Morphine And Related Swelling   Penicillins Swelling   Did it involve swelling of the face/tongue/throat, SOB, or low BP? Unknown Did it involve sudden or severe rash/hives, skin peeling, or any reaction on the inside of your mouth or nose? Unknown Did you need to seek medical attention at a hospital or doctor's office? Unknown When did it last happen?unkn  If all above answers are "NO", may proceed with cephalosporin use.  Medication List    STOP taking these medications   acetaminophen 500 MG tablet Commonly known as: TYLENOL   HYDROcodone-acetaminophen 5-325 MG tablet Commonly known as: Norco   ibuprofen 200 MG tablet Commonly known as: ADVIL     TAKE these medications   Bayer Contour Next Test test strip Generic drug: glucose blood 1 each by Other route in the morning and at bedtime.   BENADRYL PO Take 1 tablet by mouth at bedtime.   Breo Ellipta 100-25 MCG/INH Aepb Generic drug: fluticasone furoate-vilanterol Inhale 1 puff into the lungs daily.   CONTOUR NEXT EZ MONITOR w/Device Kit 1  Device by Does not apply route in the morning and at bedtime.   cyclobenzaprine 10 MG tablet Commonly known as: FLEXERIL Take 1 tablet (10 mg total) by mouth 3 (three) times daily as needed for muscle spasms.   docusate sodium 100 MG capsule Commonly known as: COLACE Take 1 capsule (100 mg total) by mouth 2 (two) times daily.   furosemide 20 MG tablet Commonly known as: LASIX Take 20 mg by mouth daily as needed for edema.   gabapentin 300 MG capsule Commonly known as: NEURONTIN Take 2 capsules (600 mg total) by mouth 2 (two) times daily. What changed: when to take this   Klor-Con 10 10 MEQ tablet Generic drug: potassium chloride Take 10 mEq by mouth daily as needed (takes with furosemide).   latanoprost 0.005 % ophthalmic solution Commonly known as: XALATAN Place 1 drop into both eyes at bedtime.   metFORMIN 500 MG tablet Commonly known as: GLUCOPHAGE Take 500 mg by mouth 2 (two) times daily.   Microlet Lancets Misc 1 Stick by Other route in the morning and at bedtime.   oxyCODONE 5 MG immediate release tablet Commonly known as: Oxy IR/ROXICODONE Take 1 tablet (5 mg total) by mouth every 4 (four) hours as needed for moderate pain ((score 4 to 6)). What changed:   when to take this  reasons to take this   predniSONE 5 MG tablet Commonly known as: DELTASONE Take 1 tablet (5 mg total) by mouth daily with breakfast. What changed:   medication strength  how much to take        Signed: Ophelia Charter 03/23/2019, 9:07 AM

## 2019-03-23 NOTE — Evaluation (Addendum)
Occupational Therapy Evaluation Patient Details Name: Courtney Henderson MRN: [id number] DOB: February 27, 1960 Today's Date: 03/23/2019    History of Present Illness Patient is a 59 y/o female admitted with history of L THA, L5 kyphoplasty, recent tooth extractions, DM and lymphedema in LE's admitted duet o SS and spondylolisthesis now s/p laminotomy/foraminotomies/medial facetectomy bilateral L4-5 and PLIF.   Clinical Impression   PTA patient independent using RW for mobility and AE for ADLs, minimal IADLs.  She reports having support of family as needed at discharge.  She was admitted for above and limited by problem list below, including pain, back precaution adherence and impaired balance.  Educated on brace mgmt and wear schedule, back precautions, ADL compensatory techniques and recommendations.  She is able to recall back precautions, but functionally requires min cueing to adhere.  Able to complete LB ADLs with min assist (usually uses reacher at home), UB dressing (brace mgmt) with min assist, grooming at sink with supervision, toileting and transfers with supervision. She will benefit from further OT services while admitted to optimize safety and precaution adherence, but anticipate no further needs after dc home.     Follow Up Recommendations  No OT follow up;Supervision - Intermittent    Equipment Recommendations  None recommended by OT    Recommendations for Other Services       Precautions / Restrictions Precautions Precautions: Fall;Back Precaution Booklet Issued: Yes (comment) Precaution Comments: reviewed with patient, able to recall 3/3 but min cueing to adhere functionally Required Braces or Orthoses: Spinal Brace Spinal Brace: Applied in sitting position;Lumbar corset Restrictions Weight Bearing Restrictions: No      Mobility Bed Mobility Overal bed mobility: Needs Assistance Bed Mobility: Rolling;Sidelying to Sit Rolling: Supervision Sidelying to sit: Supervision        General bed mobility comments: cueing to ensure correct log roll technique   Transfers Overall transfer level: Needs assistance Equipment used: Rolling walker (2 wheeled) Transfers: Sit to/from Stand Sit to Stand: Supervision         General transfer comment: supervision for safety and balance, cueing for posture and technique    Balance Overall balance assessment: Needs assistance Sitting-balance support: Feet supported Sitting balance-Leahy Scale: Good     Standing balance support: Bilateral upper extremity supported;No upper extremity supported;During functional activity Standing balance-Leahy Scale: Poor Standing balance comment: reliant on BUE support dynamically, but able to stand statically during grooming without UE support                           ADL either performed or assessed with clinical judgement   ADL Overall ADL's : Needs assistance/impaired     Grooming: Supervision/safety;Standing Grooming Details (indicate cue type and reason): cueing for back precaution adherence and use of techniques  Upper Body Bathing: Supervision/ safety;Sitting   Lower Body Bathing: Sit to/from stand;Minimal assistance   Upper Body Dressing : Minimal assistance;Sitting Upper Body Dressing Details (indicate cue type and reason): assist required for brace mgmt  Lower Body Dressing: Minimal assistance;Sit to/from stand Lower Body Dressing Details (indicate cue type and reason): requires assist to thread pants today, uses reacher typically at home; educated on side sitting on EOB for wraps pt completes for BLEs at home  Toilet Transfer: Supervision/safety;Ambulation;RW;Grab bars Toilet Transfer Details (indicate cue type and reason): min cueing for back precaution adherence Toileting- Clothing Manipulation and Hygiene: Supervision/safety;Sit to/from stand;Cueing for back precautions       Functional mobility during ADLs: Supervision/safety;Rolling walker General  ADL Comments: pt educated on brace mgmt, back precautions, ADL compensatory techniques and recommendations      Vision         Perception     Praxis      Pertinent Vitals/Pain Pain Assessment: Faces Faces Pain Scale: Hurts a little bit Pain Location:  back  Pain Descriptors / Indicators: Discomfort;Operative site guarding Pain Intervention(s): Monitored during session;Repositioned     Hand Dominance     Extremity/Trunk Assessment Upper Extremity Assessment Upper Extremity Assessment: Overall WFL for tasks assessed   Lower Extremity Assessment Lower Extremity Assessment: Defer to PT evaluation   Cervical / Trunk Assessment Cervical / Trunk Assessment: Other exceptions Cervical / Trunk Exceptions: s/p back surgery    Communication Communication Communication: No difficulties   Cognition Arousal/Alertness: Awake/alert Behavior During Therapy: WFL for tasks assessed/performed Overall Cognitive Status: Within Functional Limits for tasks assessed                                     General Comments  pt reports having to don wraps daily to BLEs, problem solved through techniques to adhere to precautions and reach feet--most successful with lateral sitting on EOB    Exercises     Shoulder Instructions      Home Living Family/patient expects to be discharged to:: Private residence Living Arrangements: Spouse/significant other;Children;Non-relatives/Friends Available Help at Discharge: Family;Available PRN/intermittently Type of Home: House Home Access: Stairs to enter [employer] of Steps: 4 Entrance Stairs-Rails: Right Home Layout: One level     Bathroom Shower/Tub: Teacher, early years/pre: Handicapped height     Home Equipment: Environmental consultant - 2 wheels;Shower seat;Grab bars - tub/shower;Grab bars - toilet;Hand held shower head;Adaptive equipment Adaptive Equipment: Reacher;Sock aid;Long-handled shoe horn;Long-handled sponge         Prior Functioning/Environment Level of Independence: Independent with assistive device(s)        Comments: uses RW for all mobility, limited IADLs; always has supervision for bathing         OT Problem List: Impaired balance (sitting and/or standing);Pain;Decreased knowledge of precautions;Decreased knowledge of use of DME or AE;Decreased safety awareness;Decreased strength      OT Treatment/Interventions: Self-care/ADL training;DME and/or AE instruction;Therapeutic activities;Patient/family education;Balance training    OT Goals(Current goals can be found in the care plan section) Acute Rehab OT Goals Patient Stated Goal: to return to independent OT Goal Formulation: With patient Time For Goal Achievement: 04/06/19 Potential to Achieve Goals: Good ADL Goals Pt Will Perform Lower Body Dressing: with modified independence;sit to/from stand;with adaptive equipment Pt Will Transfer to Toilet: with modified independence;ambulating;regular height toilet Pt Will Perform Tub/Shower Transfer: Shower transfer;ambulating;with modified independence;with supervision;Tub transfer;rolling walker;shower seat Additional ADL Goal #1: Pt will demonstrate use of brace with independence, following back precautions during ADL routine without cueing.  OT Frequency: Min 2X/week   Barriers to D/C:            Co-evaluation              AM-PAC OT "6 Clicks" Daily Activity     Outcome Measure Help from another person eating meals?: None Help from another person taking care of personal grooming?: A Little Help from another person toileting, which includes using toliet, bedpan, or urinal?: A Little Help from another person bathing (including washing, rinsing, drying)?: A Little Help from another person to put on and taking off regular upper body clothing?: A Little Help  from another person to put on and taking off regular lower body clothing?: A Little 6 Click Score: 19   End of Session  Equipment Utilized During Treatment: Rolling walker;Back brace Nurse Communication: Mobility status  Activity Tolerance: Patient tolerated treatment well Patient left: in chair;with call bell/phone within reach  OT Visit Diagnosis: Other abnormalities of gait and mobility (R26.89);Pain Pain - part of body: (back)                Time: 3875-6433 OT Time Calculation (min): 32 min Charges:  OT General Charges $OT Visit: 1 Visit OT Evaluation $OT Eval Moderate Complexity: 1 Mod OT Treatments $Self Care/Home Management : 8-22 mins  Barry Brunner, OT Acute Rehabilitation Services Pager [phone] Office [phone]    Chancy Milroy 03/23/2019, 9:40 AM

## 2019-03-23 NOTE — Progress Notes (Signed)
Patient is discharged from room 3C04 at this time. Alert and in stable condition. IV site d/c'd and instructions read to patient with understanding verbalized and all questions answered. Left unit via wheelchair with all belongings at side. 

## 2019-03-27 MED FILL — Sodium Chloride IV Soln 0.9%: INTRAVENOUS | Qty: 1000 | Status: AC

## 2019-03-27 MED FILL — Heparin Sodium (Porcine) Inj 1000 Unit/ML: INTRAMUSCULAR | Qty: 30 | Status: AC

## 2019-04-03 ENCOUNTER — Ambulatory Visit: Payer: PRIVATE HEALTH INSURANCE | Admitting: Podiatry

## 2019-04-07 ENCOUNTER — Ambulatory Visit (INDEPENDENT_AMBULATORY_CARE_PROVIDER_SITE_OTHER): Payer: PRIVATE HEALTH INSURANCE | Admitting: Vascular Surgery

## 2019-04-07 ENCOUNTER — Other Ambulatory Visit: Payer: Self-pay

## 2019-04-07 ENCOUNTER — Encounter (INDEPENDENT_AMBULATORY_CARE_PROVIDER_SITE_OTHER): Payer: Self-pay | Admitting: Vascular Surgery

## 2019-04-07 VITALS — BP 144/79 | HR 108 | Resp 16 | Wt 192.0 lb

## 2019-04-07 DIAGNOSIS — E1142 Type 2 diabetes mellitus with diabetic polyneuropathy: Secondary | ICD-10-CM

## 2019-04-07 DIAGNOSIS — R600 Localized edema: Secondary | ICD-10-CM | POA: Diagnosis not present

## 2019-04-07 DIAGNOSIS — M4727 Other spondylosis with radiculopathy, lumbosacral region: Secondary | ICD-10-CM | POA: Diagnosis not present

## 2019-04-07 NOTE — Progress Notes (Signed)
MRN : [id number]  Courtney Henderson is a 59 y.o. (1960/12/31) female who presents with chief complaint of  Chief Complaint  Patient presents with  . Follow-up    ref Masoud ble swelling  .  History of Present Illness: Patient is referred back for evaluation of worsening lower extremity swelling by her primary care physician Dr. Lavera Guise.  This has gotten significantly worse after her back surgery a couple of months ago and decreased immobility.  She has had weeping from her legs.  She still has several small superficial ulcerations on the left calf and lower leg.  The right leg is discolored and somewhat red and quite swollen but there are no open ulcerations.  Her legs are very painful and heavy.  She has noticed with stopping some of her pain medications and trying to be more active her swelling is gotten a little bit better.  She also uses the compression Velcro system although she has stopped that recently.  She has been told previously she had lymphedema.  She had an arterial assessment about a year ago which was normal.  She did not return for venous duplex.  No fevers or chills.  She is scheduled to see her back surgeon next week and follow-up  Current Outpatient Medications  Medication Sig Dispense Refill  . BREO ELLIPTA 100-25 MCG/INH AEPB Inhale 1 puff into the lungs daily.   7  . diphenhydrAMINE HCl (BENADRYL PO) Take 1 tablet by mouth at bedtime.    Marland Kitchen doxycycline (VIBRAMYCIN) 100 MG capsule Take 100 mg by mouth daily.    . furosemide (LASIX) 20 MG tablet Take 20 mg by mouth daily as needed for edema.   5  . ibuprofen (ADVIL) 200 MG tablet Take 200 mg by mouth every 6 (six) hours as needed.    Marland Kitchen KLOR-CON 10 10 MEQ tablet Take 10 mEq by mouth daily as needed (takes with furosemide).   4  . latanoprost (XALATAN) 0.005 % ophthalmic solution Place 1 drop into both eyes at bedtime.    . metFORMIN (GLUCOPHAGE) 500 MG tablet Take 500 mg by mouth 2 (two) times daily.  12  . predniSONE  (DELTASONE) 5 MG tablet Take 1 tablet (5 mg total) by mouth daily with breakfast. 30 tablet 0  . Blood Glucose Monitoring Suppl (CONTOUR NEXT EZ MONITOR) w/Device KIT 1 Device by Does not apply route in the morning and at bedtime.   0  . cyclobenzaprine (FLEXERIL) 10 MG tablet Take 1 tablet (10 mg total) by mouth 3 (three) times daily as needed for muscle spasms. (Patient not taking: Reported on 04/07/2019) 50 tablet 1  . docusate sodium (COLACE) 100 MG capsule Take 1 capsule (100 mg total) by mouth 2 (two) times daily. (Patient not taking: Reported on 04/07/2019) 60 capsule 0  . gabapentin (NEURONTIN) 300 MG capsule Take 2 capsules (600 mg total) by mouth 2 (two) times daily. (Patient not taking: Reported on 04/07/2019) 120 capsule 5  . glucose blood (BAYER CONTOUR NEXT TEST) test strip 1 each by Other route in the morning and at bedtime.   6  . MICROLET LANCETS MISC 1 Stick by Other route in the morning and at bedtime.   6  . oxyCODONE (OXY IR/ROXICODONE) 5 MG immediate release tablet Take 1 tablet (5 mg total) by mouth every 4 (four) hours as needed for moderate pain ((score 4 to 6)). (Patient not taking: Reported on 04/07/2019) 30 tablet 0   No current facility-administered medications for  this visit.    Past Medical History:  Diagnosis Date  . Complication of anesthesia    long time to wake up, headache  . Diabetes mellitus without complication ([place])   . Lymphedema    in legs/feet  . Neuromuscular disorder [facility])     Past Surgical History:  Procedure Laterality Date  . CATARACT EXTRACTION, BILATERAL    . JOINT REPLACEMENT Left    hip  . KYPHOPLASTY N/A 01/05/2019   Procedure: KYPHOPLASTY L5;  Surgeon: Hessie Knows, MD;  Location: [facility] ORS;  Service: Orthopedics;  Laterality: N/A;  . MULTIPLE TOOTH EXTRACTIONS  06/2018   2-3 weeks ago; 10 teeth  . TUBAL LIGATION       Social History   Tobacco Use  . Smoking status: Current Every Day Smoker    Packs/day: 1.50    Years: 40.00     Pack years: 60.00    Types: Cigarettes  . Smokeless tobacco: Never Used  Substance Use Topics  . Alcohol use: Not Currently    Alcohol/week: 0.0 standard drinks  . Drug use: Never    Family History  Problem Relation Age of Onset  . Heart disease Maternal Grandfather   No bleeding disorders, clotting disorders, aneurysms, or porphyria  Allergies  Allergen Reactions  . Amoxicillin Anaphylaxis  . Morphine And Related Swelling  . Penicillins Swelling    Did it involve swelling of the face/tongue/throat, SOB, or low BP? Unknown Did it involve sudden or severe rash/hives, skin peeling, or any reaction on the inside of your mouth or nose? Unknown Did you need to seek medical attention at a hospital or doctor's office? Unknown When did it last happen?unkn  If all above answers are "NO", may proceed with cephalosporin use.      REVIEW OF SYSTEMS (Negative unless checked)  Constitutional: [] Weight loss  [] Fever  [] Chills Cardiac: [] Chest pain   [] Chest pressure   [] Palpitations   [] Shortness of breath when laying flat   [] Shortness of breath at rest   [x] Shortness of breath with exertion. Vascular:  [x] Pain in legs with walking   [x] Pain in legs at rest   [] Pain in legs when laying flat   [] Claudication   [] Pain in feet when walking  [] Pain in feet at rest  [] Pain in feet when laying flat   [] History of DVT   [] Phlebitis   [x] Swelling in legs   [] Varicose veins   [x] Non-healing ulcers Pulmonary:   [] Uses home oxygen   [] Productive cough   [] Hemoptysis   [] Wheeze  [] COPD   [] Asthma Neurologic:  [] Dizziness  [] Blackouts   [] Seizures   [] History of stroke   [] History of TIA  [] Aphasia   [] Temporary blindness   [] Dysphagia   [] Weakness or numbness in arms   [] Weakness or numbness in legs Musculoskeletal:  [] Arthritis   [] Joint swelling   [x] Joint pain   [x] Low back pain Hematologic:  [] Easy bruising  [] Easy bleeding   [] Hypercoagulable state   [] Anemic   Gastrointestinal:  [] Blood in  stool   [] Vomiting blood  [] Gastroesophageal reflux/heartburn   [] Abdominal pain Genitourinary:  [] Chronic kidney disease   [] Difficult urination  [] Frequent urination  [] Burning with urination   [] Hematuria Skin:  [] Rashes   [x] Ulcers   [x] Wounds Psychological:  [] History of anxiety   []  History of major depression.  Physical Examination  BP (!) 144/79 (BP Location: Right Arm)   Pulse (!) 108   Resp 16   Wt 192 lb (87.1 kg)   LMP  (LMP Unknown)  BMI 35.12 kg/m  Gen:  WD/WN, NAD.  Appears older than stated age Head: [place]/AT, No temporalis wasting. Ear/Nose/Throat: Hearing grossly intact, nares w/o erythema or drainage Eyes: Conjunctiva clear. Sclera non-icteric Neck: Supple.  Trachea midline Pulmonary:  Good air movement, no use of accessory muscles.  Cardiac: RRR, no JVD Vascular:  Vessel Right Left  Radial Palpable Palpable                          PT  not palpable  not palpable  DP  not palpable  not palpable   Gastrointestinal: soft, non-tender/non-distended. No guarding/reflex.  Musculoskeletal: M/S 5/5 throughout.  No deformity or atrophy. 3+ BLE edema. Neurologic: Sensation grossly intact in extremities.  Symmetrical.  Speech is fluent.  Psychiatric: Judgment intact, Mood & affect appropriate for pt's clinical situation. Dermatologic: Several small superficial ulcerations on the left calf and lower leg.  Moderate stasis dermatitis changes present bilaterally       Labs Recent Results (from the past 2160 hour(s))  I-STAT creatinine     Status: None   Collection Time: 03/09/19  3:54 PM  Result Value Ref Range   Creatinine, Ser 0.50 0.44 - 1.00 mg/dL  Glucose, capillary     Status: Abnormal   Collection Time: 03/16/19  9:12 AM  Result Value Ref Range   Glucose-Capillary 329 (H) 70 - 99 mg/dL    Comment: Glucose reference range applies only to samples taken after fasting for at least 8 hours.  Hemoglobin A1c     Status: Abnormal   Collection Time: 03/16/19  10:05 AM  Result Value Ref Range   Hgb A1c MFr Bld 6.7 (H) 4.8 - 5.6 %    Comment: (NOTE) Pre diabetes:          5.7%-6.4% Diabetes:              >6.4% Glycemic control for   <7.0% adults with diabetes    Mean Plasma Glucose 145.59 mg/dL    Comment: Performed at [facility] [address] [place], [place] [address]  Basic metabolic panel     Status: Abnormal   Collection Time: 03/16/19 10:05 AM  Result Value Ref Range   Sodium 138 135 - 145 mmol/L   Potassium 4.1 3.5 - 5.1 mmol/L   Chloride 100 98 - 111 mmol/L   CO2 23 22 - 32 mmol/L   Glucose, Bld 311 (H) 70 - 99 mg/dL    Comment: Glucose reference range applies only to samples taken after fasting for at least 8 hours.   BUN 10 6 - 20 mg/dL   Creatinine, Ser 0.61 0.44 - 1.00 mg/dL   Calcium 9.2 8.9 - 10.3 mg/dL   GFR calc non Af Amer >60 >60 mL/min   GFR calc Af Amer >60 >60 mL/min   Anion gap 15 5 - 15    Comment: Performed at [facility] [address], [place], [place] [address]  CBC     Status: Abnormal   Collection Time: 03/16/19 10:05 AM  Result Value Ref Range   WBC 16.4 (H) 4.0 - 10.5 K/uL   RBC 4.34 3.87 - 5.11 MIL/uL   Hemoglobin 15.3 (H) 12.0 - 15.0 g/dL   HCT 44.6 36.0 - 46.0 %   MCV 102.8 (H) 80.0 - 100.0 fL   MCH 35.3 (H) 26.0 - 34.0 pg   MCHC 34.3 30.0 - 36.0 g/dL   RDW 14.8 11.5 - 15.5 %  Platelets 237 150 - 400 K/uL   nRBC 0.0 0.0 - 0.2 %    Comment: Performed at [facility], [place] [address] [place], [place] [address]  Surgical pcr screen     Status: Abnormal   Collection Time: 03/16/19 10:06 AM   Specimen: Nasal Mucosa; Nasal Swab  Result Value Ref Range   MRSA, PCR NEGATIVE NEGATIVE   Staphylococcus aureus POSITIVE (A) NEGATIVE    Comment: (NOTE) The Xpert SA Assay (FDA approved for NASAL specimens in patients 64 years of age and older), is one component of a comprehensive surveillance program. It is not intended to diagnose infection nor to guide or monitor  treatment. Performed at [facility], [place] [address], [place], [place] [address]   Type and screen     Status: None   Collection Time: 03/16/19 10:14 AM  Result Value Ref Range   ABO/RH(D) O NEG    Antibody Screen NEG    Sample Expiration 03/30/2019,2359    Extend sample reason NO TRANSFUSIONS OR PREGNANCY IN THE PAST 3 MONTHS    Weak D      POS Performed at [facility], [place] [address] [place], [place] [address]   SARS CORONAVIRUS 2 (TAT 6-24 HRS) Nasopharyngeal Nasopharyngeal Swab     Status: None   Collection Time: 03/20/19  9:36 AM   Specimen: Nasopharyngeal Swab  Result Value Ref Range   SARS Coronavirus 2 NEGATIVE NEGATIVE    Comment: (NOTE) SARS-CoV-2 target nucleic acids are NOT DETECTED. The SARS-CoV-2 RNA is generally detectable in upper and lower respiratory specimens during the acute phase of infection. Negative results do not preclude SARS-CoV-2 infection, do not rule out co-infections with other pathogens, and should not be used as the sole basis for treatment or other patient management decisions. Negative results must be combined with clinical observations, patient history, and epidemiological information. The expected result is Negative. Fact Sheet for Patients: SugarRoll.be Fact Sheet for Healthcare Providers: https://www.woods-mathews.com/ This test is not yet approved or cleared by the [place] FDA and  has been authorized for detection and/or diagnosis of SARS-CoV-2 by FDA under an Emergency Use Authorization (EUA). This EUA will remain  in effect (meaning this test can be used) for the duration of the COVID-19 declaration under Section 56 4(b)(1) of the Act, 21 U.S.C. section 360bbb-3(b)(1), unless the authorization is terminated or revoked sooner. Performed at [facility], [place] [address] [place], [place] [address]   Glucose, capillary     Status: Abnormal   Collection Time: 03/22/19   6:45 AM  Result Value Ref Range   Glucose-Capillary 188 (H) 70 - 99 mg/dL    Comment: Glucose reference range applies only to samples taken after fasting for at least 8 hours.  Type and screen [place]     Status: None   Collection Time: 03/22/19  6:58 AM  Result Value Ref Range   ABO/RH(D) O NEG    Antibody Screen NEG    Sample Expiration      03/25/2019,2359 Performed at [facility], [place] [address], [place], [place] [address]   Glucose, capillary     Status: Abnormal   Collection Time: 03/22/19  9:29 AM  Result Value Ref Range   Glucose-Capillary 164 (H) 70 - 99 mg/dL    Comment: Glucose reference range applies only to samples taken after fasting for at least 8 hours.  Glucose, capillary     Status: Abnormal  Collection Time: 03/22/19 10:27 AM  Result Value Ref Range   Glucose-Capillary 198 (H) 70 - 99 mg/dL    Comment: Glucose reference range applies only to samples taken after fasting for at least 8 hours.  Glucose, capillary     Status: Abnormal   Collection Time: 03/22/19 11:25 AM  Result Value Ref Range   Glucose-Capillary 127 (H) 70 - 99 mg/dL    Comment: Glucose reference range applies only to samples taken after fasting for at least 8 hours.  Glucose, capillary     Status: Abnormal   Collection Time: 03/22/19 12:22 PM  Result Value Ref Range   Glucose-Capillary 153 (H) 70 - 99 mg/dL    Comment: Glucose reference range applies only to samples taken after fasting for at least 8 hours.  Glucose, capillary     Status: Abnormal   Collection Time: 03/22/19 12:42 PM  Result Value Ref Range   Glucose-Capillary 129 (H) 70 - 99 mg/dL    Comment: Glucose reference range applies only to samples taken after fasting for at least 8 hours.  Glucose, capillary     Status: Abnormal   Collection Time: 03/22/19  1:24 PM  Result Value Ref Range   Glucose-Capillary 113 (H) 70 - 99 mg/dL    Comment: Glucose reference range applies only to samples taken after  fasting for at least 8 hours.  Glucose, capillary     Status: Abnormal   Collection Time: 03/22/19  2:24 PM  Result Value Ref Range   Glucose-Capillary 137 (H) 70 - 99 mg/dL    Comment: Glucose reference range applies only to samples taken after fasting for at least 8 hours.  Glucose, capillary     Status: Abnormal   Collection Time: 03/22/19  4:24 PM  Result Value Ref Range   Glucose-Capillary 218 (H) 70 - 99 mg/dL    Comment: Glucose reference range applies only to samples taken after fasting for at least 8 hours.   Comment 1 Notify RN    Comment 2 Document in Chart   Glucose, capillary     Status: Abnormal   Collection Time: 03/22/19  9:41 PM  Result Value Ref Range   Glucose-Capillary 238 (H) 70 - 99 mg/dL    Comment: Glucose reference range applies only to samples taken after fasting for at least 8 hours.   Comment 1 Notify RN    Comment 2 Document in Chart   CBC     Status: Abnormal   Collection Time: 03/23/19  4:49 AM  Result Value Ref Range   WBC 16.8 (H) 4.0 - 10.5 K/uL   RBC 3.31 (L) 3.87 - 5.11 MIL/uL   Hemoglobin 11.9 (L) 12.0 - 15.0 g/dL   HCT 33.3 (L) 36.0 - 46.0 %   MCV 100.6 (H) 80.0 - 100.0 fL   MCH 36.0 (H) 26.0 - 34.0 pg   MCHC 35.7 30.0 - 36.0 g/dL   RDW 14.6 11.5 - 15.5 %   Platelets 223 150 - 400 K/uL   nRBC 0.0 0.0 - 0.2 %    Comment: Performed at [facility], [place] [address] [place],  [address]  Basic Metabolic Panel     Status: Abnormal   Collection Time: 03/23/19  4:49 AM  Result Value Ref Range   Sodium 140 135 - 145 mmol/L   Potassium 3.9 3.5 - 5.1 mmol/L   Chloride 103 98 - 111 mmol/L   CO2 27 22 - 32 mmol/L   Glucose, Bld 177 (  H) 70 - 99 mg/dL    Comment: Glucose reference range applies only to samples taken after fasting for at least 8 hours.   BUN 6 6 - 20 mg/dL   Creatinine, Ser 0.61 0.44 - 1.00 mg/dL   Calcium 8.3 (L) 8.9 - 10.3 mg/dL   GFR calc non Af Amer >60 >60 mL/min   GFR calc Af Amer >60 >60 mL/min   Anion gap 10 5  - 15    Comment: Performed at [facility] [address] [place], [place] [address]  Glucose, capillary     Status: Abnormal   Collection Time: 03/23/19  6:23 AM  Result Value Ref Range   Glucose-Capillary 180 (H) 70 - 99 mg/dL    Comment: Glucose reference range applies only to samples taken after fasting for at least 8 hours.   Comment 1 Notify RN    Comment 2 Document in Chart     Radiology DG Lumbar Spine 2-3 Views  Result Date: 03/22/2019 CLINICAL DATA:  L4-5 posterior fusion EXAM: LUMBAR SPINE - 2-3 VIEW; DG C-ARM 1-60 MIN COMPARISON:  MRI 03/09/2019 FINDINGS: Two intraoperative spot images demonstrate changes of posterior fusion at L4-5. Prior vertebroplasty at L5. IMPRESSION: Posterior fusion L4-5.  No visible complicating feature. Electronically Signed   By: Rolm Baptise M.D.   On: 03/22/2019 12:17   MR Lumbar Spine W Wo Contrast  Result Date: 03/09/2019 CLINICAL DATA:  Low back pain with bilateral hip and leg pain. EXAM: MRI LUMBAR SPINE WITHOUT AND WITH CONTRAST TECHNIQUE: Multiplanar and multiecho pulse sequences of the lumbar spine were obtained without and with intravenous contrast. CONTRAST:  57m GADAVIST GADOBUTROL 1 MMOL/ML IV SOLN COMPARISON:  MRI of the lumbar spine December 30, 2018 FINDINGS: Segmentation:  Standard. Alignment: There is a grade 1 anterolisthesis of L4 over L5 related to facet degeneration, unchanged from prior. Small retrolisthesis of L1 over L2, L2 over L3 are again noted. Vertebrae: Status post kyphoplasty of L5 with small amount of cement within the L4-5 disc space. Degenerative endplate changes are noted from T11-12 through L2-3. No evidence of discitis/osteomyelitis are suspicious bone lesion. Conus medullaris and cauda equina: Conus extends to the L1 level. Conus and cauda equina appear normal. Paraspinal and other soft tissues: Atrophy of the paraspinal musculature is noted, particularly in the lumbosacral region. Disc levels: T11-T12: Posterior  disc protrusion causing effacement of the anterior CSF space and resulting in mild spinal canal stenosis. There is no neural foraminal narrowing. T12-L1: Disc bulge, facet degenerative changes and ligamentum flavum redundancy without significant spinal canal or neural foraminal stenosis. L1-L2: Disc bulge, facet degenerative changes and ligamentum flavum redundancy resulting in mild spinal canal stenosis, moderate right and mild left neural foraminal narrowing. L2-3: Disc bulge, facet degenerative changes and ligamentum flavum redundancy resulting in mild spinal canal stenosis and moderate bilateral neural foraminal narrowing. L3-4: Left asymmetric disc bulge, facet degenerative changes and ligamentum flavum redundancy resulting in mild spinal canal stenosis, narrowing of the left subarticular zone and moderate left neural foraminal narrowing. L4-5: Disc bulge/uncovering of the disc, prominent facet degenerative changes and ligamentum flavum hypertrophy resulting in severe spinal canal stenosis and severe bilateral neural foraminal narrowing. L5-S1: Shallow disc bulge and facet degenerative changes without significant spinal canal or neural foraminal stenosis. Edema and contrast enhancement is noted in the inter spinous ligament at L3-4 and L4-5. No significant change in spinal canal or neural foraminal stenosis when compared to prior study. IMPRESSION: 1. Overall unchanged multilevel degenerative  changes of the lumbar spine, worst at L4-L5 where there is grade 1 anterolisthesis, severe spinal canal stenosis, and severe bilateral neural foraminal narrowing. 2. Edema and contrast enhancement in the inter spinous ligament at L3-4 and L4-5. Electronically Signed   By: Pedro Earls M.D.   On: 03/09/2019 16:49   DG Lumbar Spine 1 View  Result Date: 03/22/2019 CLINICAL DATA:  Elective lumbar surgery. EXAM: LUMBAR SPINE - 1 VIEW COMPARISON:  March 09, 2019. FINDINGS: Single intraoperative cross-table  lateral projection is obtained of the lumbar spine. This demonstrates a surgical probe directed toward the posterior elements of L5. IMPRESSION: Surgical localization as described above. Electronically Signed   By: Marijo Conception M.D.   On: 03/22/2019 10:54   DG C-Arm 1-60 Min  Result Date: 03/22/2019 CLINICAL DATA:  L4-5 posterior fusion EXAM: LUMBAR SPINE - 2-3 VIEW; DG C-ARM 1-60 MIN COMPARISON:  MRI 03/09/2019 FINDINGS: Two intraoperative spot images demonstrate changes of posterior fusion at L4-5. Prior vertebroplasty at L5. IMPRESSION: Posterior fusion L4-5.  No visible complicating feature. Electronically Signed   By: Rolm Baptise M.D.   On: 03/22/2019 12:17    Assessment/Plan  Diabetic polyneuropathy associated with type 2 diabetes mellitus (HCC) blood glucose control important in reducing the progression of atherosclerotic disease. Also, involved in wound healing. On appropriate medications.   Lumbosacral spondylosis with radiculopathy Her recent back surgery is markedly limited her mobility which is certainly worsen her swelling  Bilateral lower extremity edema The patient has severe bilateral lower extremity edema with skin changes and some ulcerations on the left leg.  I would recommend that she go in [employer] boots today, but she does not want to have that done.  She is afraid it will hurt her back more.  She will continue to use her Velcro compression system.  She will elevate her legs and try to increase her activity as tolerated.  At this point, she needs to have a venous reflux study done to assess for significant venous reflux which could very well be present and exacerbating the duration.  She has been diagnosed with lymphedema and does clearly have at least stage II if not stage III lymphedema.  A lymphedema pump would be an excellent adjuvant therapy to help with the swelling as well.  Overall, compliance with compression, elevation, and activity are going to be of paramount  importance of keeping her swelling down.  If she is unwilling to go in [employer] today, she knows that if drainage worsens or skin irritation appears to get worse that that would be necessary.  We will see her back in a few weeks with venous reflux study.    Leotis Pain, MD  04/07/2019 12:26 PM    This note was created with Dragon medical transcription system.  Any errors from dictation are purely unintentional

## 2019-04-07 NOTE — Assessment & Plan Note (Signed)
Her recent back surgery is markedly limited her mobility which is certainly worsen her swelling

## 2019-04-07 NOTE — Assessment & Plan Note (Signed)
blood glucose control important in reducing the progression of atherosclerotic disease. Also, involved in wound healing. On appropriate medications.  

## 2019-04-07 NOTE — Patient Instructions (Signed)

## 2019-04-07 NOTE — Assessment & Plan Note (Signed)
The patient has severe bilateral lower extremity edema with skin changes and some ulcerations on the left leg.  I would recommend that she go in [employer] boots today, but she does not want to have that done.  She is afraid it will hurt her back more.  She will continue to use her Velcro compression system.  She will elevate her legs and try to increase her activity as tolerated.  At this point, she needs to have a venous reflux study done to assess for significant venous reflux which could very well be present and exacerbating the duration.  She has been diagnosed with lymphedema and does clearly have at least stage II if not stage III lymphedema.  A lymphedema pump would be an excellent adjuvant therapy to help with the swelling as well.  Overall, compliance with compression, elevation, and activity are going to be of paramount importance of keeping her swelling down.  If she is unwilling to go in [employer] today, she knows that if drainage worsens or skin irritation appears to get worse that that would be necessary.  We will see her back in a few weeks with venous reflux study.

## 2019-04-26 ENCOUNTER — Ambulatory Visit: Payer: PRIVATE HEALTH INSURANCE | Admitting: Podiatry

## 2019-04-27 ENCOUNTER — Ambulatory Visit (INDEPENDENT_AMBULATORY_CARE_PROVIDER_SITE_OTHER): Payer: PRIVATE HEALTH INSURANCE | Admitting: Nurse Practitioner

## 2019-04-27 ENCOUNTER — Ambulatory Visit (INDEPENDENT_AMBULATORY_CARE_PROVIDER_SITE_OTHER): Payer: PRIVATE HEALTH INSURANCE

## 2019-04-27 ENCOUNTER — Encounter (INDEPENDENT_AMBULATORY_CARE_PROVIDER_SITE_OTHER): Payer: Self-pay | Admitting: Nurse Practitioner

## 2019-04-27 ENCOUNTER — Other Ambulatory Visit: Payer: Self-pay

## 2019-04-27 VITALS — BP 129/78 | HR 97 | Resp 16 | Wt 198.8 lb

## 2019-04-27 DIAGNOSIS — E1142 Type 2 diabetes mellitus with diabetic polyneuropathy: Secondary | ICD-10-CM

## 2019-04-27 DIAGNOSIS — I890 Lymphedema, not elsewhere classified: Secondary | ICD-10-CM

## 2019-04-27 DIAGNOSIS — R600 Localized edema: Secondary | ICD-10-CM | POA: Diagnosis not present

## 2019-04-27 NOTE — Progress Notes (Signed)
Subjective:    Patient ID: Courtney Henderson, female    DOB: [birth year], 59 y.o.   MRN: [id number] Chief Complaint  Patient presents with  . Follow-up    ultrasound follow up    The patient returns to the office for noninvasive studies with regard to evaluating her worsening lower extremity edema.  The patient was previously diagnosed with lymphedema and was being seen at the lymphedema clinic however at that time she began to have issues with her back which ultimately resulted in surgery.  After her back surgery her mobility decreased significantly.  The patient does endorse using Velcro compression wraps however she has not utilized them much until recently.  The patient does endorse that with using the compression wraps the swelling and pain improves.  The patient also takes diuretics that does not help her swelling significantly.  Previous ABIs were normal about a year ago.  The patient does endorse that her legs are very painful with sharp stabbing pains at times.  The patient has had open ulcerations on her legs as well as weeping however there is no weeping present today and no open or draining wounds today.  Patient does endorse that now she is trying to get a little bit more active however she is still not at her baseline.  the patient denies any fever, chills, nausea, vomiting or diarrhea.  Today noninvasive studies show no evidence of DVT bilaterally.  There is also no evidence of superficial venous thrombosis bilaterally.  There is no evidence of deep or superficial venous insufficiency bilaterally.      Review of Systems  Cardiovascular: Positive for leg swelling.  Musculoskeletal: Positive for arthralgias, back pain and gait problem.  All other systems reviewed and are negative.      Objective:   Physical Exam Vitals reviewed.  Constitutional:      Appearance: Normal appearance.  HENT:     Head: Normocephalic.  Pulmonary:     Effort: Pulmonary effort is normal.    Musculoskeletal:        General: Signs of injury present.     Right lower leg: 3+ Pitting Edema present.     Left lower leg: 3+ Pitting Edema present.  Neurological:     General: No focal deficit present.     Mental Status: She is alert.  Psychiatric:        Mood and Affect: Mood normal.        Behavior: Behavior normal.        Thought Content: Thought content normal.        Judgment: Judgment normal.     BP 129/78 (BP Location: Right Arm)   Pulse 97   Resp 16   Wt 198 lb 12.8 oz (90.2 kg)   LMP  (LMP Unknown)   BMI 36.36 kg/m   Past Medical History:  Diagnosis Date  . Complication of anesthesia    long time to wake up, headache  . Diabetes mellitus without complication ([place])   . Lymphedema    in legs/feet  . Neuromuscular disorder [facility])     Social History   Socioeconomic History  . Marital status: Married    Spouse name: Not on file  . Number of children: Not on file  . Years of education: Not on file  . Highest education level: Not on file  Occupational History  . Not on file  Tobacco Use  . Smoking status: Current Every Day Smoker    Packs/day: 1.50  Years: 40.00    Pack years: 60.00    Types: Cigarettes  . Smokeless tobacco: Never Used  Substance and Sexual Activity  . Alcohol use: Not Currently    Alcohol/week: 0.0 standard drinks  . Drug use: Never  . Sexual activity: Not on file  Other Topics Concern  . Not on file  Social History Narrative  . Not on file   Social Determinants of Health   Financial Resource Strain:   . Difficulty of Paying Living Expenses:   Food Insecurity:   . Worried About Charity fundraiser in the Last Year:   . Arboriculturist in the Last Year:   Transportation Needs:   . Film/video editor (Medical):   Marland Kitchen Lack of Transportation (Non-Medical):   Physical Activity:   . Days of Exercise per Week:   . Minutes of Exercise per Session:   Stress:   . Feeling of Stress :   Social Connections:   . Frequency of  Communication with Friends and Family:   . Frequency of Social Gatherings with Friends and Family:   . Attends Religious Services:   . Active Member of Clubs or Organizations:   . Attends Archivist Meetings:   Marland Kitchen Marital Status:   Intimate Partner Violence:   . Fear of Current or Ex-Partner:   . Emotionally Abused:   Marland Kitchen Physically Abused:   . Sexually Abused:     Past Surgical History:  Procedure Laterality Date  . CATARACT EXTRACTION, BILATERAL    . JOINT REPLACEMENT Left    hip  . KYPHOPLASTY N/A 01/05/2019   Procedure: KYPHOPLASTY L5;  Surgeon: Hessie Knows, MD;  Location: [facility] ORS;  Service: Orthopedics;  Laterality: N/A;  . MULTIPLE TOOTH EXTRACTIONS  06/2018   2-3 weeks ago; 10 teeth  . TUBAL LIGATION      Family History  Problem Relation Age of Onset  . Heart disease Maternal Grandfather     Allergies  Allergen Reactions  . Amoxicillin Anaphylaxis  . Doxycycline Shortness Of Breath, Swelling and Other (See Comments)    Chest pain  . Morphine And Related Swelling  . Penicillins Swelling    Did it involve swelling of the face/tongue/throat, SOB, or low BP? Unknown Did it involve sudden or severe rash/hives, skin peeling, or any reaction on the inside of your mouth or nose? Unknown Did you need to seek medical attention at a hospital or doctor's office? Unknown When did it last happen?unkn  If all above answers are "NO", may proceed with cephalosporin use.        Assessment & Plan:   1. Lymphedema I had a long discussion with the patient regarding lymphedema as well as its typical progression.  Unfortunately treatment of lymphedema is mainly contingent upon manual interventions such as utilizing compression, elevation as well as exercise.  I discussed with the patient about utilizing Unna wraps today however the patient does not want to go into Unna wraps due to the fact that she would not be able to shower for several days at a time.  We also  discussed a lymphedema pump however prior to her surgery the patient was working with a company to obtain a lymphedema pump however she decided not to pursue it due to her back issues.  The patient is also concerned because the model that was discussed with her included the abdominal attachment.  Discussed with the patient that there are models that have just the leg sleeves.  At  this time the patient still wishes to not move forward with a lymphedema pump.  I advised the patient to elevate as much as she is able to tolerate.  Some elevation is difficult for the patient due to her recent back surgery.  Frequent exercise is also difficult due to her limited mobility.  I advised patient to be diligent with her use of compression wraps.  We will have the patient follow-up on an as-needed basis.  The patient is advised to contact her office if she begins to weep or have ulcerations or if she decides that she wants to be placed in [place] wraps or proceed with a lymphedema pump.  2. Diabetic polyneuropathy associated with type 2 diabetes mellitus ([place]) The pain that the patient is feeling is likely complicated by her neuropathy in addition to her numerous chronic pain issues.  The patient is currently on Neurontin as well as with pain management.  No changes in medication made patient will continue to follow with primary service   Current Outpatient Medications on File Prior to Visit  Medication Sig Dispense Refill  . Blood Glucose Monitoring Suppl (CONTOUR NEXT EZ MONITOR) w/Device KIT 1 Device by Does not apply route in the morning and at bedtime.   0  . BREO ELLIPTA 100-25 MCG/INH AEPB Inhale 1 puff into the lungs daily.   7  . cephALEXin (KEFLEX) 500 MG capsule Take 500 mg by mouth 4 (four) times daily.    . diphenhydrAMINE HCl (BENADRYL PO) Take 1 tablet by mouth at bedtime.    . furosemide (LASIX) 20 MG tablet Take 20 mg by mouth daily as needed for edema.   5  . gabapentin (NEURONTIN) 300 MG capsule Take  2 capsules (600 mg total) by mouth 2 (two) times daily. 120 capsule 5  . glucose blood (BAYER CONTOUR NEXT TEST) test strip 1 each by Other route in the morning and at bedtime.   6  . ibuprofen (ADVIL) 200 MG tablet Take 200 mg by mouth every 6 (six) hours as needed.    Marland Kitchen KLOR-CON 10 10 MEQ tablet Take 10 mEq by mouth daily as needed (takes with furosemide).   4  . latanoprost (XALATAN) 0.005 % ophthalmic solution Place 1 drop into both eyes at bedtime.    . metFORMIN (GLUCOPHAGE) 500 MG tablet Take 500 mg by mouth 2 (two) times daily.  12  . MICROLET LANCETS MISC 1 Stick by Other route in the morning and at bedtime.   6  . cyclobenzaprine (FLEXERIL) 10 MG tablet Take 1 tablet (10 mg total) by mouth 3 (three) times daily as needed for muscle spasms. (Patient not taking: Reported on 04/07/2019) 50 tablet 1  . docusate sodium (COLACE) 100 MG capsule Take 1 capsule (100 mg total) by mouth 2 (two) times daily. (Patient not taking: Reported on 04/07/2019) 60 capsule 0  . doxycycline (VIBRAMYCIN) 100 MG capsule Take 100 mg by mouth daily.    Marland Kitchen oxyCODONE (OXY IR/ROXICODONE) 5 MG immediate release tablet Take 1 tablet (5 mg total) by mouth every 4 (four) hours as needed for moderate pain ((score 4 to 6)). (Patient not taking: Reported on 04/07/2019) 30 tablet 0  . predniSONE (DELTASONE) 5 MG tablet Take 1 tablet (5 mg total) by mouth daily with breakfast. (Patient not taking: Reported on 04/27/2019) 30 tablet 0   No current facility-administered medications on file prior to visit.    There are no Patient Instructions on file for this visit. No follow-ups on  file.   Kris Hartmann, NP

## 2019-05-15 ENCOUNTER — Other Ambulatory Visit: Payer: Self-pay

## 2019-05-15 ENCOUNTER — Encounter: Payer: Self-pay | Admitting: Podiatry

## 2019-05-15 ENCOUNTER — Ambulatory Visit: Payer: PRIVATE HEALTH INSURANCE | Admitting: Podiatry

## 2019-05-15 VITALS — Temp 98.0°F

## 2019-05-15 DIAGNOSIS — M722 Plantar fascial fibromatosis: Secondary | ICD-10-CM | POA: Diagnosis not present

## 2019-05-15 NOTE — Progress Notes (Signed)
She presents today as she recovers from her back surgery with edematous legs with mild erythema lymphedema pump is coming on Friday according to her from [facility].  She states that her feet are bothering her again she would like to have another set of injections more in the medial longitudinal arch area as she points to the muscle belly along the medial aspect of the right foot.  States that both feet are similar.  Objective: Vital signs are stable she alert and oriented x3 lymphedema bilateral lower extremity not extending into the foot.  She has pain on palpation medial longitudinal arch just beneath the navicular and just proximal to it.  She has no pain on palpation of the posterior tibial tendon bilaterally.  Assessment: Plantar fasciitis along the medial band of the plantar fascia.  Bilateral.  Plan: Injected 10 mg of Kenalog 5 mg Marcaine point maximal tenderness bilaterally.  She tolerated procedure well without complications.  We will follow-up with her in about 6 weeks.

## 2019-06-28 ENCOUNTER — Ambulatory Visit: Payer: PRIVATE HEALTH INSURANCE | Admitting: Podiatry

## 2019-07-10 ENCOUNTER — Encounter: Payer: Self-pay | Admitting: Podiatry

## 2019-07-10 ENCOUNTER — Ambulatory Visit (INDEPENDENT_AMBULATORY_CARE_PROVIDER_SITE_OTHER): Payer: PRIVATE HEALTH INSURANCE | Admitting: Podiatry

## 2019-07-10 ENCOUNTER — Other Ambulatory Visit: Payer: Self-pay

## 2019-07-10 DIAGNOSIS — M722 Plantar fascial fibromatosis: Secondary | ICD-10-CM | POA: Diagnosis not present

## 2019-07-10 NOTE — Progress Notes (Signed)
She presents today for follow-up of her plantar fasciitis bilaterally.  She states that it seems to be doing better having had a whole lot of trouble out of it in the last little while.  Objective: Vital signs are stable she is still alert and oriented x3 still has considerable edema with mild erythema to the bilateral legs.  Assessment: Plantar fasciitis chronic in nature.  Plan: Injected 10 mg of Kenalog 5 mg Marcaine point maximal tenderness bilaterally.  Tolerated seizure well without complications.  Follow-up with her in 6 weeks

## 2019-08-21 ENCOUNTER — Other Ambulatory Visit: Payer: Self-pay

## 2019-08-21 ENCOUNTER — Encounter: Payer: Self-pay | Admitting: Podiatry

## 2019-08-21 ENCOUNTER — Ambulatory Visit (INDEPENDENT_AMBULATORY_CARE_PROVIDER_SITE_OTHER): Payer: PRIVATE HEALTH INSURANCE | Admitting: Podiatry

## 2019-08-21 DIAGNOSIS — M722 Plantar fascial fibromatosis: Secondary | ICD-10-CM

## 2019-08-21 NOTE — Progress Notes (Signed)
She presents today for follow-up of her bilateral heels states that it hurts more in the arch.  Objective: She has pain palpation medial longitudinal arch pulses remain palpable much decrease in edema to her bilateral lower extremity.  Assessment: Resolving edema plan fasciitis bilateral.  Plan: Injected 10 mg of each foot today Kenalog and local anesthetic.  Tolerated procedure well without complications follow-up with her 6 weeks or as needed

## 2019-10-02 ENCOUNTER — Other Ambulatory Visit: Payer: Self-pay

## 2019-10-02 ENCOUNTER — Ambulatory Visit (INDEPENDENT_AMBULATORY_CARE_PROVIDER_SITE_OTHER): Payer: PRIVATE HEALTH INSURANCE | Admitting: Podiatry

## 2019-10-02 ENCOUNTER — Encounter: Payer: Self-pay | Admitting: Podiatry

## 2019-10-02 DIAGNOSIS — M722 Plantar fascial fibromatosis: Secondary | ICD-10-CM | POA: Diagnosis not present

## 2019-10-02 NOTE — Progress Notes (Signed)
She presents today chief complaint of painful heels bilaterally.  She states that seems to be more in the arch though than in the heels that she points to the medial aspect of the calcaneus distally.  Objective: Vital signs are stable she alert oriented x3 her pain is actually in the proximal portion of the plantar fascial but distal to the plantar fascial insertion.  Assessment: Plantar fasciitis chronic in nature.  Plan: Injected bilateral heels today with 10 mg Kenalog 5 mg Marcaine follow-up with her as needed.

## 2019-10-23 ENCOUNTER — Other Ambulatory Visit: Payer: Self-pay | Admitting: *Deleted

## 2019-10-23 MED ORDER — ACCU-CHEK GUIDE VI STRP: ORAL_STRIP | 12 refills | Status: DC

## 2019-11-13 ENCOUNTER — Other Ambulatory Visit: Payer: Self-pay

## 2019-11-13 ENCOUNTER — Ambulatory Visit (INDEPENDENT_AMBULATORY_CARE_PROVIDER_SITE_OTHER): Payer: PRIVATE HEALTH INSURANCE | Admitting: Podiatry

## 2019-11-13 ENCOUNTER — Encounter: Payer: Self-pay | Admitting: Podiatry

## 2019-11-13 DIAGNOSIS — M722 Plantar fascial fibromatosis: Secondary | ICD-10-CM

## 2019-11-13 DIAGNOSIS — M7751 Other enthesopathy of right foot: Secondary | ICD-10-CM

## 2019-11-13 NOTE — Progress Notes (Signed)
She presents today for follow-up of her bilateral plantar fasciitis as well as her right ankle joint.  Objective: Vital signs are stable she is alert and oriented x3.  Pulses are palpable.  She has pain on palpation just distal to the insertion site of the plantar fascia at the medial longitudinal arch bilaterally.  She also has pain in the lateral gutter and the top margin of the ankle joint.  Assessment: Pain in limb secondary to ankle joint pain and plantar fasciitis bilaterally.  Plan: Injected both of these areas today with 10 mg of Kenalog 5 mg of Marcaine.  Also injected the ankle joint right.  Follow-up with her in about 2 months

## 2019-11-22 ENCOUNTER — Other Ambulatory Visit: Payer: Self-pay | Admitting: *Deleted

## 2019-11-22 MED ORDER — AZITHROMYCIN 250 MG PO TABS: ORAL_TABLET | ORAL | 0 refills | Status: DC

## 2019-12-12 ENCOUNTER — Ambulatory Visit (INDEPENDENT_AMBULATORY_CARE_PROVIDER_SITE_OTHER): Payer: PRIVATE HEALTH INSURANCE | Admitting: Internal Medicine

## 2019-12-12 ENCOUNTER — Other Ambulatory Visit: Payer: Self-pay

## 2019-12-12 DIAGNOSIS — I10 Essential (primary) hypertension: Secondary | ICD-10-CM

## 2019-12-12 DIAGNOSIS — Z1329 Encounter for screening for other suspected endocrine disorder: Secondary | ICD-10-CM

## 2019-12-12 DIAGNOSIS — E119 Type 2 diabetes mellitus without complications: Secondary | ICD-10-CM | POA: Diagnosis not present

## 2019-12-12 DIAGNOSIS — E785 Hyperlipidemia, unspecified: Secondary | ICD-10-CM

## 2019-12-13 LAB — CBC WITH DIFFERENTIAL/PLATELET
Absolute Monocytes: 505 cells/uL (ref 200–950)
Basophils Absolute: 91 cells/uL (ref 0–200)
Basophils Relative: 0.9 %
Eosinophils Absolute: 283 cells/uL (ref 15–500)
Eosinophils Relative: 2.8 %
HCT: 43 % (ref 35.0–45.0)
Hemoglobin: 14.4 g/dL (ref 11.7–15.5)
Lymphs Abs: 2242 cells/uL (ref 850–3900)
MCH: 32.7 pg (ref 27.0–33.0)
MCHC: 33.5 g/dL (ref 32.0–36.0)
MCV: 97.7 fL (ref 80.0–100.0)
MPV: 11.5 fL (ref 7.5–12.5)
Monocytes Relative: 5 %
Neutro Abs: 6979 cells/uL (ref 1500–7800)
Neutrophils Relative %: 69.1 %
Platelets: 291 10*3/uL (ref 140–400)
RBC: 4.4 10*6/uL (ref 3.80–5.10)
RDW: 13.4 % (ref 11.0–15.0)
Total Lymphocyte: 22.2 %
WBC: 10.1 10*3/uL (ref 3.8–10.8)

## 2019-12-13 LAB — COMPLETE METABOLIC PANEL WITH GFR
AG Ratio: 1.4 (calc) (ref 1.0–2.5)
ALT: 15 U/L (ref 6–29)
AST: 19 U/L (ref 10–35)
Albumin: 4 g/dL (ref 3.6–5.1)
Alkaline phosphatase (APISO): 71 U/L (ref 37–153)
BUN: 10 mg/dL (ref 7–25)
CO2: 24 mmol/L (ref 20–32)
Calcium: 9.5 mg/dL (ref 8.6–10.4)
Chloride: 104 mmol/L (ref 98–110)
Creat: 0.59 mg/dL (ref 0.50–0.99)
GFR, Est African American: 115 mL/min/{1.73_m2} (ref >=60)
GFR, Est Non African American: 100 mL/min/{1.73_m2} (ref >=60)
Globulin: 2.8 g/dL (calc) (ref 1.9–3.7)
Glucose, Bld: 83 mg/dL (ref 65–99)
Potassium: 4.7 mmol/L (ref 3.5–5.3)
Sodium: 142 mmol/L (ref 135–146)
Total Bilirubin: 0.5 mg/dL (ref 0.2–1.2)
Total Protein: 6.8 g/dL (ref 6.1–8.1)

## 2019-12-13 LAB — HEMOGLOBIN A1C
Hgb A1c MFr Bld: 4.9 % of total Hgb (ref <5.7)
Mean Plasma Glucose: 94 (calc)
eAG (mmol/L): 5.2 (calc)

## 2019-12-13 LAB — TSH: TSH: 1.67 mIU/L (ref 0.40–4.50)

## 2019-12-13 LAB — LIPID PANEL
Cholesterol: 205 mg/dL — ABNORMAL HIGH (ref <200)
HDL: 63 mg/dL (ref >=50)
LDL Cholesterol (Calc): 120 mg/dL (calc) — ABNORMAL HIGH
Non-HDL Cholesterol (Calc): 142 mg/dL (calc) — ABNORMAL HIGH (ref <130)
Total CHOL/HDL Ratio: 3.3 (calc) (ref <5.0)
Triglycerides: 114 mg/dL (ref <150)

## 2019-12-18 ENCOUNTER — Ambulatory Visit (INDEPENDENT_AMBULATORY_CARE_PROVIDER_SITE_OTHER): Payer: PRIVATE HEALTH INSURANCE | Admitting: Family Medicine

## 2019-12-18 ENCOUNTER — Other Ambulatory Visit: Payer: Self-pay

## 2019-12-18 ENCOUNTER — Encounter: Payer: Self-pay | Admitting: Family Medicine

## 2019-12-18 VITALS — BP 140/80 | HR 85 | Ht 61.0 in | Wt 199.0 lb

## 2019-12-18 DIAGNOSIS — S61412A Laceration without foreign body of left hand, initial encounter: Secondary | ICD-10-CM

## 2019-12-18 DIAGNOSIS — Z0000 Encounter for general adult medical examination without abnormal findings: Secondary | ICD-10-CM

## 2019-12-18 DIAGNOSIS — F17290 Nicotine dependence, other tobacco product, uncomplicated: Secondary | ICD-10-CM | POA: Diagnosis not present

## 2019-12-18 DIAGNOSIS — Z23 Encounter for immunization: Secondary | ICD-10-CM

## 2019-12-18 DIAGNOSIS — E785 Hyperlipidemia, unspecified: Secondary | ICD-10-CM | POA: Diagnosis not present

## 2019-12-18 DIAGNOSIS — Z1231 Encounter for screening mammogram for malignant neoplasm of breast: Secondary | ICD-10-CM

## 2019-12-18 DIAGNOSIS — E119 Type 2 diabetes mellitus without complications: Secondary | ICD-10-CM | POA: Diagnosis not present

## 2019-12-18 LAB — GLUCOSE, POCT (MANUAL RESULT ENTRY): POC Glucose: 116 mg/dl — AB (ref 70–99)

## 2019-12-18 MED ORDER — GABAPENTIN 600 MG PO TABS: 600.0000 mg | ORAL_TABLET | Freq: Three times a day (TID) | ORAL | 2 refills | Status: DC

## 2019-12-18 NOTE — Assessment & Plan Note (Signed)
Skin tear left hand, healing well no sign of infection. Giving TDAp today.

## 2019-12-18 NOTE — Progress Notes (Signed)
 Established Patient Office Visit  SUBJECTIVE:  Subjective  Patient ID: Courtney Henderson, female    DOB: 03/03/1960  Age: 59 y.o. MRN: [id number]  CC:  Chief Complaint  Patient presents with  . Annual Exam    HPI Courtney Henderson is a 59 y.o. female presenting today for patient had L4-L-5 fusion in March of 2021, using walker and progressing well, still using walker to support her, R side numbness has remained. Taking all meds.    Past Medical History:  Diagnosis Date  . Complication of anesthesia    long time to wake up, headache  . Diabetes mellitus without complication (H[facility])   . Lymphedema    in legs/feet  . Neuromuscular disorder ([facility])     Past Surgical History:  Procedure Laterality Date  . CATARACT EXTRACTION, BILATERAL    . JOINT REPLACEMENT Left    hip  . KYPHOPLASTY N/A 01/05/2019   Procedure: KYPHOPLASTY L5;  Surgeon: Menz, Michael, MD;  Location: [facility] ORS;  Service: Orthopedics;  Laterality: N/A;  . MULTIPLE TOOTH EXTRACTIONS  06/2018   2-3 weeks ago; 10 teeth  . TUBAL LIGATION      Family History  Problem Relation Age of Onset  . Heart disease Maternal Grandfather     Social History   Socioeconomic History  . Marital status: Married    Spouse name: Not on file  . Number of children: Not on file  . Years of education: Not on file  . Highest education level: Not on file  Occupational History  . Not on file  Tobacco Use  . Smoking status: Current Every Day Smoker    Packs/day: 1.50    Years: 40.00    Pack years: 60.00    Types: Cigarettes  . Smokeless tobacco: Never Used  Vaping Use  . Vaping Use: Never used  Substance and Sexual Activity  . Alcohol use: Not Currently    Alcohol/week: 0.0 standard drinks  . Drug use: Never  . Sexual activity: Not on file  Other Topics Concern  . Not on file  Social History Narrative  . Not on file   Social Determinants of Health   Financial Resource Strain:   . Difficulty of Paying Living Expenses: Not  on file  Food Insecurity:   . Worried About Running Out of Food in the Last Year: Not on file  . Ran Out of Food in the Last Year: Not on file  Transportation Needs:   . Lack of Transportation (Medical): Not on file  . Lack of Transportation (Non-Medical): Not on file  Physical Activity:   . Days of Exercise per Week: Not on file  . Minutes of Exercise per Session: Not on file  Stress:   . Feeling of Stress : Not on file  Social Connections:   . Frequency of Communication with Friends and Family: Not on file  . Frequency of Social Gatherings with Friends and Family: Not on file  . Attends Religious Services: Not on file  . Active Member of Clubs or Organizations: Not on file  . Attends Club or Organization Meetings: Not on file  . Marital Status: Not on file  Intimate Partner Violence:   . Fear of Current or Ex-Partner: Not on file  . Emotionally Abused: Not on file  . Physically Abused: Not on file  . Sexually Abused: Not on file     Current Outpatient Medications:  .  Blood Glucose Monitoring Suppl (CONTOUR NEXT EZ MONITOR) w/Device KIT, 1   Device by Does not apply route in the morning and at bedtime. , Disp: , Rfl: 0 .  BREO ELLIPTA 100-25 MCG/INH AEPB, Inhale 1 puff into the lungs daily. , Disp: , Rfl: 7 .  gabapentin (NEURONTIN) 600 MG tablet, Take 600 mg by mouth 3 (three) times daily., Disp: , Rfl:  .  glucose blood (ACCU-CHEK GUIDE) test strip, Use as instructed, Disp: 100 each, Rfl: 12 .  ibuprofen (ADVIL) 200 MG tablet, Take 200 mg by mouth every 6 (six) hours as needed., Disp: , Rfl:  .  latanoprost (XALATAN) 0.005 % ophthalmic solution, Place 1 drop into both eyes at bedtime., Disp: , Rfl:  .  metFORMIN (GLUCOPHAGE) 500 MG tablet, Take 500 mg by mouth 2 (two) times daily., Disp: , Rfl: 12 .  MICROLET LANCETS MISC, 1 Stick by Other route in the morning and at bedtime. , Disp: , Rfl: 6 .  glipiZIDE (GLUCOTROL) 5 MG tablet, Take 5 mg by mouth daily. (Patient not taking:  Reported on 12/18/2019), Disp: , Rfl:    Allergies  Allergen Reactions  . Amoxicillin Anaphylaxis  . Doxycycline Shortness Of Breath, Swelling and Other (See Comments)    Chest pain  . Morphine And Related Swelling  . Penicillins Swelling    Did it involve swelling of the face/tongue/throat, SOB, or low BP? Unknown Did it involve sudden or severe rash/hives, skin peeling, or any reaction on the inside of your mouth or nose? Unknown Did you need to seek medical attention at a hospital or doctor's office? Unknown When did it last happen?unkn  If all above answers are "NO", may proceed with cephalosporin use.     ROS Review of Systems  Constitutional: Negative.   HENT: Negative.   Cardiovascular: Negative.   Musculoskeletal: Negative.   Skin: Negative.   Neurological: Negative.   All other systems reviewed and are negative.    OBJECTIVE:    Physical Exam Vitals and nursing note reviewed.  Constitutional:      Appearance: She is obese.  HENT:     Head: Normocephalic.     Right Ear: Tympanic membrane normal.     Mouth/Throat:     Mouth: Mucous membranes are moist.  Eyes:     Pupils: Pupils are equal, round, and reactive to light.  Cardiovascular:     Rate and Rhythm: Normal rate.  Pulmonary:     Breath sounds: Wheezing present.  Abdominal:     General: Abdomen is flat.  Musculoskeletal:        General: Normal range of motion.     Cervical back: Normal range of motion.  Skin:    Findings: Bruising present.  Neurological:     General: No focal deficit present.     Mental Status: She is alert.  Psychiatric:        Mood and Affect: Mood normal.     BP 140/80   Pulse 85   Ht 5' 1" (1.549 m)   Wt 199 lb (90.3 kg)   LMP  (LMP Unknown)   BMI 37.60 kg/m  Wt Readings from Last 3 Encounters:  12/18/19 199 lb (90.3 kg)  04/27/19 198 lb 12.8 oz (90.2 kg)  04/07/19 192 lb (87.1 kg)    Health Maintenance Due  Topic Date Due  . Hepatitis C Screening  Never  done  . PNEUMOCOCCAL POLYSACCHARIDE VACCINE AGE 39-64 HIGH RISK  Never done  . FOOT EXAM  Never done  . URINE MICROALBUMIN  Never done  . HIV  Screening  Never done  . TETANUS/TDAP  Never done  . PAP SMEAR-Modifier  Never done  . MAMMOGRAM  Never done  . COLONOSCOPY  Never done  . INFLUENZA VACCINE  08/20/2019    There are no preventive care reminders to display for this patient.  CBC Latest Ref Rng & Units 12/12/2019 03/23/2019 03/16/2019  WBC 3.8 - 10.8 Thousand/uL 10.1 16.8(H) 16.4(H)  Hemoglobin 11.7 - 15.5 g/dL 14.4 11.9(L) 15.3(H)  Hematocrit 35 - 45 % 43.0 33.3(L) 44.6  Platelets 140 - 400 Thousand/uL 291 223 237   CMP Latest Ref Rng & Units 12/12/2019 03/23/2019 03/16/2019  Glucose 65 - 99 mg/dL 83 177(H) 311(H)  BUN 7 - 25 mg/dL 10 6 10  Creatinine 0.50 - 0.99 mg/dL 0.59 0.61 0.61  Sodium 135 - 146 mmol/L 142 140 138  Potassium 3.5 - 5.3 mmol/L 4.7 3.9 4.1  Chloride 98 - 110 mmol/L 104 103 100  CO2 20 - 32 mmol/L 24 27 23  Calcium 8.6 - 10.4 mg/dL 9.5 8.3(L) 9.2  Total Protein 6.1 - 8.1 g/dL 6.8 - -  Total Bilirubin 0.2 - 1.2 mg/dL 0.5 - -  AST 10 - 35 U/L 19 - -  ALT 6 - 29 U/L 15 - -    Lab Results  Component Value Date   TSH 1.67 12/12/2019   Lab Results  Component Value Date   ANIONGAP 10 03/23/2019   Lab Results  Component Value Date   CHOL 205 (H) 12/12/2019   HDL 63 12/12/2019   LDLCALC 120 (H) 12/12/2019   CHOLHDL 3.3 12/12/2019   Lab Results  Component Value Date   TRIG 114 12/12/2019   Lab Results  Component Value Date   HGBA1C 4.9 12/12/2019   HGBA1C 6.7 (H) 03/16/2019      ASSESSMENT & PLAN:   Problem List Items Addressed This Visit      Endocrine   Type 2 diabetes mellitus without complication (HCC) - Primary    Diabetes mellitus Type II, under excellent control.. Discussed general issues about diabetes pathophysiology and management. Continue Metformin, has not taken the Glipizide at all in several months due to her glucose bottoming  out.        Relevant Orders   POCT glucose (manual entry) (Completed)     Musculoskeletal and Integument   Skin tear of left hand without complication    Skin tear left hand, healing well no sign of infection. Giving TDAp today.         Other   Annual physical exam    Mammogram- Unsure we will call to schedule.  Pap- Has not had one in years.  TDAP- Unsure, will give today, has had skin tear recently.  Colon Screening- Needs Scheduled      Hyperlipidemia    Not on any medication at this time, with LDL of 120 discussed low dose statin to minimize CVD risk but pt would like to wait and watch diet and re check in 6 months.        Cigar smoker    - The patient currently smokes regularly. - change or avoid triggers like smoky places, drinking alcohol and other smokers - drink 4-6 glasses of water each day - use Quit Line 1-800-Quit Now  Not motivated to quit at this time.           No orders of the defined types were placed in this encounter.     Follow-up: No follow-ups on file.      Beckie Salts, [place] [address], [place], [place] [address]

## 2019-12-18 NOTE — Assessment & Plan Note (Addendum)
Mammogram- Unsure we will call to schedule.  Pap- Has not had one in years.  TDAP- Unsure, will give today, has had skin tear recently.  Colon Screening- Needs Scheduled

## 2019-12-18 NOTE — Assessment & Plan Note (Signed)
Diabetes mellitus Type II, under excellent control.. Discussed general issues about diabetes pathophysiology and management. Continue Metformin, has not taken the Glipizide at all in several months due to her glucose bottoming out.

## 2019-12-18 NOTE — Assessment & Plan Note (Signed)
-   The patient currently smokes regularly. - change or avoid triggers like smoky places, drinking alcohol and other smokers - drink 4-6 glasses of water each day - use Quit Line 1-800-Quit Now  Not motivated to quit at this time.

## 2019-12-18 NOTE — Assessment & Plan Note (Signed)
Not on any medication at this time, with LDL of 120 discussed low dose statin to minimize CVD risk but pt would like to wait and watch diet and re check in 6 months.

## 2019-12-25 ENCOUNTER — Ambulatory Visit (INDEPENDENT_AMBULATORY_CARE_PROVIDER_SITE_OTHER): Payer: PRIVATE HEALTH INSURANCE | Admitting: Podiatry

## 2019-12-25 ENCOUNTER — Encounter: Payer: Self-pay | Admitting: Podiatry

## 2019-12-25 ENCOUNTER — Other Ambulatory Visit: Payer: Self-pay

## 2019-12-25 DIAGNOSIS — M7751 Other enthesopathy of right foot: Secondary | ICD-10-CM | POA: Diagnosis not present

## 2019-12-25 DIAGNOSIS — M722 Plantar fascial fibromatosis: Secondary | ICD-10-CM | POA: Diagnosis not present

## 2019-12-25 MED ORDER — TRIAMCINOLONE ACETONIDE 40 MG/ML IJ SUSP
20.0000 mg | Freq: Once | INTRAMUSCULAR | Status: AC
  Administered 2019-12-25: 20 mg

## 2019-12-25 MED ORDER — TRIAMCINOLONE ACETONIDE 40 MG/ML IJ SUSP
40.0000 mg | Freq: Once | INTRAMUSCULAR | Status: AC
  Administered 2019-12-25: 40 mg

## 2019-12-25 NOTE — Progress Notes (Signed)
She presents today complaining of pain to the sinus tarsi area of the right foot as well as medial longitudinal arch.  Objective: Pulses are palpable.  There is no erythema cellulitis drainage odor she has moderate edema in the legs.  Mild tenderness on palpation sinus tarsi right significant pain on palpation medial longitudinal arch bilaterally.  Assessment: Pain in limb secondary to plantar fasciitis medial longitudinal arch and capsulitis subtalar joint right.  Plan: I injected subtalar joint right today with 10 mg Kenalog 5 mg Marcaine point of maximal tenderness.  Also injected about the same amount to the bilateral medial longitudinal arch [place] as skin prep.  Tolerated procedure well follow-up with her in the near future.

## 2019-12-28 MED ORDER — METFORMIN HCL 500 MG PO TABS: 500.0000 mg | ORAL_TABLET | Freq: Two times a day (BID) | ORAL | 12 refills | Status: AC

## 2019-12-28 NOTE — Addendum Note (Signed)
Addended by: Jobie Quaker on: 12/28/2019 02:35 PM   Modules accepted: Orders

## 2020-01-23 ENCOUNTER — Other Ambulatory Visit: Payer: Self-pay

## 2020-01-23 MED ORDER — BREO ELLIPTA 100-25 MCG/INH IN AEPB: 1.0000 | INHALATION_SPRAY | Freq: Every day | RESPIRATORY_TRACT | 7 refills | Status: AC

## 2020-02-05 ENCOUNTER — Encounter: Payer: PRIVATE HEALTH INSURANCE | Admitting: Podiatry

## 2020-02-07 ENCOUNTER — Encounter: Payer: Self-pay | Admitting: Podiatry

## 2020-02-12 ENCOUNTER — Other Ambulatory Visit: Payer: Self-pay | Admitting: *Deleted

## 2020-02-12 MED ORDER — ACCU-CHEK GUIDE VI STRP: ORAL_STRIP | 12 refills | Status: AC

## 2020-02-21 ENCOUNTER — Encounter: Payer: Self-pay | Admitting: Podiatry

## 2020-02-29 ENCOUNTER — Other Ambulatory Visit: Payer: Self-pay | Admitting: Neurosurgery

## 2020-02-29 DIAGNOSIS — R200 Anesthesia of skin: Secondary | ICD-10-CM

## 2020-02-29 DIAGNOSIS — M4316 Spondylolisthesis, lumbar region: Secondary | ICD-10-CM

## 2020-03-13 ENCOUNTER — Encounter: Payer: Self-pay | Admitting: Podiatry

## 2020-03-13 ENCOUNTER — Other Ambulatory Visit: Payer: Self-pay

## 2020-03-13 ENCOUNTER — Ambulatory Visit (INDEPENDENT_AMBULATORY_CARE_PROVIDER_SITE_OTHER): Payer: 59 | Admitting: Podiatry

## 2020-03-13 DIAGNOSIS — M7751 Other enthesopathy of right foot: Secondary | ICD-10-CM

## 2020-03-13 DIAGNOSIS — M722 Plantar fascial fibromatosis: Secondary | ICD-10-CM | POA: Diagnosis not present

## 2020-03-13 MED ORDER — TRIAMCINOLONE ACETONIDE 40 MG/ML IJ SUSP
60.0000 mg | Freq: Once | INTRAMUSCULAR | Status: AC
  Administered 2020-03-13: 60 mg

## 2020-03-13 NOTE — Progress Notes (Signed)
She presents today states that the plantar aspect fasciitis feels a little bit better but I am still having pain over here she points to the sinus tarsi of the right foot.  Objective: Pulses remain palpable still swelling to the left leg but not of the right.  Minimal swelling in the foot.  She has pain on palpation medial calcaneal tubercles bilaterally pain on palpation and with inversion of the subtalar joint right.  Assessment: Subtalar joint capsulitis sinus tarsitis.  Plan fasciitis bilateral.  Plan: I injected the bilateral fasciitis today with 5 mg of Kenalog 5 mg of Marcaine.  Also injected the sinus tarsi today with 10 mg of Kenalog and I will follow-up with her in 2 months

## 2020-03-15 ENCOUNTER — Ambulatory Visit
Admission: RE | Admit: 2020-03-15 | Discharge: 2020-03-15 | Disposition: A | Discharge status: Home / Self Care | Payer: 59 | Source: Ambulatory Visit | Attending: Neurosurgery | Admitting: Neurosurgery

## 2020-03-15 ENCOUNTER — Other Ambulatory Visit: Payer: Self-pay

## 2020-03-15 DIAGNOSIS — R200 Anesthesia of skin: Secondary | ICD-10-CM | POA: Diagnosis present

## 2020-03-15 DIAGNOSIS — M4316 Spondylolisthesis, lumbar region: Secondary | ICD-10-CM

## 2020-03-15 IMAGING — MR MR LUMBAR SPINE WO/W CM
6 of 7 series · 31 of 48 positions shown · IV contrast (9ml Gadavist)
Comparison: MRI of the lumbar spine [DATE].

CLINICAL DATA: Spondylolisthesis, lumbar region.

EXAM:
MRI LUMBAR SPINE WITHOUT AND WITH CONTRAST
TECHNIQUE: Multiplanar and multiecho pulse sequences of the lumbar spine were
obtained without and with intravenous contrast.
CONTRAST:  9mL GADAVIST GADOBUTROL 1 MMOL/ML IV SOLN

[Series 5: T2 · sagittal · 4.0mm · 0.81mm/px · 5 of 17 slices shown (1 of 2)]
[im 1/17]
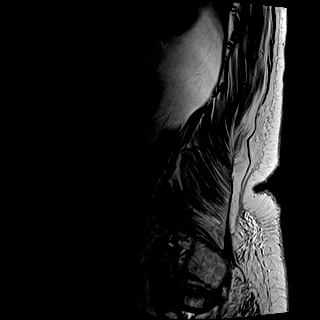
[im 5/17]
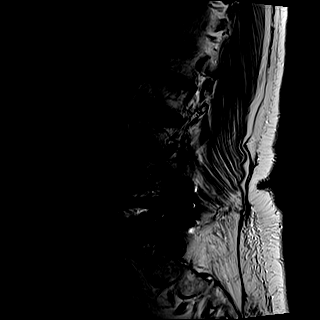
[im 9/17]
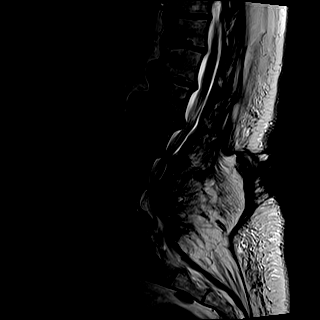
[im 13/17]
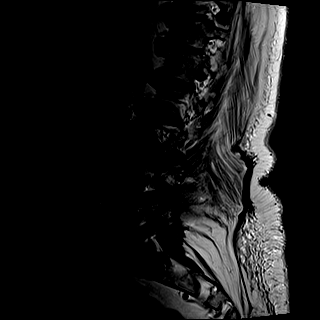
[im 17/17]
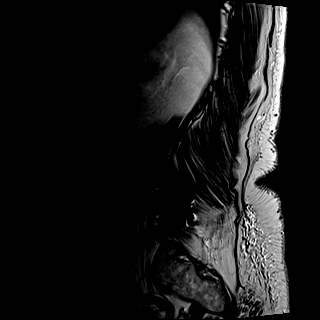

[Series 6: T1 · sagittal · 4.0mm · 0.81mm/px · 5 of 17 slices shown (1 of 2)]
[im 1/17]
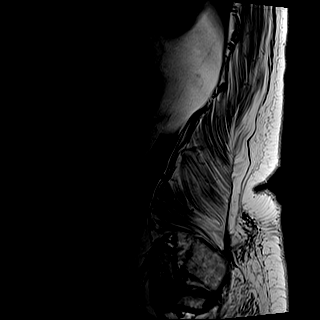
[im 5/17]
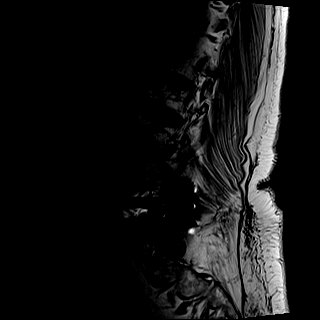
[im 9/17]
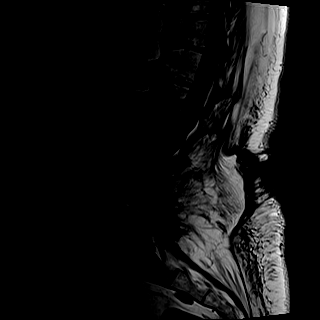
[im 13/17]
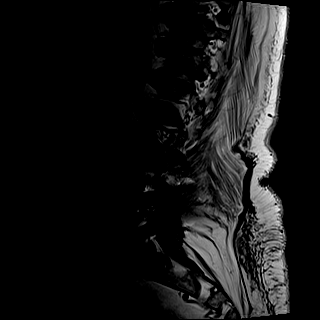
[im 17/17]
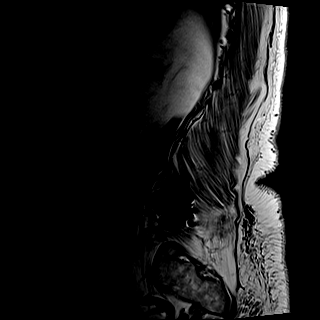

[Series 7: STIR · sagittal · 4.0mm · 0.41mm/px · 1 of 17 slices shown]
[im 1/17]
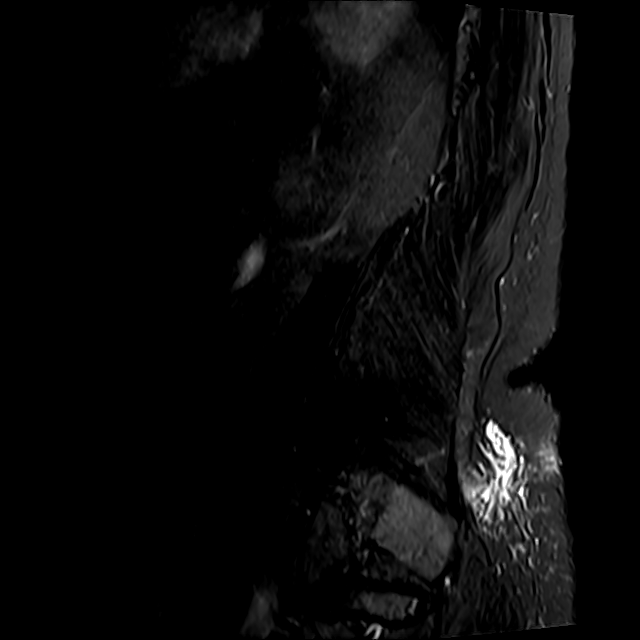

[Series 8: T2 · axial · 4.0mm · 0.78mm/px · z∈[-77,+158]mm · 8 of 41 slices shown (2 of 2)]
[im 1/41]
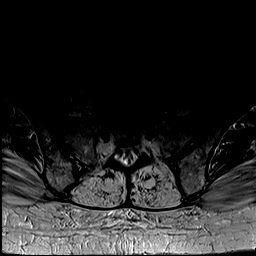
[im 5/41]
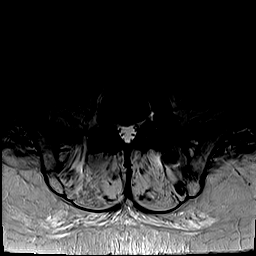
[im 14/41]
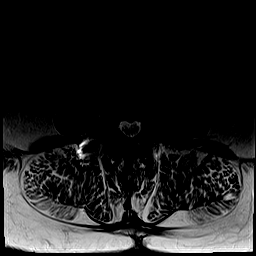
[im 18/41]
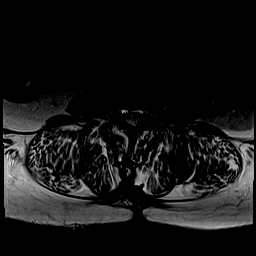
[im 23/41]
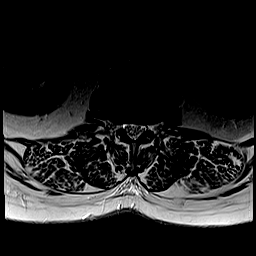
[im 27/41]
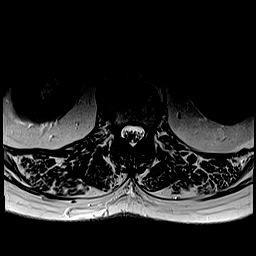
[im 36/41]
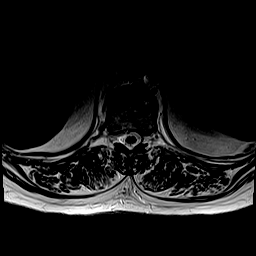
[im 41/41]
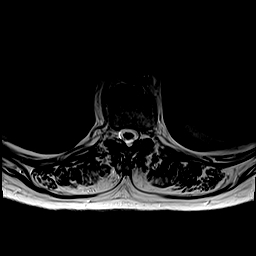

[Series 9: T1 · axial · 4.0mm · 0.39mm/px · z∈[-77,+158]mm · 8 of 41 slices shown (2 of 2)]
[im 1/41]
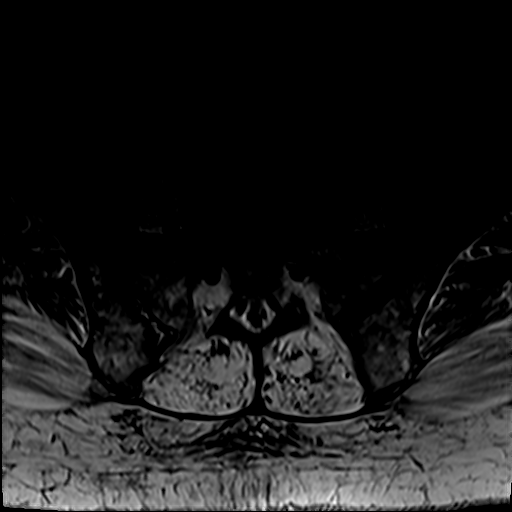
[im 5/41]
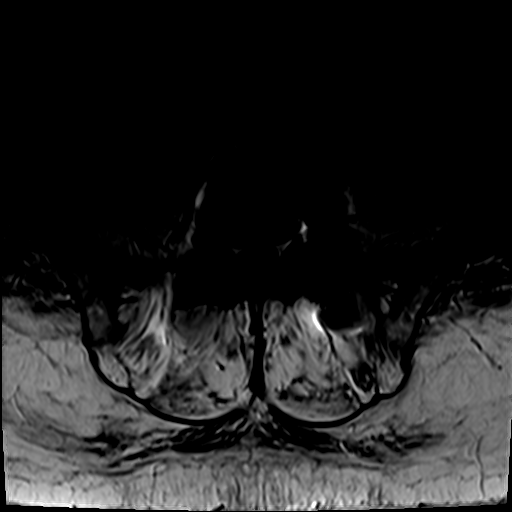
[im 14/41]
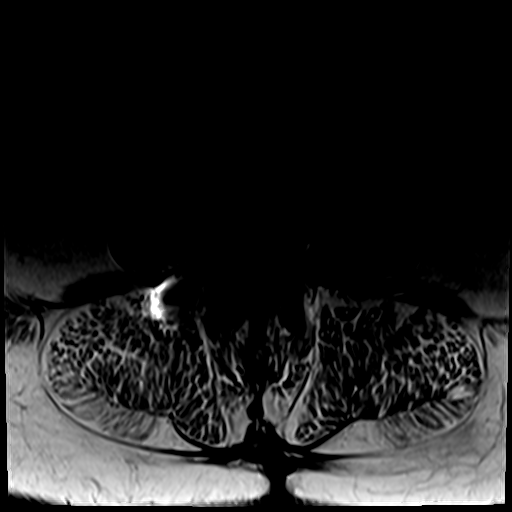
[im 18/41]
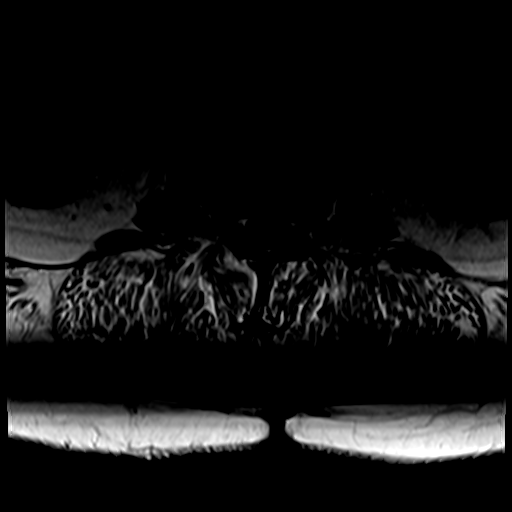
[im 23/41]
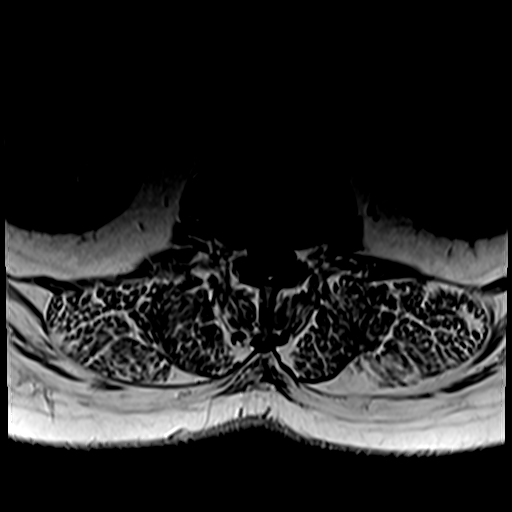
[im 27/41]
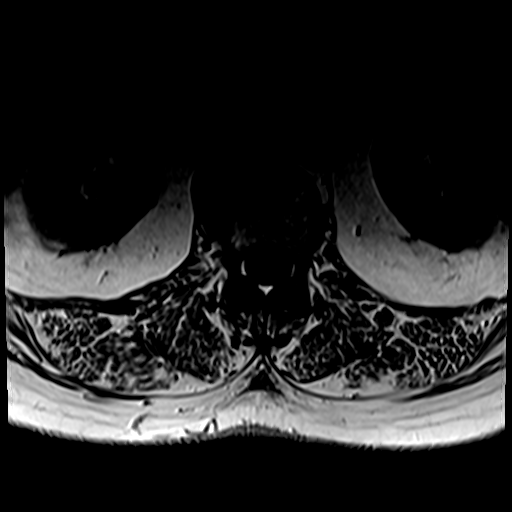
[im 36/41]
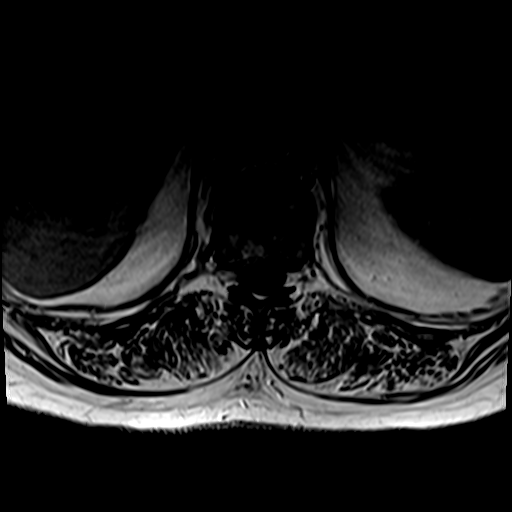
[im 41/41]
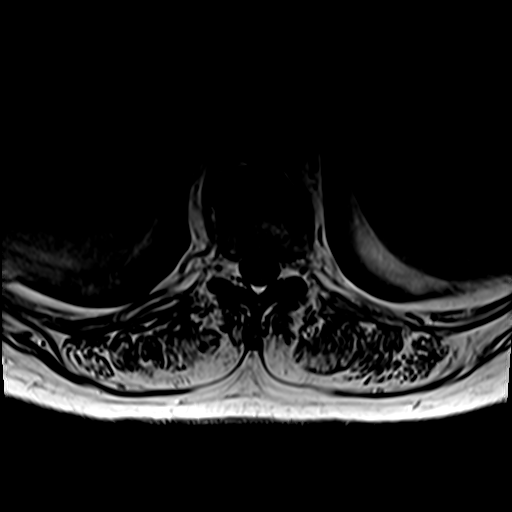

[Series 11: T1 fat-sat · sagittal · 4.0mm · 0.81mm/px · 4 of 17 slices shown]
[im 1/17]
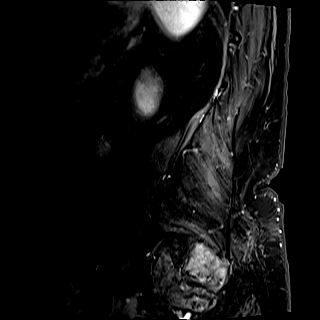
[im 6/17]
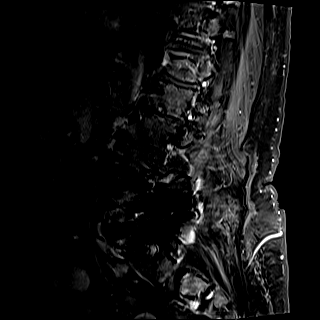
[im 11/17]
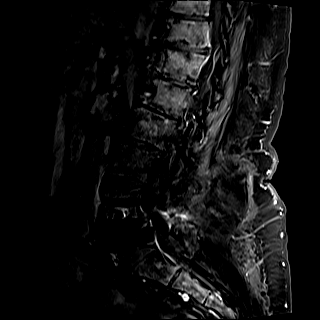
[im 17/17]
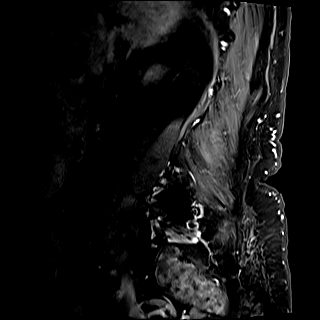

[31 of 48 positions shown; findings below may reference images not displayed]

FINDINGS: Segmentation:  Standard.

Alignment: Grade 1 anterolisthesis of L4 over L5. Small
retrolisthesis from T12-L1 through L3-4.

Vertebrae: No fracture, evidence of discitis, or bone lesion. There
is interval laminectomy and posterior fixation at L4-5.

Conus medullaris and cauda equina: Conus extends to the L1 level.
Conus and cauda equina appear normal.

Paraspinal and other soft tissues: Again seen atrophy of the lower
paraspinal musculature.

Disc levels:

T10-11: Small posterior disc protrusion. No significant spinal canal
or neural foraminal stenosis.

T11-12: Right posterolateral disc protrusion and facet degenerative
changes with ligamentum flavum redundancy causing indentation on the
thecal sac without significant spinal canal or neural foraminal
stenosis, unchanged from prior MRI.

T12-L1: Loss of disc height, disc bulge and mild facet degenerative
changes without significant spinal canal or neural foraminal
stenosis.

L1-2: Loss of disc height, disc bulge and moderate facet
degenerative changes resulting in mild spinal canal stenosis and
mild bilateral neural foraminal narrowing. No significant change
from prior.

L2-3: Loss of disc height, disc bulge and moderate facet
degenerative changes resulting in mild spinal canal stenosis with
narrowing of the bilateral subarticular zones, left greater than
right and moderate bilateral neural foraminal narrowing. No
significant change from prior.

L3-4: Disc bulge, moderate facet degenerative changes and ligamentum
flavum redundancy resulting in mild spinal canal stenosis
mild-to-moderate left neural foraminal narrowing, no significant
change from prior MRI.

L4-5: Postsurgical changes from 1 neck communicate posterior
fixation with interval resolution of the severe spinal canal
stenosis. T1 hypointense, enhancing tissue surrounding the thecal
sac and extending into the subarticular zones and bilateral neural
foramen, may represent granulation tissue versus postsurgical
fibrosis. Persistent high-grade stenosis of the bilateral neural
foramen.

L5-S1: Mild facet degenerative changes. No spinal canal or neural
foraminal stenosis.
IMPRESSION: 1. Interval laminectomy and posterior fixation at L4-5.
2. T1 hypointense, enhancing tissue surrounding the thecal sac and
extending into the subarticular zones and bilateral neural foramen,
may represent granulation tissue versus postsurgical fibrosis.
Persistent high-grade stenosis of the bilateral neural foramen at
this level.
3. Mild spinal canal stenosis at L1-2 through L3-4 are unchanged
from prior MRI.

## 2020-03-15 IMAGING — MR MR CERVICAL SPINE W/O CM
5 series · 38 of 48 positions shown · non-contrast
Comparison: None.

CLINICAL DATA: Bilateral hand numbness.

EXAM:
MRI CERVICAL SPINE WITHOUT CONTRAST
TECHNIQUE: Multiplanar, multisequence MR imaging of the cervical spine was
performed. No intravenous contrast was administered.

[Series 5: T2 · sagittal · 3.0mm · 0.62mm/px · 6 of 15 slices shown (1 of 2)]
[im 1/15]
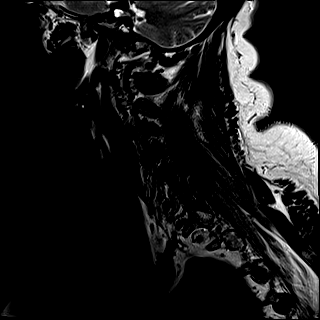
[im 3/15]
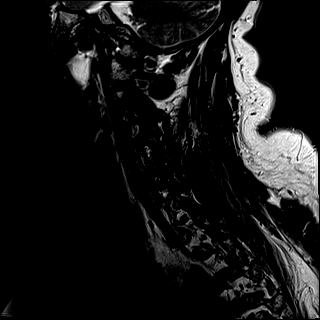
[im 6/15]
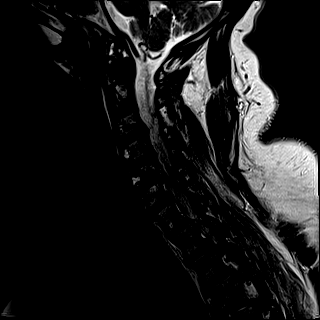
[im 9/15]
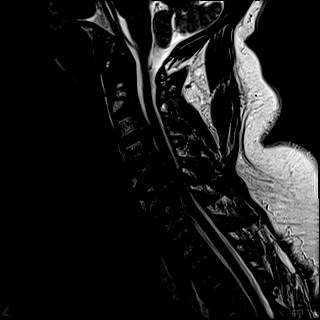
[im 12/15]
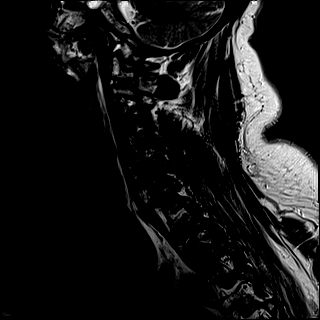
[im 15/15]
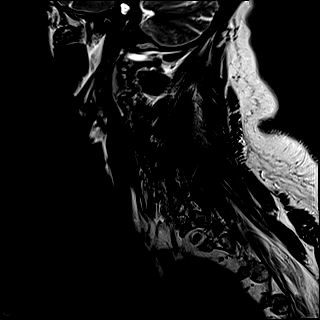

[Series 6: FLAIR · sagittal · 3.0mm · 0.78mm/px · 7 of 15 slices shown]
[im 1/15]
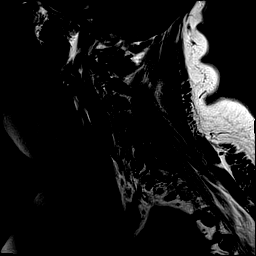
[im 3/15]
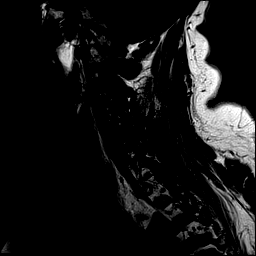
[im 5/15]
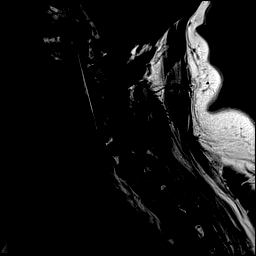
[im 8/15]
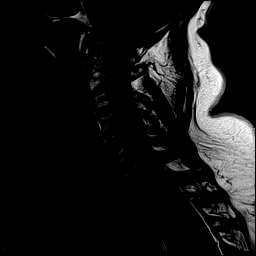
[im 10/15]
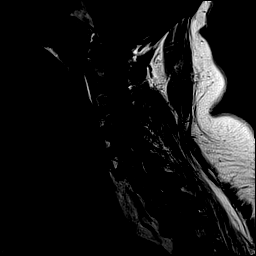
[im 12/15]
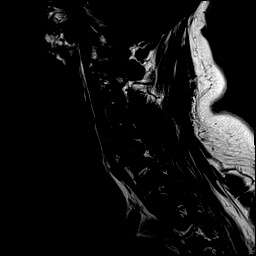
[im 15/15]
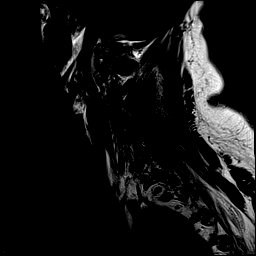

[Series 7: STIR · sagittal · 3.0mm · 0.62mm/px · 7 of 15 slices shown]
[im 1/15]
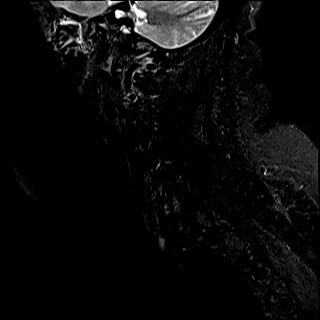
[im 3/15]
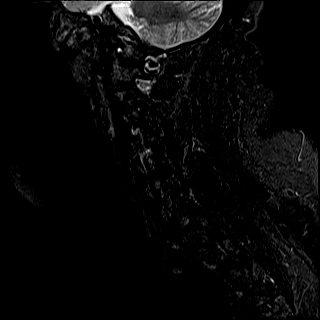
[im 5/15]
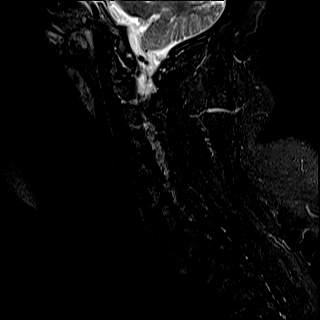
[im 8/15]
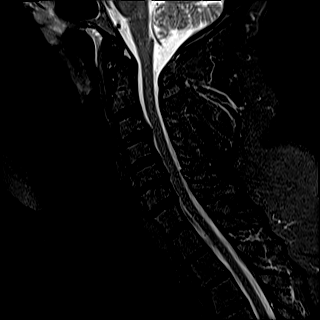
[im 10/15]
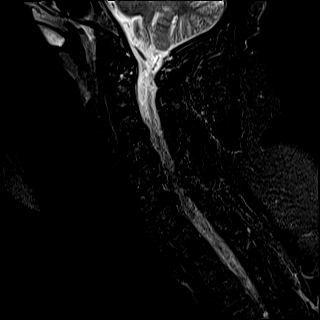
[im 12/15]
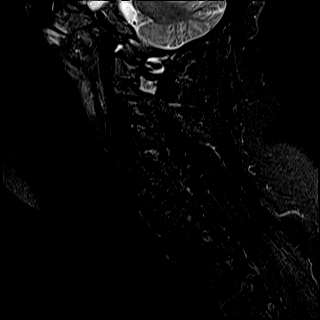
[im 15/15]
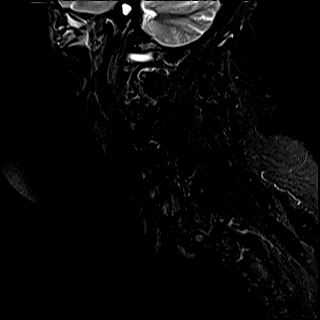

[Series 8: T2 · axial · 3.0mm · 0.70mm/px · z∈[-38,+51]mm · 10 of 29 slices shown (2 of 2)]
[im 1/29]
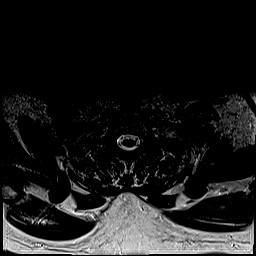
[im 3/29]
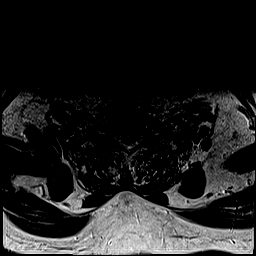
[im 5/29]
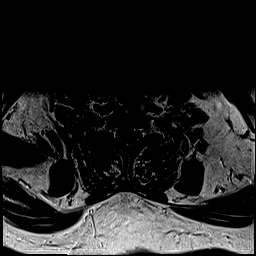
[im 7/29]
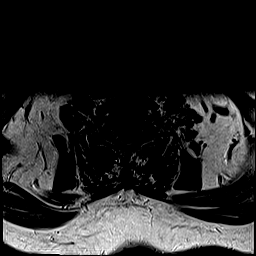
[im 9/29]
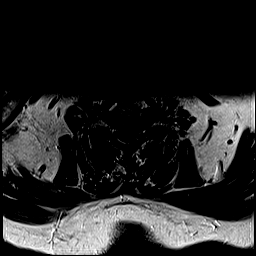
[im 13/29]
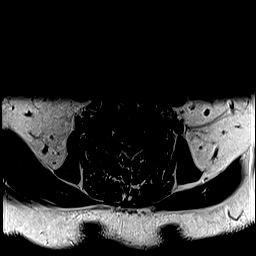
[im 16/29]
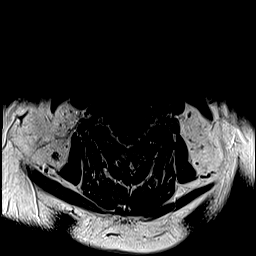
[im 20/29]
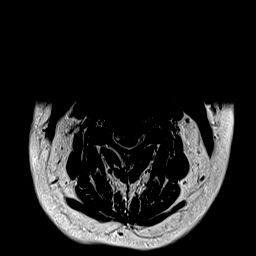
[im 24/29]
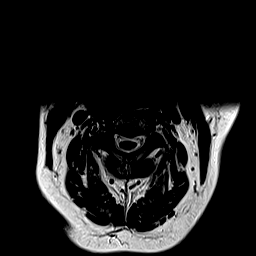
[im 29/29]
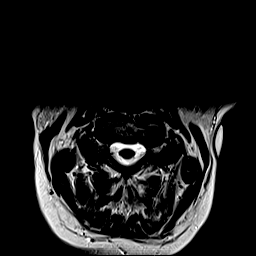

[Series 9: ax mpgr · axial · 3.0mm · 0.35mm/px · z∈[-38,+51]mm · 8 of 29 slices shown]
[im 1/29]
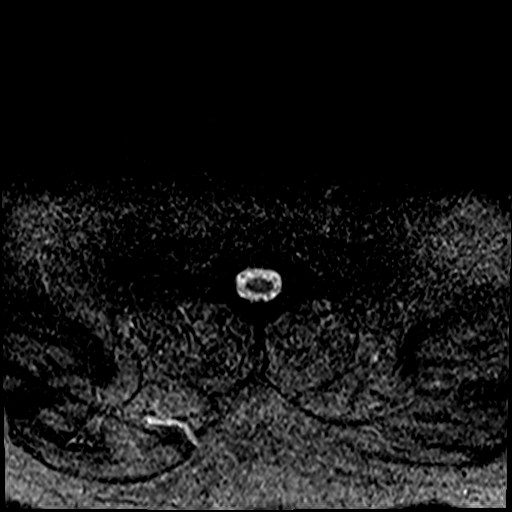
[im 5/29]
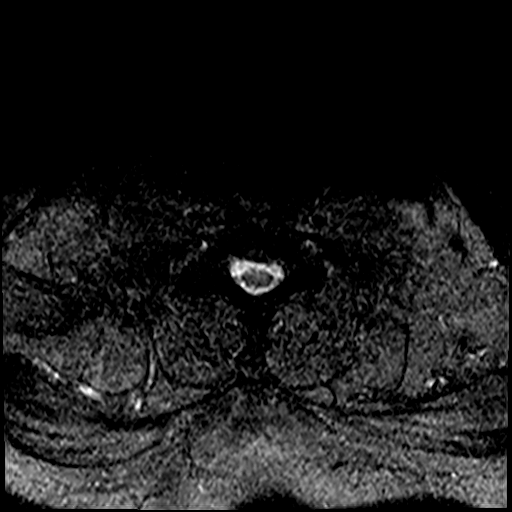
[im 9/29]
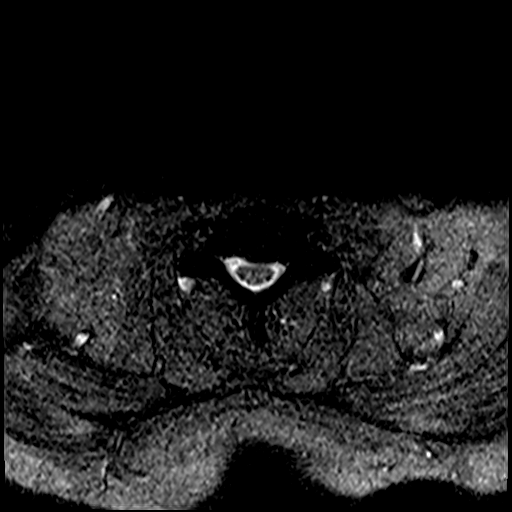
[im 13/29]
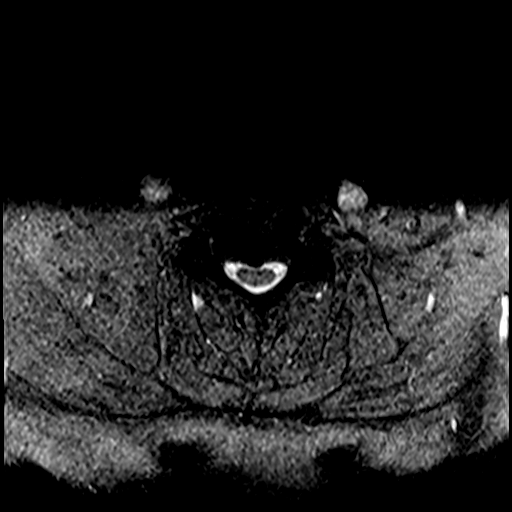
[im 16/29]
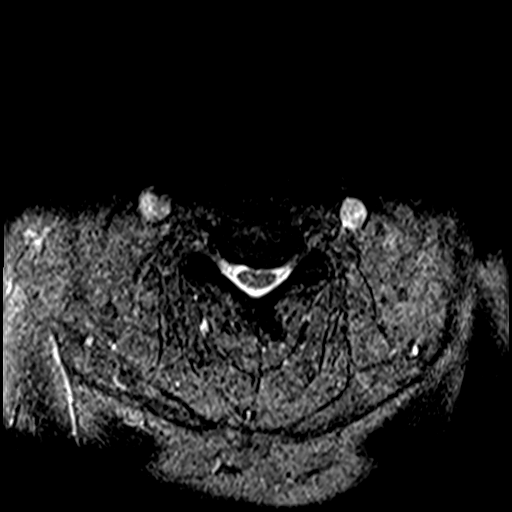
[im 20/29]
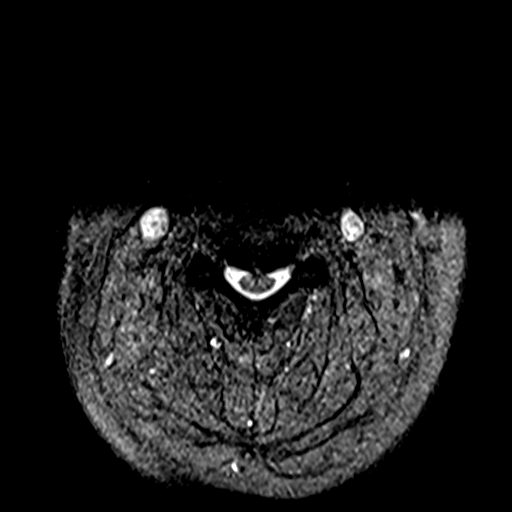
[im 24/29]
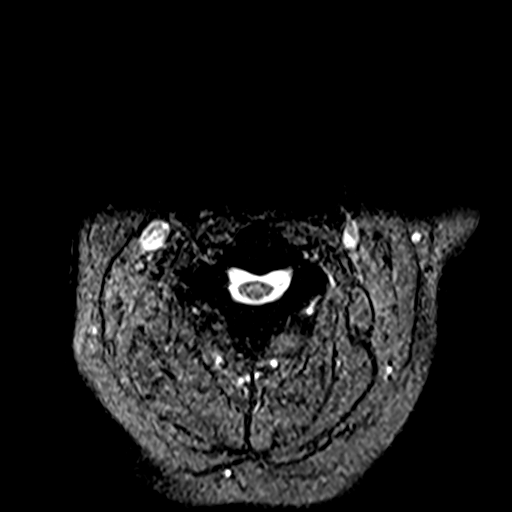
[im 29/29]
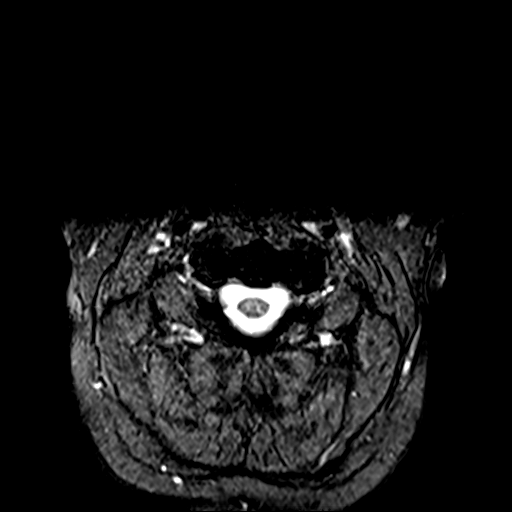

[38 of 48 positions shown; findings below may reference images not displayed]

FINDINGS: Alignment: Straightening of lordosis.  Grade 1 C5-6 retrolisthesis.

Vertebrae: Minimal Modic type 2 endplate degenerative changes.
Vertebral body heights are preserved. No aggressive osseous lesion.

Cord: Normal signal and morphology.

Posterior Fossa, vertebral arteries: Negative.

Disc levels: Multilevel desiccation.

C2-3: Shallow central protrusion. Bilateral facet hypertrophy.
Patent spinal canal and left neural foramen. Mild right neural
foraminal narrowing.

C3-4: Disc osteophyte complex with superimposed central protrusion
abutting/flattening the ventral cord. Uncovertebral/facet
hypertrophy. Mild spinal canal and bilateral neural foraminal
narrowing.

C4-5: Disc osteophyte complex with superimposed left paracentral
protrusion, uncovertebral and facet hypertrophy. Mild spinal canal
and bilateral neural foraminal narrowing.

C5-6: Disc osteophyte complex abutting/flattening the ventral cord
with uncovertebral/facet hypertrophy. Mild spinal canal and moderate
bilateral neural foraminal narrowing.

C6-7: Disc osteophyte complex with superimposed left paracentral
protrusion abutting the ventral cord. Uncovertebral and facet
degenerative spurring. Mild spinal canal, mild right and moderate
left neural foraminal narrowing.

C7-T1: Uncovertebral and facet degenerative spurring. No significant
disc bulge. Patent spinal canal. Mild bilateral neural foraminal
narrowing.

Paraspinal tissues: Negative.
IMPRESSION: Multilevel spondylosis.  Mild C3-7 spinal canal narrowing.

Moderate bilateral C5-6 and left C6-7 neural foraminal narrowing.

Otherwise mild neural foraminal narrowing at the remaining cervical
levels.

## 2020-03-15 MED ORDER — GADOBUTROL 1 MMOL/ML IV SOLN
9.0000 mL | Freq: Once | INTRAVENOUS | Status: AC | PRN
  Administered 2020-03-15: 9 mL via INTRAVENOUS

## 2020-03-18 ENCOUNTER — Ambulatory Visit: Payer: PRIVATE HEALTH INSURANCE | Admitting: Internal Medicine

## 2020-04-08 ENCOUNTER — Encounter: Payer: Self-pay | Admitting: Internal Medicine

## 2020-04-08 ENCOUNTER — Ambulatory Visit (INDEPENDENT_AMBULATORY_CARE_PROVIDER_SITE_OTHER): Payer: 59 | Admitting: Internal Medicine

## 2020-04-08 ENCOUNTER — Other Ambulatory Visit: Payer: Self-pay

## 2020-04-08 VITALS — BP 132/72 | HR 89 | Ht 61.0 in | Wt 214.0 lb

## 2020-04-08 DIAGNOSIS — E1142 Type 2 diabetes mellitus with diabetic polyneuropathy: Secondary | ICD-10-CM | POA: Diagnosis not present

## 2020-04-08 DIAGNOSIS — E785 Hyperlipidemia, unspecified: Secondary | ICD-10-CM

## 2020-04-08 DIAGNOSIS — F17290 Nicotine dependence, other tobacco product, uncomplicated: Secondary | ICD-10-CM

## 2020-04-08 DIAGNOSIS — E119 Type 2 diabetes mellitus without complications: Secondary | ICD-10-CM | POA: Diagnosis not present

## 2020-04-08 DIAGNOSIS — M5416 Radiculopathy, lumbar region: Secondary | ICD-10-CM | POA: Diagnosis not present

## 2020-04-08 LAB — GLUCOSE, POCT (MANUAL RESULT ENTRY): POC Glucose: 146 mg/dl — AB (ref 70–99)

## 2020-04-08 NOTE — Progress Notes (Unsigned)
Established Patient Office Visit  Subjective:  Patient ID: Courtney Henderson, female    DOB: 25-May-1960  Age: 60 y.o. MRN: [id number]  CC:  Chief Complaint  Patient presents with  . Diabetes    Patient is here for 3 month DM follow up    HPI  Etter Sjogren presents  Patient is known to have diabetic neuropathy with a lumbosacral spondylitis and radiculopathy.  Patient also has polyarthralgia and chronic pain syndrome patient is a cigar smoker and she was advised to cut it down.  She has a history of back surgery about 2 years ago. Past Medical History:  Diagnosis Date  . Complication of anesthesia    long time to wake up, headache  . Diabetes mellitus without complication ([place])   . Lymphedema    in legs/feet  . Neuromuscular disorder [facility])     Past Surgical History:  Procedure Laterality Date  . CATARACT EXTRACTION, BILATERAL    . JOINT REPLACEMENT Left    hip  . KYPHOPLASTY N/A 01/05/2019   Procedure: KYPHOPLASTY L5;  Surgeon: Hessie Knows, MD;  Location: [facility] ORS;  Service: Orthopedics;  Laterality: N/A;  . MULTIPLE TOOTH EXTRACTIONS  06/2018   2-3 weeks ago; 10 teeth  . TUBAL LIGATION      Family History  Problem Relation Age of Onset  . Heart disease Maternal Grandfather     Social History   Socioeconomic History  . Marital status: Married    Spouse name: Not on file  . Number of children: Not on file  . Years of education: Not on file  . Highest education level: Not on file  Occupational History  . Not on file  Tobacco Use  . Smoking status: Current Every Day Smoker    Packs/day: 1.50    Years: 40.00    Pack years: 60.00    Types: Cigarettes  . Smokeless tobacco: Never Used  Vaping Use  . Vaping Use: Never used  Substance and Sexual Activity  . Alcohol use: Not Currently    Alcohol/week: 0.0 standard drinks  . Drug use: Never  . Sexual activity: Not on file  Other Topics Concern  . Not on file  Social History Narrative  . Not on file    Social Determinants of Health   Financial Resource Strain: Not on file  Food Insecurity: Not on file  Transportation Needs: Not on file  Physical Activity: Not on file  Stress: Not on file  Social Connections: Not on file  Intimate Partner Violence: Not on file     Current Outpatient Medications:  .  Blood Glucose Monitoring Suppl (CONTOUR NEXT EZ MONITOR) w/Device KIT, 1 Device by Does not apply route in the morning and at bedtime. , Disp: , Rfl: 0 .  BREO ELLIPTA 100-25 MCG/INH AEPB, Inhale 1 puff into the lungs daily., Disp: 60 each, Rfl: 7 .  gabapentin (NEURONTIN) 600 MG tablet, Take 1 tablet (600 mg total) by mouth 3 (three) times daily., Disp: 90 tablet, Rfl: 2 .  glucose blood (ACCU-CHEK GUIDE) test strip, Use as instructed, Disp: 100 each, Rfl: 12 .  ibuprofen (ADVIL) 200 MG tablet, Take 200 mg by mouth every 6 (six) hours as needed., Disp: , Rfl:  .  latanoprost (XALATAN) 0.005 % ophthalmic solution, Place 1 drop into both eyes at bedtime., Disp: , Rfl:  .  metFORMIN (GLUCOPHAGE) 500 MG tablet, Take 1 tablet (500 mg total) by mouth 2 (two) times daily., Disp: 60 tablet, Rfl: 12 .  MICROLET LANCETS MISC, 1 Stick by Other route in the morning and at bedtime. , Disp: , Rfl: 6   Allergies  Allergen Reactions  . Amoxicillin Anaphylaxis  . Doxycycline Shortness Of Breath, Swelling and Other (See Comments)    Chest pain  . Morphine And Related Swelling  . Penicillins Swelling    Did it involve swelling of the face/tongue/throat, SOB, or low BP? Unknown Did it involve sudden or severe rash/hives, skin peeling, or any reaction on the inside of your mouth or nose? Unknown Did you need to seek medical attention at a hospital or doctor's office? Unknown When did it last happen?unkn  If all above answers are "NO", may proceed with cephalosporin use.     ROS Review of Systems  Constitutional: Negative.   HENT: Negative.   Eyes: Negative.   Respiratory: Negative.    Cardiovascular: Negative.   Gastrointestinal: Negative.   Endocrine: Negative.   Genitourinary: Negative.   Musculoskeletal: Negative.   Skin: Negative.   Allergic/Immunologic: Negative.   Neurological: Negative.   Hematological: Negative.   Psychiatric/Behavioral: Negative.   All other systems reviewed and are negative.     Objective:    Physical Exam Vitals reviewed.  Constitutional:      Appearance: Normal appearance.  HENT:     Mouth/Throat:     Mouth: Mucous membranes are moist.  Eyes:     General: No visual field deficit.    Pupils: Pupils are equal, round, and reactive to light.  Neck:     Vascular: No carotid bruit.  Cardiovascular:     Rate and Rhythm: Normal rate and regular rhythm.     Pulses: Normal pulses.     Heart sounds: Normal heart sounds.  Pulmonary:     Effort: Pulmonary effort is normal.     Breath sounds: Normal breath sounds.  Abdominal:     General: Bowel sounds are normal.     Palpations: Abdomen is soft. There is no hepatomegaly, splenomegaly or mass.     Tenderness: There is no abdominal tenderness.     Hernia: No hernia is present.  Musculoskeletal:        General: No tenderness.     Cervical back: Neck supple.     Right lower leg: No edema.     Left lower leg: No edema.     Comments: Scar  Of backsurgery  Healing well  Skin:    Findings: No rash.  Neurological:     Mental Status: She is alert and oriented to person, place, and time.     Cranial Nerves: No dysarthria.     Motor: No weakness or tremor.     Coordination: Coordination normal.  Psychiatric:        Mood and Affect: Mood and affect normal.        Behavior: Behavior normal.     BP 132/72   Pulse 89   Ht 5' 1"  (1.549 m)   Wt 214 lb (97.1 kg)   LMP  (LMP Unknown)   BMI 40.43 kg/m  Wt Readings from Last 3 Encounters:  04/08/20 214 lb (97.1 kg)  12/18/19 199 lb (90.3 kg)  04/27/19 198 lb 12.8 oz (90.2 kg)     Health Maintenance Due  Topic Date Due  .  Hepatitis C Screening  Never done  . PNEUMOCOCCAL POLYSACCHARIDE VACCINE AGE 59-64 HIGH RISK  Never done  . FOOT EXAM  Never done  . URINE MICROALBUMIN  Never done  . HIV Screening  Never done  . PAP SMEAR-Modifier  Never done  . MAMMOGRAM  Never done  . INFLUENZA VACCINE  08/20/2019  . COVID-19 Vaccine (3 - Booster for Pfizer series) 12/12/2019    There are no preventive care reminders to display for this patient.  Lab Results  Component Value Date   TSH 1.67 12/12/2019   Lab Results  Component Value Date   WBC 10.1 12/12/2019   HGB 14.4 12/12/2019   HCT 43.0 12/12/2019   MCV 97.7 12/12/2019   PLT 291 12/12/2019   Lab Results  Component Value Date   NA 142 12/12/2019   K 4.7 12/12/2019   CO2 24 12/12/2019   GLUCOSE 83 12/12/2019   BUN 10 12/12/2019   CREATININE 0.59 12/12/2019   BILITOT 0.5 12/12/2019   AST 19 12/12/2019   ALT 15 12/12/2019   PROT 6.8 12/12/2019   CALCIUM 9.5 12/12/2019   ANIONGAP 10 03/23/2019   Lab Results  Component Value Date   CHOL 205 (H) 12/12/2019   Lab Results  Component Value Date   HDL 63 12/12/2019   Lab Results  Component Value Date   LDLCALC 120 (H) 12/12/2019   Lab Results  Component Value Date   TRIG 114 12/12/2019   Lab Results  Component Value Date   CHOLHDL 3.3 12/12/2019   Lab Results  Component Value Date   HGBA1C 4.9 12/12/2019      Assessment & Plan:   Problem List Items Addressed This Visit      Endocrine   Diabetic polyneuropathy associated with type 2 diabetes mellitus ([place])    Patient was advised to continue using gabapentin      Type 2 diabetes mellitus without complication (HCC) - Primary   Relevant Orders   POCT glucose (manual entry) (Completed)     Nervous and Auditory   Lumbar radiculopathy    Stable        Other   Hyperlipidemia    Meal Ideas from meditreanean diet Breakfast:  Whole wheat toast or whole wheat English muffins with peanut butter & hard boiled egg Steel cut oats  topped with apples & cinnamon and skim milk  Fresh fruit: banana, strawberries, melon, berries, peaches  Smoothies: strawberries, bananas, greek yogurt, peanut butter Low fat greek yogurt with blueberries and granola  Egg white omelet with spinach and mushrooms Breakfast couscous: whole wheat couscous, apricots, skim milk, cranberries  Sandwiches:  Hummus and grilled vegetables (peppers, zucchini, squash) on whole wheat bread   Grilled chicken on whole wheat pita with lettuce, tomatoes, cucumbers or tzatziki  Tuna salad on whole wheat bread: tuna salad made with greek yogurt, olives, red peppers, capers, green onions Garlic rosemary lamb pita: lamb sauted with garlic, rosemary, salt & pepper; add lettuce, cucumber, greek yogurt to pita - flavor with lemon juice and black pepper  Seafood:  Mediterranean grilled salmon, seasoned with garlic, basil, parsley, lemon juice and black pepper Shrimp, lemon, and spinach whole-grain pasta salad made with low fat greek yogurt  Seared scallops with lemon orzo  Seared tuna steaks seasoned salt, pepper, coriander topped with tomato mixture of olives, tomatoes, olive oil, minced garlic, parsley, green onions and cappers  Meats:  Herbed greek chicken salad with kalamata olives, cucumber, feta  Red bell peppers stuffed with spinach, bulgur, lean ground beef (or lentils) & topped with feta   Kebabs: skewers of chicken, tomatoes, onions, zucchini, squash  [place] burgers: made with red onions, mint, dill, lemon juice, feta cheese topped with roasted red  peppers Vegetarian Cucumber salad: cucumbers, artichoke hearts, celery, red onion, feta cheese, tossed in olive oil & lemon juice  Hummus and whole grain pita points with a greek salad (lettuce, tomato, feta, olives, cucumbers, red onion) Lentil soup with celery, carrots made with vegetable broth, garlic, salt and pepper  Tabouli salad: parsley, bulgur, mint, scallions, cucumbers, tomato, radishes, lemon juice,  olive oil, salt and pepper.      Cigar smoker    - I instructed the patient to stop smoking and provided them with smoking cessation materials.  - I informed the patient that smoking puts them at increased risk for cancer, COPD, hypertension, and more.  - Informed the patient to seek help if they begin to have trouble breathing, develop chest pain, start to cough up blood, feel faint, or pass out.       Patient was advised to continue gabapentin.  She was advised to quit smoking.  Lose weight.  Take vitamin C every day because of ecchymosis, blood pressure count is okay.  No orders of the defined types were placed in this encounter.   Follow-up: No follow-ups on file.    Cletis Athens, MD

## 2020-04-09 ENCOUNTER — Other Ambulatory Visit: Payer: Self-pay | Admitting: Neurosurgery

## 2020-04-09 ENCOUNTER — Other Ambulatory Visit (HOSPITAL_COMMUNITY): Payer: Self-pay | Admitting: Neurosurgery

## 2020-04-09 DIAGNOSIS — R29898 Other symptoms and signs involving the musculoskeletal system: Secondary | ICD-10-CM

## 2020-04-11 NOTE — Assessment & Plan Note (Signed)
Meal Ideas from meditreanean diet Breakfast:  Whole wheat toast or whole wheat English muffins with peanut butter & hard boiled egg Steel cut oats topped with apples & cinnamon and skim milk  Fresh fruit: banana, strawberries, melon, berries, peaches  Smoothies: strawberries, bananas, greek yogurt, peanut butter Low fat greek yogurt with blueberries and granola  Egg white omelet with spinach and mushrooms Breakfast couscous: whole wheat couscous, apricots, skim milk, cranberries  Sandwiches:  Hummus and grilled vegetables (peppers, zucchini, squash) on whole wheat bread   Grilled chicken on whole wheat pita with lettuce, tomatoes, cucumbers or tzatziki  [place] salad on whole wheat bread: tuna salad made with greek yogurt, olives, red peppers, capers, green onions Garlic rosemary lamb pita: lamb sauted with garlic, rosemary, salt & pepper; add lettuce, cucumber, greek yogurt to pita - flavor with lemon juice and black pepper  Seafood:  Mediterranean grilled salmon, seasoned with garlic, basil, parsley, lemon juice and black pepper Shrimp, lemon, and spinach whole-grain pasta salad made with low fat greek yogurt  Seared scallops with lemon orzo  Seared tuna steaks seasoned salt, pepper, coriander topped with tomato mixture of olives, tomatoes, olive oil, minced garlic, parsley, green onions and cappers  Meats:  Herbed greek chicken salad with kalamata olives, cucumber, feta  Red bell peppers stuffed with spinach, bulgur, lean ground beef (or lentils) & topped with feta   Kebabs: skewers of chicken, tomatoes, onions, zucchini, squash  [place] burgers: made with red onions, mint, dill, lemon juice, feta cheese topped with roasted red peppers Vegetarian Cucumber salad: cucumbers, artichoke hearts, celery, red onion, feta cheese, tossed in olive oil & lemon juice  Hummus and whole grain pita points with a greek salad (lettuce, tomato, feta, olives, cucumbers, red onion) Lentil soup with celery,  carrots made with vegetable broth, garlic, salt and pepper  Tabouli salad: parsley, bulgur, mint, scallions, cucumbers, tomato, radishes, lemon juice, olive oil, salt and pepper.

## 2020-04-11 NOTE — Assessment & Plan Note (Signed)
Stable

## 2020-04-11 NOTE — Assessment & Plan Note (Signed)
Patient was advised to continue using gabapentin 

## 2020-04-11 NOTE — Assessment & Plan Note (Signed)
-   I instructed the patient to stop smoking and provided them with smoking cessation materials.  - I informed the patient that smoking puts them at increased risk for cancer, COPD, hypertension, and more.  - Informed the patient to seek help if they begin to have trouble breathing, develop chest pain, start to cough up blood, feel faint, or pass out.  

## 2020-04-15 ENCOUNTER — Other Ambulatory Visit: Payer: Self-pay | Admitting: *Deleted

## 2020-04-15 MED ORDER — GABAPENTIN 600 MG PO TABS: 600.0000 mg | ORAL_TABLET | Freq: Three times a day (TID) | ORAL | 2 refills | Status: AC

## 2020-04-24 ENCOUNTER — Ambulatory Visit
Admission: RE | Admit: 2020-04-24 | Discharge: 2020-04-24 | Disposition: A | Discharge status: Home / Self Care | Payer: 59 | Source: Ambulatory Visit | Attending: Neurosurgery | Admitting: Neurosurgery

## 2020-04-24 ENCOUNTER — Other Ambulatory Visit: Payer: Self-pay

## 2020-04-24 DIAGNOSIS — R29898 Other symptoms and signs involving the musculoskeletal system: Secondary | ICD-10-CM | POA: Diagnosis present

## 2020-04-24 IMAGING — MR MR THORACIC SPINE W/O CM
6 series · 29 of 48 positions shown · non-contrast
Comparison: Radiographs [DATE].

CLINICAL DATA: Bilateral hand and feet numbness for multiple years.

EXAM:
MRI THORACIC SPINE WITHOUT CONTRAST
TECHNIQUE: Multiplanar, multisequence MR imaging of the thoracic spine was
performed. No intravenous contrast was administered.

[Series 16: T1 · sagittal · 5.0mm · 1.88mm/px · 2 of 9 slices shown (1 of 2)]
[im 1/9]
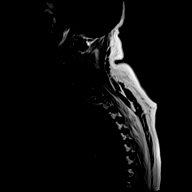
[im 9/9]
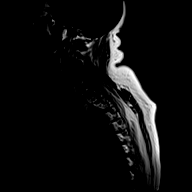

[Series 17: T2 · sagittal · 3.0mm · 1.06mm/px · 6 of 17 slices shown (1 of 2)]
[im 1/17]
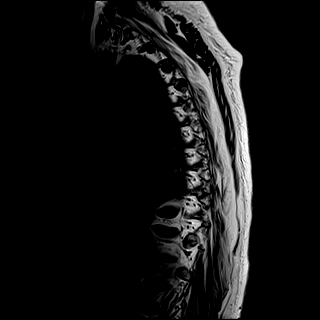
[im 4/17]
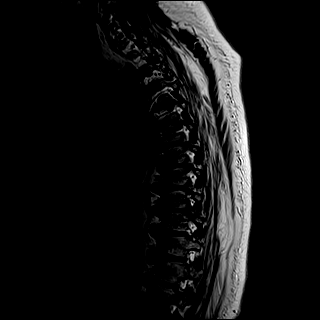
[im 7/17]
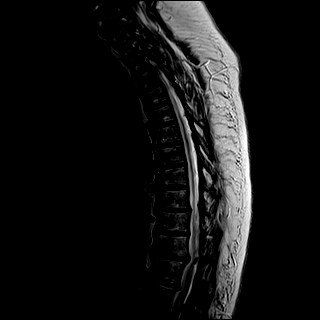
[im 10/17]
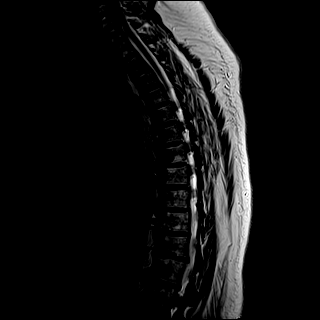
[im 13/17]
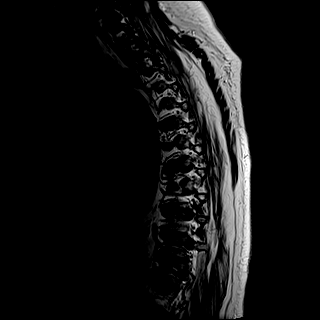
[im 17/17]
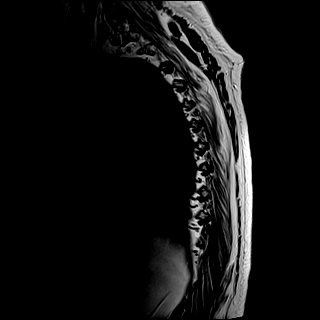

[Series 18: T1 · sagittal · 3.0mm · 1.06mm/px · 6 of 17 slices shown (2 of 2)]
[im 1/17]
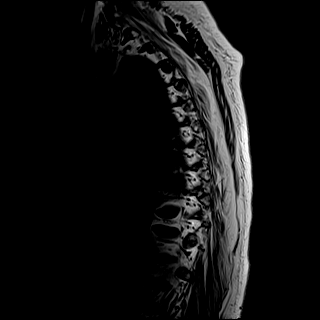
[im 4/17]
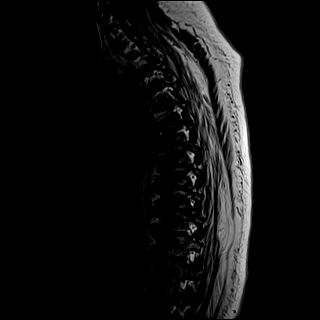
[im 7/17]
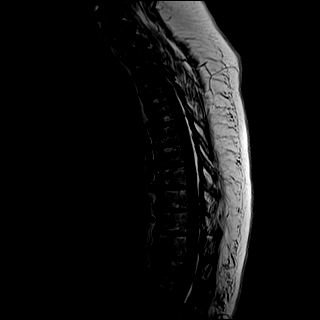
[im 10/17]
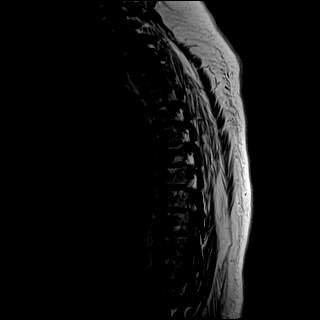
[im 13/17]
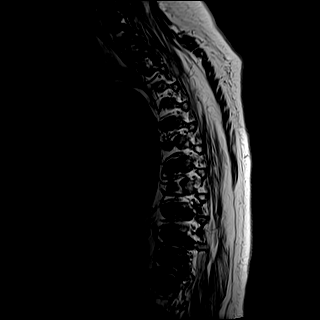
[im 17/17]
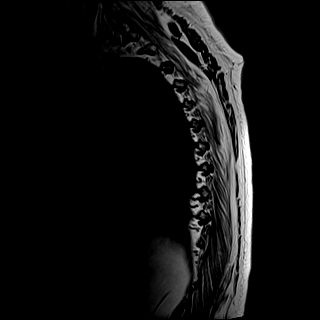

[Series 19: STIR · sagittal · 3.0mm · 0.53mm/px · 6 of 17 slices shown]
[im 1/17]
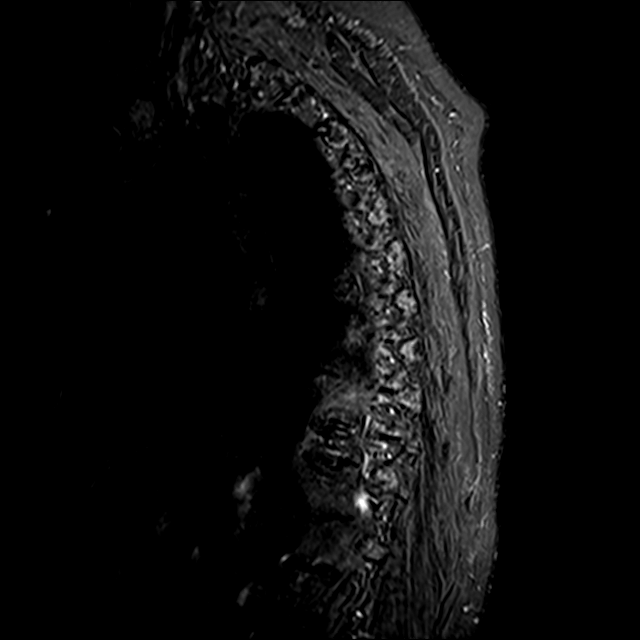
[im 4/17]
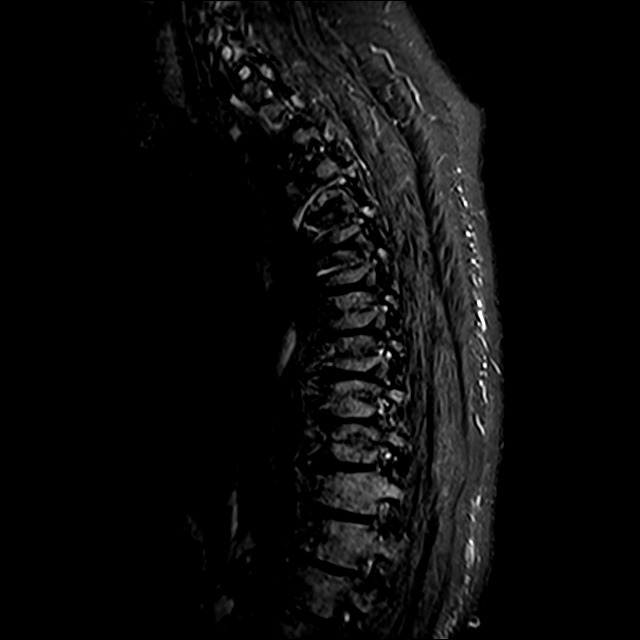
[im 7/17]
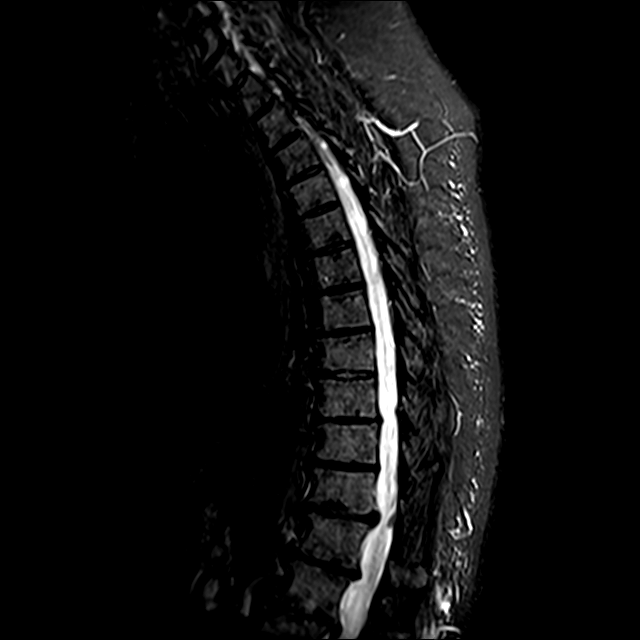
[im 10/17]
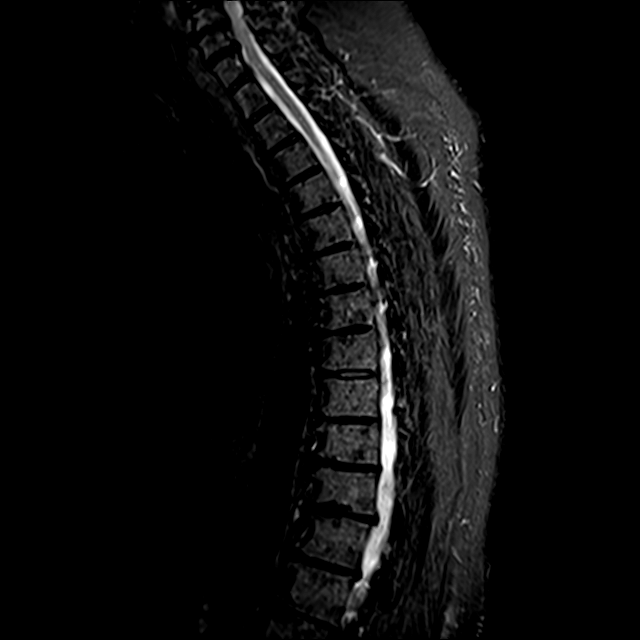
[im 13/17]
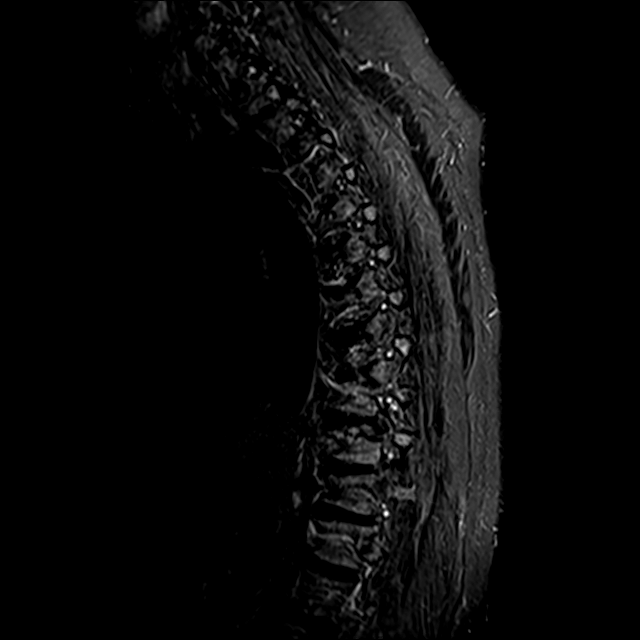
[im 17/17]
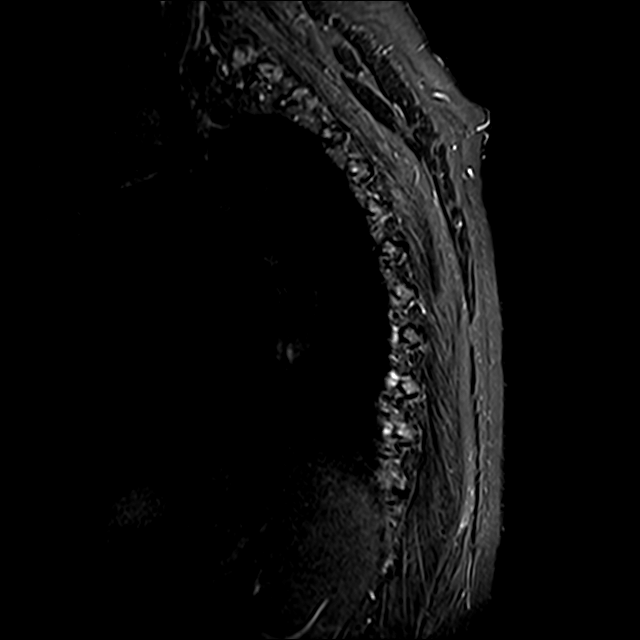

[Series 20: T2 · axial · 4.0mm · 0.59mm/px · z∈[-321,-125]mm · 8 of 39 slices shown (2 of 2)]
[im 1/39]
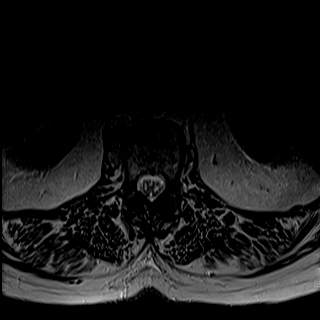
[im 6/39]
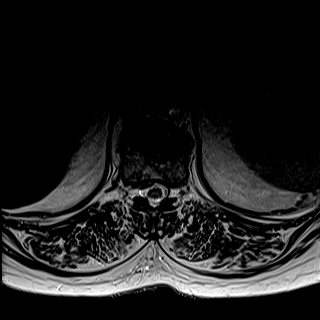
[im 12/39]
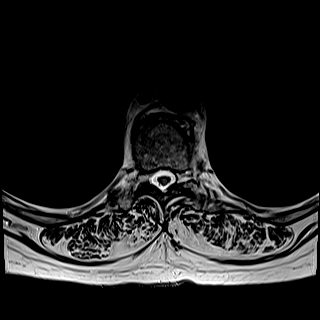
[im 18/39]
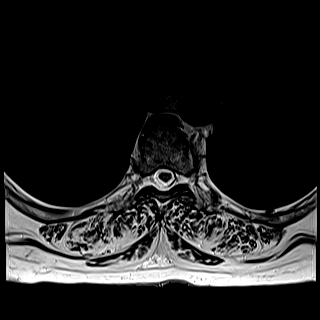
[im 21/39]
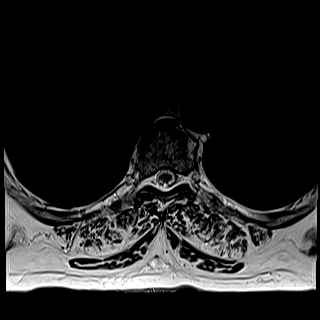
[im 27/39]
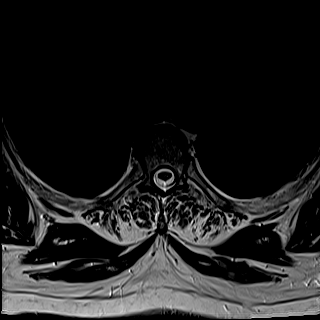
[im 33/39]
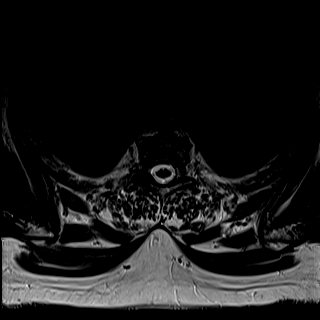
[im 39/39]
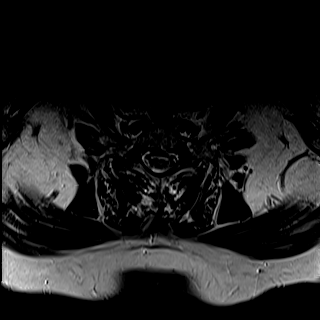

[Series 21: GRE · axial · 4.0mm · 0.37mm/px · 1 of 39 slices shown]
[im 1/39]
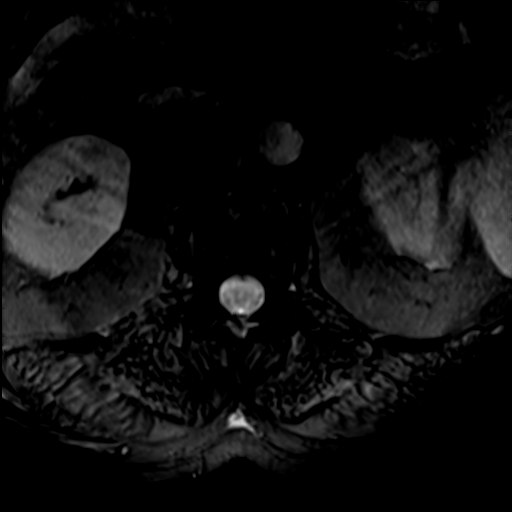

[29 of 48 positions shown; findings below may reference images not displayed]

FINDINGS: Alignment:  Normal.

Vertebrae: Diffuse marrow heterogeneity without discrete marrow
replacing lesion. Degenerative Schmorl's nodes at multiple levels,
largest involving the inferior T11 and T12 endplates. Vertebral body
heights are maintained. No specific evidence of acute fracture or
discitis/osteomyelitis.

Cord:  Normal cord signal.

Paraspinal and other soft tissues: Negative.

Disc levels:

Partially imaged degenerative changes in the cervical spine, better
characterized on MRI from [DATE].

Mild disc height loss throughout the thoracic spine.

Right eccentric disc/osteophyte at T1-T2 partially effaces ventral
CSF without significant canal stenosis.

Right greater than left facet hypertrophy at T2-T3 with mild right
foraminal stenosis.

Posterior disc bulging and endplate spurring with right greater than
left facet hypertrophy at T9-T10 results in mild right foraminal
stenosis without significant canal stenosis.

Posterior disc bulging and bilateral facet hypertrophy at T10-T11
and T11-T12 without significant canal or foraminal stenosis. At
T11-T12 the right eccentric disc contacts and indents the ventral
cord.
IMPRESSION: 1. Multilevel disc protrusions/osteophytes (as detailed above)
without significant canal stenosis. At T11-T12 right eccentric disc
protrusion contacts and flattens the ventral cord.
2. Multilevel facet hypertrophy with mild foraminal stenosis on the
right at T2-T3 and T9-T10.
3. Diffuse marrow heterogeneity without discrete marrow replacing
lesion. Findings are nonspecific but can be seen in the setting of
chronic anemia or chronic hypoxia (such as in smokers).

## 2020-04-29 ENCOUNTER — Ambulatory Visit: Payer: 59 | Admitting: Podiatry

## 2020-04-29 ENCOUNTER — Encounter: Payer: Self-pay | Admitting: Podiatry

## 2020-04-29 ENCOUNTER — Other Ambulatory Visit: Payer: Self-pay

## 2020-04-29 DIAGNOSIS — M722 Plantar fascial fibromatosis: Secondary | ICD-10-CM | POA: Diagnosis not present

## 2020-04-29 DIAGNOSIS — M778 Other enthesopathies, not elsewhere classified: Secondary | ICD-10-CM | POA: Diagnosis not present

## 2020-04-29 MED ORDER — TRIAMCINOLONE ACETONIDE 40 MG/ML IJ SUSP
40.0000 mg | Freq: Once | INTRAMUSCULAR | Status: AC
  Administered 2020-04-29: 40 mg

## 2020-04-29 MED ORDER — DEXAMETHASONE SODIUM PHOSPHATE 120 MG/30ML IJ SOLN
2.0000 mg | Freq: Once | INTRAMUSCULAR | Status: AC
  Administered 2020-04-29: 2 mg via INTRA_ARTICULAR

## 2020-04-29 NOTE — Progress Notes (Signed)
She presents today chief complaint of bilateral heel pain.  She states that is bothersome mobic.  She points to the fourth fifth tarsometatarsal joint of the right foot as well.  Objective: Vital signs are stable alert oriented x3 pain on palpation medial calcaneal tubercles bilateral.  She also has pain on range of motion and palpation of the fourth fifth TMT joint right.  Assessment: Capsulitis right foot.  Plantar fasciitis bilateral.  Plan: Injected the bilateral heels today with 10 mg Kenalog 5 mg Marcaine for maximal tenderness.  Also injected around the fifth metatarsal and subtalar joint area 10 mg of Kenalog 5 mg Marcaine.  Hopefully this will help alleviate her symptoms I will follow-up with her in about 3 months

## 2020-05-13 ENCOUNTER — Emergency Department: Payer: 59

## 2020-05-13 ENCOUNTER — Inpatient Hospital Stay: Payer: 59

## 2020-05-13 ENCOUNTER — Encounter: Admission: EM | Disposition: E | Payer: Self-pay | Source: Home / Self Care | Attending: Internal Medicine

## 2020-05-13 ENCOUNTER — Inpatient Hospital Stay
Admission: EM | Admit: 2020-05-13 | Discharge: 2020-05-19 | DRG: 853 | Disposition: E | Payer: 59 | Attending: Internal Medicine | Admitting: Internal Medicine

## 2020-05-13 ENCOUNTER — Encounter: Payer: Self-pay | Admitting: Cardiovascular Disease

## 2020-05-13 DIAGNOSIS — U071 COVID-19: Secondary | ICD-10-CM | POA: Diagnosis present

## 2020-05-13 DIAGNOSIS — Z96642 Presence of left artificial hip joint: Secondary | ICD-10-CM | POA: Diagnosis present

## 2020-05-13 DIAGNOSIS — J95850 Mechanical complication of respirator: Secondary | ICD-10-CM

## 2020-05-13 DIAGNOSIS — G928 Other toxic encephalopathy: Secondary | ICD-10-CM | POA: Diagnosis present

## 2020-05-13 DIAGNOSIS — Z978 Presence of other specified devices: Secondary | ICD-10-CM

## 2020-05-13 DIAGNOSIS — Z515 Encounter for palliative care: Secondary | ICD-10-CM | POA: Diagnosis not present

## 2020-05-13 DIAGNOSIS — R570 Cardiogenic shock: Secondary | ICD-10-CM | POA: Diagnosis present

## 2020-05-13 DIAGNOSIS — Z66 Do not resuscitate: Secondary | ICD-10-CM | POA: Diagnosis not present

## 2020-05-13 DIAGNOSIS — I2102 ST elevation (STEMI) myocardial infarction involving left anterior descending coronary artery: Secondary | ICD-10-CM | POA: Diagnosis not present

## 2020-05-13 DIAGNOSIS — Z8249 Family history of ischemic heart disease and other diseases of the circulatory system: Secondary | ICD-10-CM

## 2020-05-13 DIAGNOSIS — Z880 Allergy status to penicillin: Secondary | ICD-10-CM

## 2020-05-13 DIAGNOSIS — A4189 Other specified sepsis: Secondary | ICD-10-CM | POA: Diagnosis present

## 2020-05-13 DIAGNOSIS — F17210 Nicotine dependence, cigarettes, uncomplicated: Secondary | ICD-10-CM | POA: Diagnosis present

## 2020-05-13 DIAGNOSIS — Z885 Allergy status to narcotic agent status: Secondary | ICD-10-CM

## 2020-05-13 DIAGNOSIS — Z7951 Long term (current) use of inhaled steroids: Secondary | ICD-10-CM | POA: Diagnosis not present

## 2020-05-13 DIAGNOSIS — J1282 Pneumonia due to coronavirus disease 2019: Secondary | ICD-10-CM | POA: Diagnosis present

## 2020-05-13 DIAGNOSIS — E1142 Type 2 diabetes mellitus with diabetic polyneuropathy: Secondary | ICD-10-CM | POA: Diagnosis present

## 2020-05-13 DIAGNOSIS — I472 Ventricular tachycardia: Secondary | ICD-10-CM | POA: Diagnosis present

## 2020-05-13 DIAGNOSIS — R6521 Severe sepsis with septic shock: Secondary | ICD-10-CM | POA: Diagnosis present

## 2020-05-13 DIAGNOSIS — D688 Other specified coagulation defects: Secondary | ICD-10-CM | POA: Diagnosis present

## 2020-05-13 DIAGNOSIS — N179 Acute kidney failure, unspecified: Secondary | ICD-10-CM | POA: Diagnosis present

## 2020-05-13 DIAGNOSIS — I5189 Other ill-defined heart diseases: Secondary | ICD-10-CM | POA: Diagnosis not present

## 2020-05-13 DIAGNOSIS — I429 Cardiomyopathy, unspecified: Secondary | ICD-10-CM | POA: Diagnosis present

## 2020-05-13 DIAGNOSIS — J80 Acute respiratory distress syndrome: Secondary | ICD-10-CM | POA: Diagnosis present

## 2020-05-13 DIAGNOSIS — I2119 ST elevation (STEMI) myocardial infarction involving other coronary artery of inferior wall: Secondary | ICD-10-CM | POA: Diagnosis present

## 2020-05-13 DIAGNOSIS — G894 Chronic pain syndrome: Secondary | ICD-10-CM | POA: Diagnosis present

## 2020-05-13 DIAGNOSIS — I509 Heart failure, unspecified: Secondary | ICD-10-CM

## 2020-05-13 DIAGNOSIS — E119 Type 2 diabetes mellitus without complications: Secondary | ICD-10-CM

## 2020-05-13 DIAGNOSIS — E785 Hyperlipidemia, unspecified: Secondary | ICD-10-CM | POA: Diagnosis present

## 2020-05-13 DIAGNOSIS — I2109 ST elevation (STEMI) myocardial infarction involving other coronary artery of anterior wall: Secondary | ICD-10-CM | POA: Diagnosis present

## 2020-05-13 DIAGNOSIS — R0902 Hypoxemia: Secondary | ICD-10-CM

## 2020-05-13 DIAGNOSIS — I272 Pulmonary hypertension, unspecified: Secondary | ICD-10-CM | POA: Diagnosis present

## 2020-05-13 DIAGNOSIS — J9601 Acute respiratory failure with hypoxia: Secondary | ICD-10-CM | POA: Diagnosis not present

## 2020-05-13 DIAGNOSIS — I2510 Atherosclerotic heart disease of native coronary artery without angina pectoris: Secondary | ICD-10-CM

## 2020-05-13 DIAGNOSIS — I5021 Acute systolic (congestive) heart failure: Secondary | ICD-10-CM | POA: Diagnosis present

## 2020-05-13 DIAGNOSIS — Z7984 Long term (current) use of oral hypoglycemic drugs: Secondary | ICD-10-CM | POA: Diagnosis not present

## 2020-05-13 DIAGNOSIS — Z79899 Other long term (current) drug therapy: Secondary | ICD-10-CM

## 2020-05-13 DIAGNOSIS — Z881 Allergy status to other antibiotic agents status: Secondary | ICD-10-CM

## 2020-05-13 DIAGNOSIS — R579 Shock, unspecified: Secondary | ICD-10-CM | POA: Diagnosis not present

## 2020-05-13 HISTORY — PX: LEFT HEART CATH AND CORONARY ANGIOGRAPHY: CATH118249

## 2020-05-13 HISTORY — PX: CORONARY/GRAFT ACUTE MI REVASCULARIZATION: CATH118305

## 2020-05-13 LAB — CBC WITH DIFFERENTIAL/PLATELET
Abs Immature Granulocytes: 0.52 10*3/uL — ABNORMAL HIGH (ref 0.00–0.07)
Basophils Absolute: 0.2 10*3/uL — ABNORMAL HIGH (ref 0.0–0.1)
Basophils Relative: 1 %
Eosinophils Absolute: 0 10*3/uL (ref 0.0–0.5)
Eosinophils Relative: 0 %
HCT: 48.4 % — ABNORMAL HIGH (ref 36.0–46.0)
Hemoglobin: 16.2 g/dL — ABNORMAL HIGH (ref 12.0–15.0)
Immature Granulocytes: 2 %
Lymphocytes Relative: 11 %
Lymphs Abs: 2.6 10*3/uL (ref 0.7–4.0)
MCH: 33.5 pg (ref 26.0–34.0)
MCHC: 33.5 g/dL (ref 30.0–36.0)
MCV: 100.2 fL — ABNORMAL HIGH (ref 80.0–100.0)
Monocytes Absolute: 0.5 10*3/uL (ref 0.1–1.0)
Monocytes Relative: 2 %
Neutro Abs: 20.1 10*3/uL — ABNORMAL HIGH (ref 1.7–7.7)
Neutrophils Relative %: 84 %
Platelets: 321 10*3/uL (ref 150–400)
RBC: 4.83 MIL/uL (ref 3.87–5.11)
RDW: 16.3 % — ABNORMAL HIGH (ref 11.5–15.5)
Smear Review: NORMAL
WBC: 24 10*3/uL — ABNORMAL HIGH (ref 4.0–10.5)
nRBC: 0.5 % — ABNORMAL HIGH (ref 0.0–0.2)

## 2020-05-13 LAB — BLOOD GAS, ARTERIAL
Acid-base deficit: 14.4 mmol/L — ABNORMAL HIGH (ref 0.0–2.0)
Bicarbonate: 16.4 mmol/L — ABNORMAL LOW (ref 20.0–28.0)
FIO2: 1
MECHVT: 450 mL
O2 Saturation: 95.3 %
PEEP: 5 cmH2O
Patient temperature: 37
RATE: 20 resp/min
pCO2 arterial: 58 mmHg — ABNORMAL HIGH (ref 32.0–48.0)
pH, Arterial: 7.06 — CL (ref 7.350–7.450)
pO2, Arterial: 107 mmHg (ref 83.0–108.0)

## 2020-05-13 LAB — CBC
HCT: 47.2 % — ABNORMAL HIGH (ref 36.0–46.0)
Hemoglobin: 16.2 g/dL — ABNORMAL HIGH (ref 12.0–15.0)
MCH: 34.1 pg — ABNORMAL HIGH (ref 26.0–34.0)
MCHC: 34.3 g/dL (ref 30.0–36.0)
MCV: 99.4 fL (ref 80.0–100.0)
Platelets: 391 10*3/uL (ref 150–400)
RBC: 4.75 MIL/uL (ref 3.87–5.11)
RDW: 16.5 % — ABNORMAL HIGH (ref 11.5–15.5)
WBC: 32.1 10*3/uL — ABNORMAL HIGH (ref 4.0–10.5)
nRBC: 1.1 % — ABNORMAL HIGH (ref 0.0–0.2)

## 2020-05-13 LAB — RESP PANEL BY RT-PCR (FLU A&B, COVID) ARPGX2
Influenza A by PCR: NEGATIVE
Influenza B by PCR: NEGATIVE
SARS Coronavirus 2 by RT PCR: POSITIVE — AB

## 2020-05-13 LAB — LIPID PANEL
Cholesterol: 143 mg/dL (ref 0–200)
HDL: 47 mg/dL (ref >40)
LDL Cholesterol: 68 mg/dL (ref 0–99)
Total CHOL/HDL Ratio: 3 RATIO
Triglycerides: 140 mg/dL (ref <150)
VLDL: 28 mg/dL (ref 0–40)

## 2020-05-13 LAB — COMPREHENSIVE METABOLIC PANEL
ALT: 23 U/L (ref 0–44)
AST: 48 U/L — ABNORMAL HIGH (ref 15–41)
Albumin: 3.4 g/dL — ABNORMAL LOW (ref 3.5–5.0)
Alkaline Phosphatase: 64 U/L (ref 38–126)
Anion gap: 17 — ABNORMAL HIGH (ref 5–15)
BUN: 15 mg/dL (ref 8–23)
CO2: 17 mmol/L — ABNORMAL LOW (ref 22–32)
Calcium: 8.9 mg/dL (ref 8.9–10.3)
Chloride: 105 mmol/L (ref 98–111)
Creatinine, Ser: 1.54 mg/dL — ABNORMAL HIGH (ref 0.44–1.00)
GFR, Estimated: 38 mL/min — ABNORMAL LOW (ref >60)
Glucose, Bld: 193 mg/dL — ABNORMAL HIGH (ref 70–99)
Potassium: 3.6 mmol/L (ref 3.5–5.1)
Sodium: 139 mmol/L (ref 135–145)
Total Bilirubin: 0.9 mg/dL (ref 0.3–1.2)
Total Protein: 6.4 g/dL — ABNORMAL LOW (ref 6.5–8.1)

## 2020-05-13 LAB — PROTIME-INR
INR: 1.3 — ABNORMAL HIGH (ref 0.8–1.2)
Prothrombin Time: 16.2 seconds — ABNORMAL HIGH (ref 11.4–15.2)

## 2020-05-13 LAB — CREATININE, SERUM
Creatinine, Ser: 1.6 mg/dL — ABNORMAL HIGH (ref 0.44–1.00)
GFR, Estimated: 36 mL/min — ABNORMAL LOW (ref >60)

## 2020-05-13 LAB — MRSA PCR SCREENING: MRSA by PCR: NEGATIVE

## 2020-05-13 LAB — TROPONIN I (HIGH SENSITIVITY)
Troponin I (High Sensitivity): 3486 ng/L (ref <18)
Troponin I (High Sensitivity): >27000 ng/L (ref <18)

## 2020-05-13 LAB — APTT: aPTT: 63 seconds — ABNORMAL HIGH (ref 24–36)

## 2020-05-13 IMAGING — DX DG CHEST 1V PORT
1 series · 2 of 2 positions shown · non-contrast
Comparison: [DATE]

CLINICAL DATA: STEMI, respiratory failure

EXAM:
PORTABLE CHEST 1 VIEW

[Series 1: chest ap · 0.14mm/px · 2 of 2 slices shown]
[im 1/2]
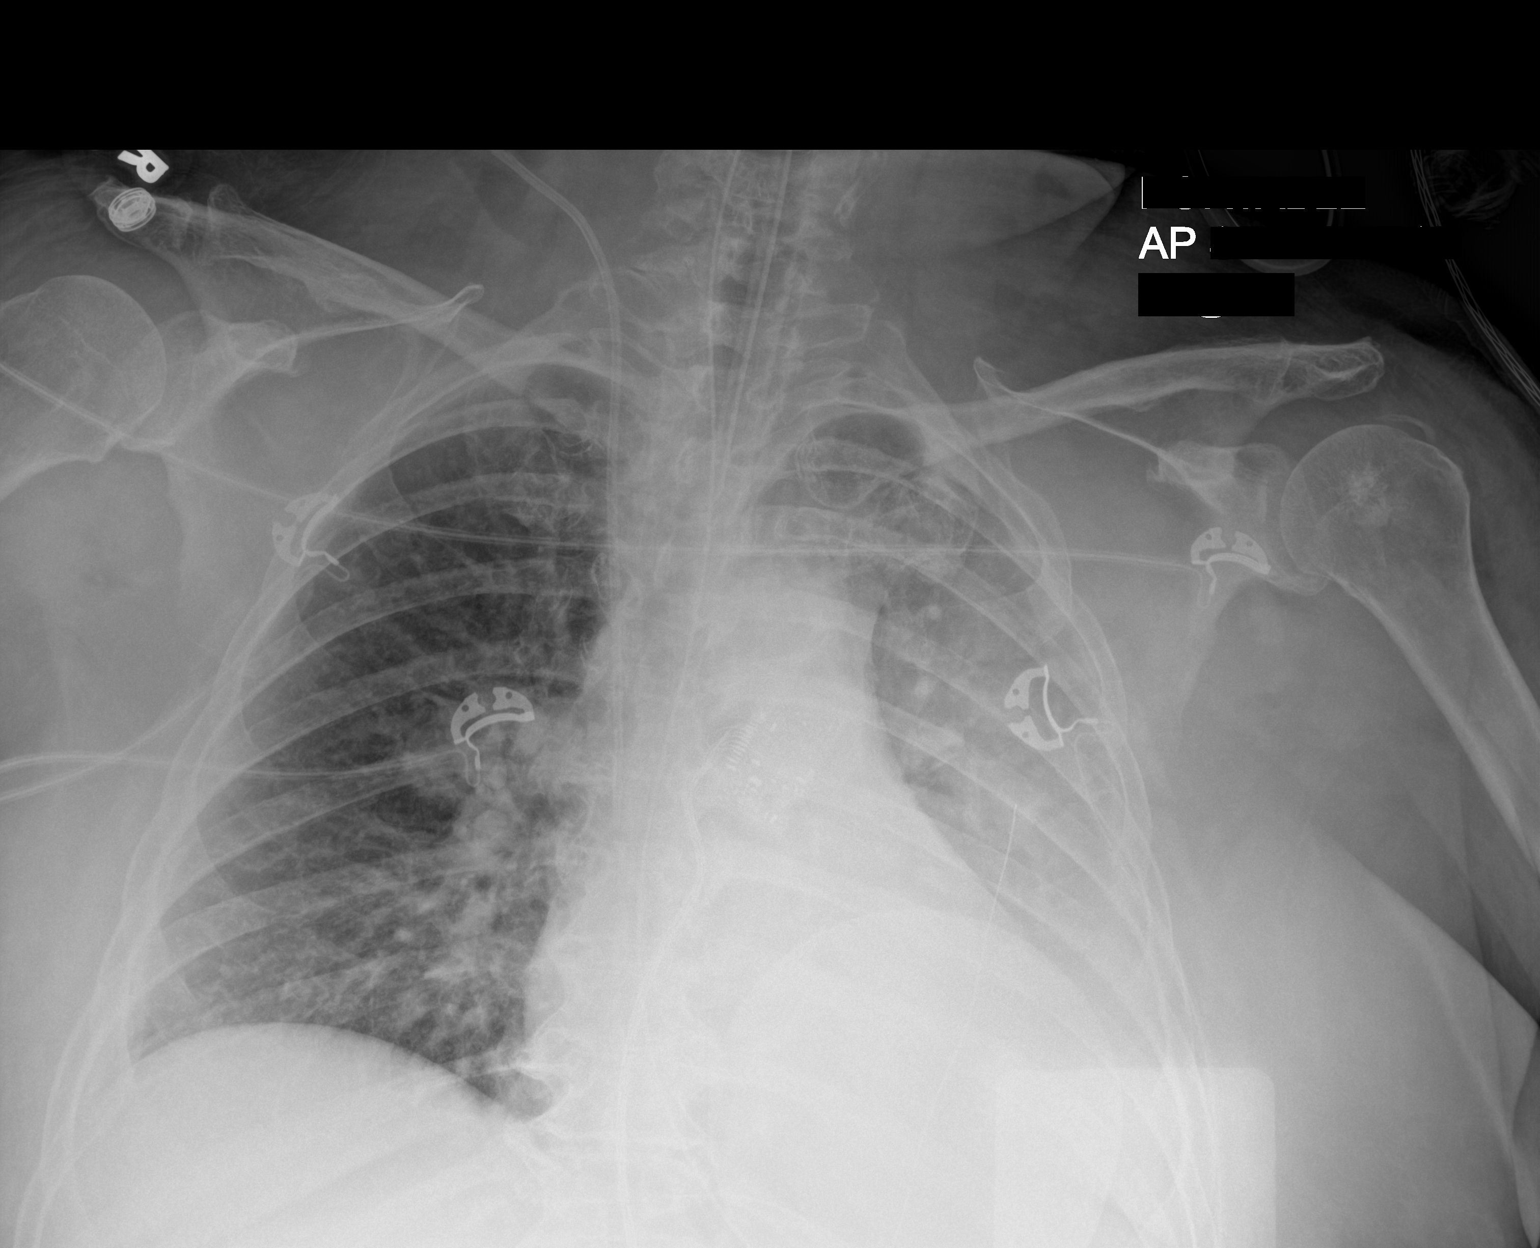
[im 2/2]
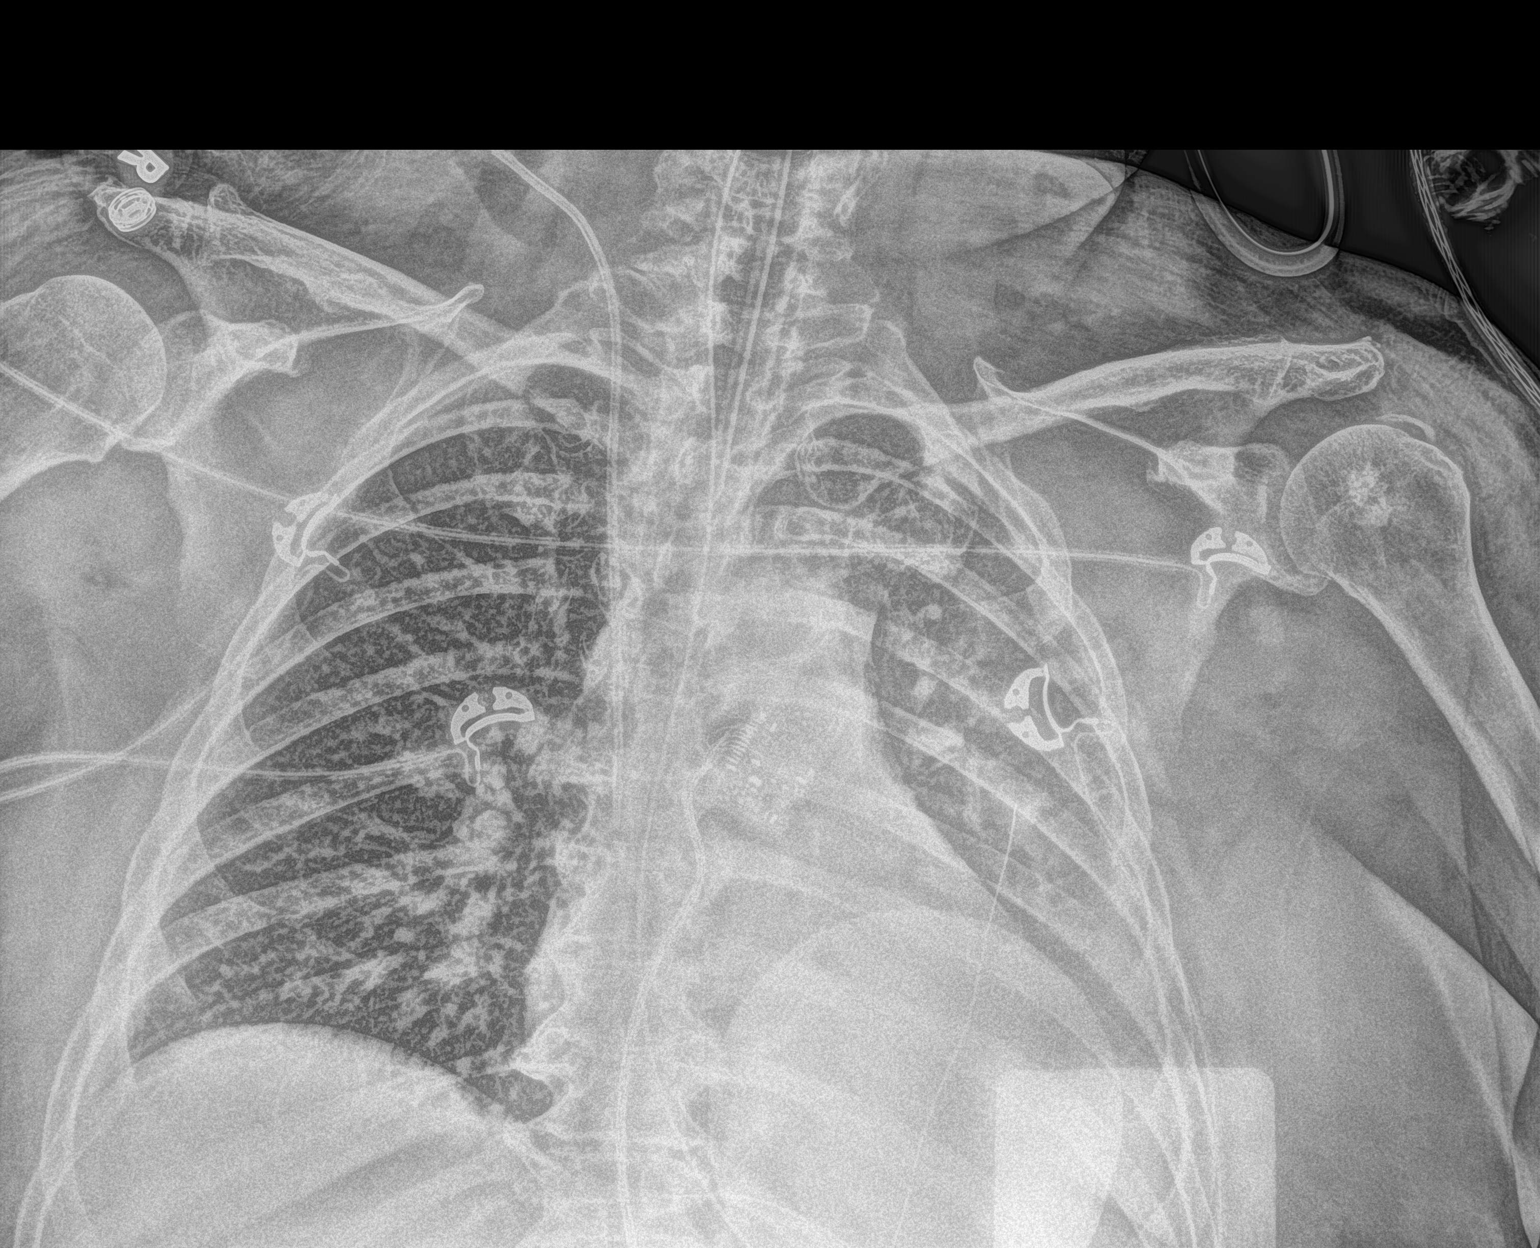

[2 of 2 positions shown; findings below may reference images not displayed]

FINDINGS: None endotracheal tube seen 5.3 cm above the carina. Nasogastric
tube extends into the upper abdomen, beyond the margin of the
examination. Right internal jugular central venous catheter tip
noted within the superior vena cava. There is diffuse opacification
of the left hemithorax which may relate to diffuse airspace
infiltrate within the left lung or, more likely, posteriorly
layering left pleural fluid. The right lung is clear. No
pneumothorax. No pleural effusion on the right. Cardiac size is
within normal limits. The pulmonary vasculature is unremarkable.
IMPRESSION: Support lines and tubes in expected position.  No pneumothorax.

Diffuse opacification of the left hemithorax likely related to
posteriorly layering left pleural fluid. Less likely related to
diffuse left lung airspace infiltrate.

## 2020-05-13 IMAGING — DX DG ABDOMEN 1V
1 series · 1 of 1 positions shown · non-contrast
Comparison: None.

CLINICAL DATA: Orogastric tube placement

EXAM:
ABDOMEN - 1 VIEW

[abdomen supine]
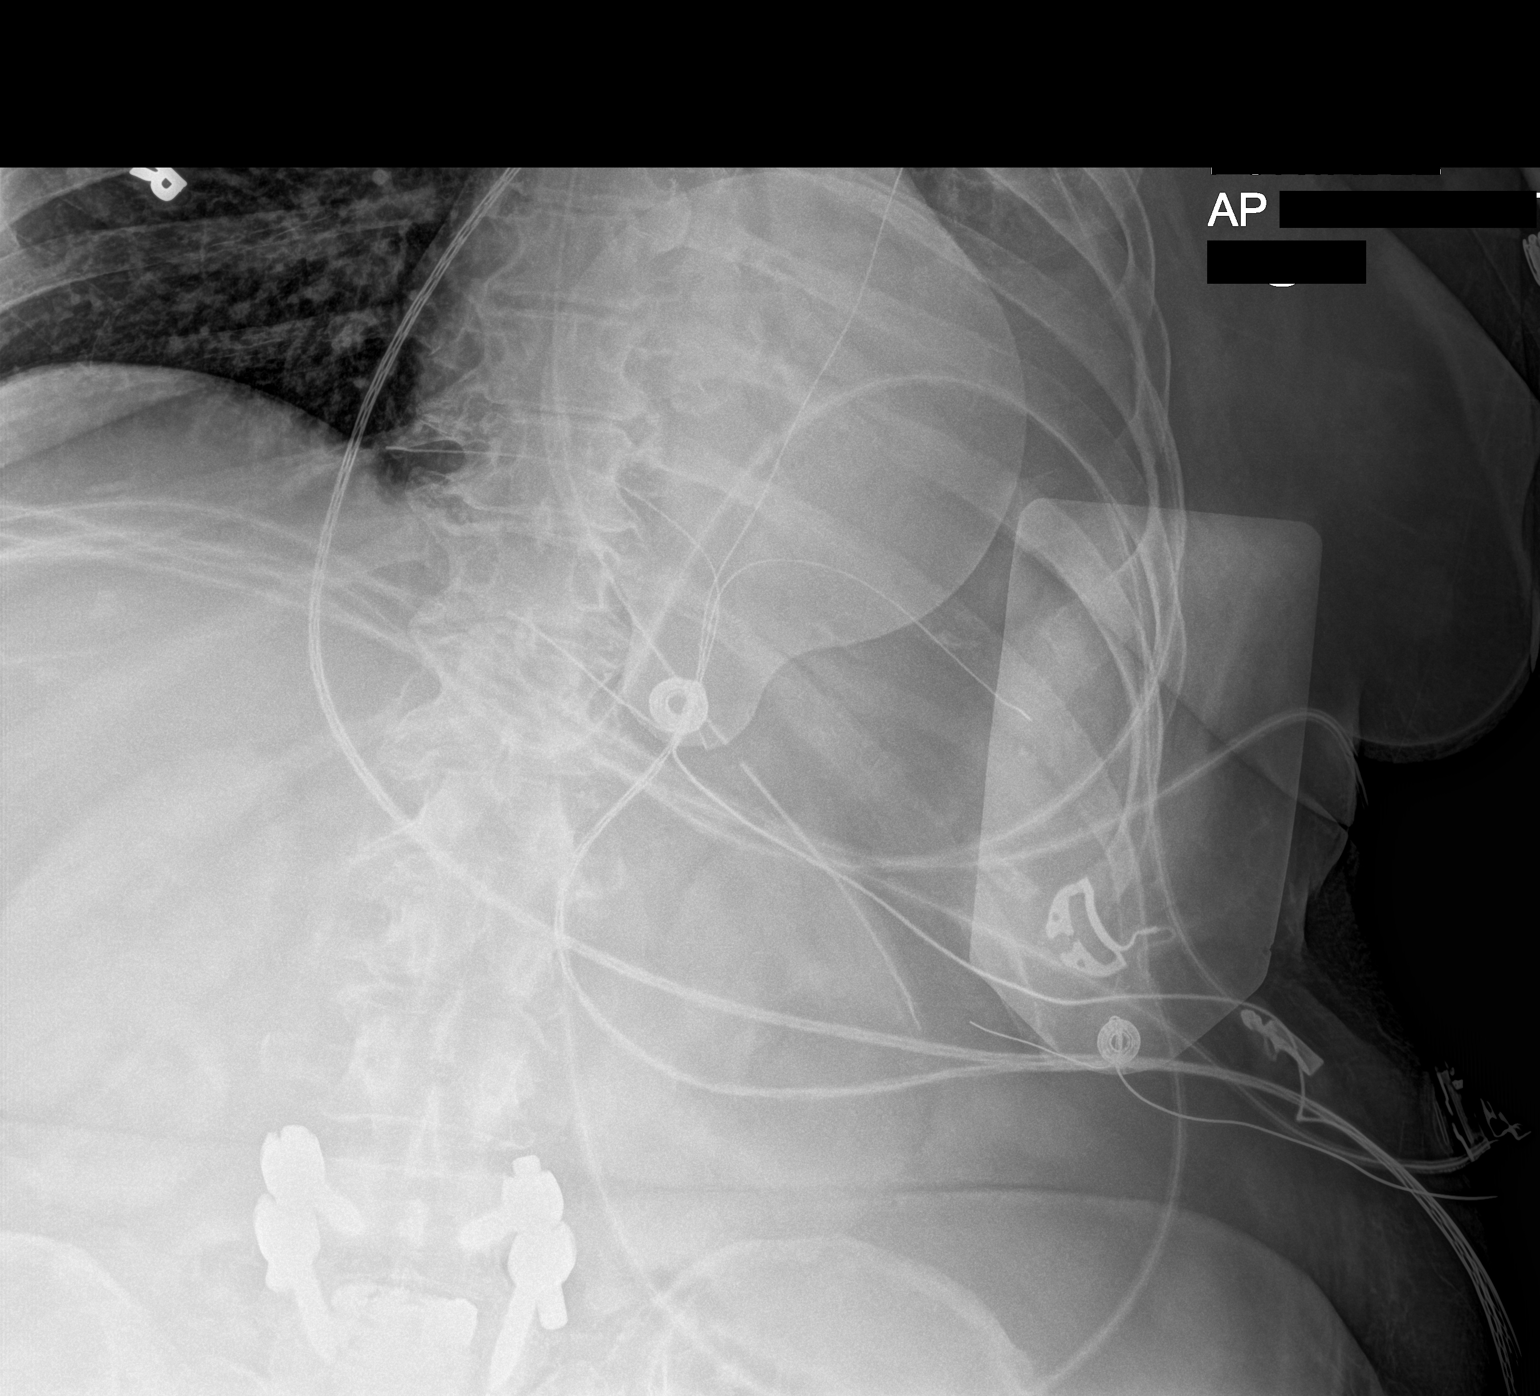

[1 of 1 positions shown; findings below may reference images not displayed]

FINDINGS: Orogastric tube tip is noted within the proximal body of the
stomach. The abdominal gas pattern is indeterminate due to a paucity
of intra-abdominal gas. Right flank and pelvis are excluded from
view. Moderate left pleural effusion noted.
IMPRESSION: Orogastric tube tip within the proximal body of the stomach.

Moderate left pleural effusion.

## 2020-05-13 SURGERY — CORONARY/GRAFT ACUTE MI REVASCULARIZATION
Anesthesia: Moderate Sedation

## 2020-05-13 MED ORDER — HEPARIN SODIUM (PORCINE) 5000 UNIT/ML IJ SOLN
60.0000 [IU]/kg | Freq: Once | INTRAMUSCULAR | Status: AC
  Administered 2020-05-13: 4000 [IU] via INTRAVENOUS

## 2020-05-13 MED ORDER — NOREPINEPHRINE 4 MG/250ML-% IV SOLN
INTRAVENOUS | Status: AC
  Administered 2020-05-13: 10 ug/min via INTRAVENOUS
  Filled 2020-05-13: qty 250

## 2020-05-13 MED ORDER — ATORVASTATIN CALCIUM 20 MG PO TABS: 80.0000 mg | ORAL_TABLET | Freq: Every day | ORAL | Status: DC

## 2020-05-13 MED ORDER — TICAGRELOR 90 MG PO TABS
ORAL_TABLET | ORAL | Status: AC
  Filled 2020-05-13: qty 2

## 2020-05-13 MED ORDER — FAMOTIDINE IN NACL 20-0.9 MG/50ML-% IV SOLN
20.0000 mg | Freq: Two times a day (BID) | INTRAVENOUS | Status: DC
  Administered 2020-05-14: 20 mg via INTRAVENOUS
  Filled 2020-05-13: qty 50

## 2020-05-13 MED ORDER — SODIUM CHLORIDE 0.9 % IV SOLN
100.0000 mg | Freq: Every day | INTRAVENOUS | Status: DC
  Administered 2020-05-14: 100 mg via INTRAVENOUS
  Filled 2020-05-13: qty 100

## 2020-05-13 MED ORDER — HEPARIN (PORCINE) IN NACL 1000-0.9 UT/500ML-% IV SOLN
INTRAVENOUS | Status: DC | PRN
  Administered 2020-05-13 (×2): 500 mL

## 2020-05-13 MED ORDER — TIROFIBAN (AGGRASTAT) BOLUS VIA INFUSION
INTRAVENOUS | Status: DC | PRN
  Administered 2020-05-13: 2425 ug via INTRAVENOUS

## 2020-05-13 MED ORDER — SODIUM BICARBONATE 8.4 % IV SOLN
INTRAVENOUS | Status: AC
  Filled 2020-05-13: qty 50

## 2020-05-13 MED ORDER — ORAL CARE MOUTH RINSE
15.0000 mL | OROMUCOSAL | Status: DC
  Administered 2020-05-14 (×6): 15 mL via OROMUCOSAL

## 2020-05-13 MED ORDER — SODIUM CHLORIDE 0.9 % IV SOLN
200.0000 mg | Freq: Once | INTRAVENOUS | Status: AC
  Administered 2020-05-14: 200 mg via INTRAVENOUS
  Filled 2020-05-13: qty 40

## 2020-05-13 MED ORDER — ONDANSETRON HCL 4 MG/2ML IJ SOLN: 4.0000 mg | Freq: Four times a day (QID) | INTRAMUSCULAR | Status: DC | PRN

## 2020-05-13 MED ORDER — NOREPINEPHRINE BITARTRATE 1 MG/ML IV SOLN
INTRAVENOUS | Status: AC
  Filled 2020-05-13: qty 4

## 2020-05-13 MED ORDER — HEPARIN (PORCINE) 25000 UT/250ML-% IV SOLN: 950.0000 [IU]/h | INTRAVENOUS | Status: DC

## 2020-05-13 MED ORDER — NITROGLYCERIN 1 MG/10 ML FOR IR/CATH LAB
INTRA_ARTERIAL | Status: DC | PRN
  Administered 2020-05-13: 100 ug

## 2020-05-13 MED ORDER — HEPARIN SODIUM (PORCINE) 5000 UNIT/ML IJ SOLN
5000.0000 [IU] | Freq: Three times a day (TID) | INTRAMUSCULAR | Status: DC
  Administered 2020-05-14: 5000 [IU] via SUBCUTANEOUS

## 2020-05-13 MED ORDER — EPINEPHRINE 1 MG/10ML IJ SOSY
PREFILLED_SYRINGE | INTRAMUSCULAR | Status: DC | PRN
  Administered 2020-05-13: 1 mg via INTRAVENOUS

## 2020-05-13 MED ORDER — LIDOCAINE HCL (PF) 1 % IJ SOLN: INTRAMUSCULAR | Status: DC | PRN

## 2020-05-13 MED ORDER — HEPARIN SODIUM (PORCINE) 5000 UNIT/ML IJ SOLN: 60.0000 [IU]/kg | Freq: Once | INTRAMUSCULAR | Status: DC

## 2020-05-13 MED ORDER — PROPOFOL 1000 MG/100ML IV EMUL: 5.0000 ug/kg/min | INTRAVENOUS | Status: DC

## 2020-05-13 MED ORDER — TIROFIBAN HCL IN NACL 5-0.9 MG/100ML-% IV SOLN
INTRAVENOUS | Status: AC | PRN
  Administered 2020-05-13: 0.15 ug/kg/min via INTRAVENOUS

## 2020-05-13 MED ORDER — ROCURONIUM BROMIDE 50 MG/5ML IV SOLN
INTRAVENOUS | Status: DC | PRN
  Administered 2020-05-13: 100 mg via INTRAVENOUS

## 2020-05-13 MED ORDER — CHLORHEXIDINE GLUCONATE 0.12% ORAL RINSE (MEDLINE KIT)
15.0000 mL | Freq: Two times a day (BID) | OROMUCOSAL | Status: DC
  Administered 2020-05-14 (×2): 15 mL via OROMUCOSAL

## 2020-05-13 MED ORDER — HEPARIN (PORCINE) IN NACL 1000-0.9 UT/500ML-% IV SOLN
INTRAVENOUS | Status: AC
  Filled 2020-05-13: qty 1000

## 2020-05-13 MED ORDER — PROPOFOL 1000 MG/100ML IV EMUL
INTRAVENOUS | Status: AC
  Administered 2020-05-13: 10 ug/kg/min via INTRAVENOUS
  Filled 2020-05-13: qty 100

## 2020-05-13 MED ORDER — BIVALIRUDIN TRIFLUOROACETATE 250 MG IV SOLR
INTRAVENOUS | Status: AC
  Filled 2020-05-13: qty 250

## 2020-05-13 MED ORDER — MIDAZOLAM HCL 2 MG/2ML IJ SOLN
INTRAMUSCULAR | Status: AC
  Filled 2020-05-13: qty 2

## 2020-05-13 MED ORDER — FENTANYL CITRATE (PF) 100 MCG/2ML IJ SOLN
INTRAMUSCULAR | Status: AC
  Filled 2020-05-13: qty 2

## 2020-05-13 MED ORDER — MIDAZOLAM HCL 2 MG/2ML IJ SOLN: 2.0000 mg | INTRAMUSCULAR | Status: DC | PRN

## 2020-05-13 MED ORDER — VERAPAMIL HCL 2.5 MG/ML IV SOLN
INTRAVENOUS | Status: AC
  Filled 2020-05-13: qty 2

## 2020-05-13 MED ORDER — MIDAZOLAM HCL 2 MG/2ML IJ SOLN
2.0000 mg | INTRAMUSCULAR | Status: DC | PRN
  Administered 2020-05-14: 2 mg via INTRAVENOUS
  Filled 2020-05-13: qty 2

## 2020-05-13 MED ORDER — ASPIRIN 81 MG PO CHEW: 324.0000 mg | CHEWABLE_TABLET | Freq: Once | ORAL | Status: DC

## 2020-05-13 MED ORDER — TICAGRELOR 90 MG PO TABS: 90.0000 mg | ORAL_TABLET | Freq: Two times a day (BID) | ORAL | Status: DC

## 2020-05-13 MED ORDER — NOREPINEPHRINE 4 MG/250ML-% IV SOLN: 0.0000 ug/min | INTRAVENOUS | Status: DC

## 2020-05-13 MED ORDER — SODIUM CHLORIDE 0.9% FLUSH: 3.0000 mL | INTRAVENOUS | Status: DC | PRN

## 2020-05-13 MED ORDER — IOHEXOL 300 MG/ML  SOLN
INTRAMUSCULAR | Status: DC | PRN
  Administered 2020-05-13: 210 mL

## 2020-05-13 MED ORDER — SODIUM CHLORIDE 0.9% FLUSH
3.0000 mL | Freq: Two times a day (BID) | INTRAVENOUS | Status: DC
  Administered 2020-05-14: 3 mL via INTRAVENOUS

## 2020-05-13 MED ORDER — FENTANYL BOLUS VIA INFUSION
50.0000 ug | INTRAVENOUS | Status: DC | PRN
  Filled 2020-05-13: qty 100

## 2020-05-13 MED ORDER — TIROFIBAN HCL IN NACL 5-0.9 MG/100ML-% IV SOLN
INTRAVENOUS | Status: AC
  Filled 2020-05-13: qty 100

## 2020-05-13 MED ORDER — TICAGRELOR 90 MG PO TABS
ORAL_TABLET | ORAL | Status: DC | PRN
  Administered 2020-05-13: 180 mg via ORAL

## 2020-05-13 MED ORDER — FENTANYL 2500MCG IN NS 250ML (10MCG/ML) PREMIX INFUSION
50.0000 ug/h | INTRAVENOUS | Status: DC
  Administered 2020-05-14: 50 ug/h via INTRAVENOUS
  Filled 2020-05-13: qty 250

## 2020-05-13 MED ORDER — HYDROCORTISONE NA SUCCINATE PF 100 MG IJ SOLR: 50.0000 mg | Freq: Four times a day (QID) | INTRAMUSCULAR | Status: DC

## 2020-05-13 MED ORDER — SODIUM CHLORIDE 0.9 % IV SOLN: INTRAVENOUS | Status: DC

## 2020-05-13 MED ORDER — HEPARIN SODIUM (PORCINE) 1000 UNIT/ML IJ SOLN
INTRAMUSCULAR | Status: DC | PRN
Start: 2020-05-13 — End: 2020-05-13
  Administered 2020-05-13: 7000 [IU] via INTRAVENOUS

## 2020-05-13 MED ORDER — FUROSEMIDE 10 MG/ML IJ SOLN
INTRAMUSCULAR | Status: AC
  Filled 2020-05-13: qty 4

## 2020-05-13 MED ORDER — ACETAMINOPHEN 325 MG PO TABS: 650.0000 mg | ORAL_TABLET | ORAL | Status: DC | PRN

## 2020-05-13 MED ORDER — ETOMIDATE 2 MG/ML IV SOLN
INTRAVENOUS | Status: DC | PRN
  Administered 2020-05-13: 15 mg via INTRAVENOUS

## 2020-05-13 MED ORDER — FENTANYL CITRATE (PF) 100 MCG/2ML IJ SOLN: 50.0000 ug | Freq: Once | INTRAMUSCULAR | Status: DC

## 2020-05-13 MED ORDER — SODIUM CHLORIDE 0.9 % IV BOLUS
1000.0000 mL | Freq: Once | INTRAVENOUS | Status: AC
  Administered 2020-05-13: 1000 mL via INTRAVENOUS

## 2020-05-13 MED ORDER — SODIUM CHLORIDE 0.9 % IV SOLN: 250.0000 mL | INTRAVENOUS | Status: DC | PRN

## 2020-05-13 MED ORDER — ASPIRIN 81 MG PO CHEW: 81.0000 mg | CHEWABLE_TABLET | Freq: Every day | ORAL | Status: DC

## 2020-05-13 MED ORDER — DOCUSATE SODIUM 50 MG/5ML PO LIQD: 100.0000 mg | Freq: Two times a day (BID) | ORAL | Status: DC

## 2020-05-13 MED ORDER — SODIUM BICARBONATE 8.4 % IV SOLN
INTRAVENOUS | Status: DC | PRN
  Administered 2020-05-13: 100 meq via INTRAVENOUS

## 2020-05-13 MED ORDER — LIDOCAINE HCL (PF) 1 % IJ SOLN
INTRAMUSCULAR | Status: DC | PRN
  Administered 2020-05-13: 15 mL

## 2020-05-13 MED ORDER — TIROFIBAN HCL IV 12.5 MG/250 ML
0.0750 ug/kg/min | INTRAVENOUS | Status: DC
  Administered 2020-05-14: 0.075 ug/kg/min via INTRAVENOUS
  Filled 2020-05-13 (×3): qty 250

## 2020-05-13 MED ORDER — FUROSEMIDE 10 MG/ML IJ SOLN
INTRAMUSCULAR | Status: DC | PRN
  Administered 2020-05-13: 40 mg via INTRAVENOUS

## 2020-05-13 MED ORDER — HEPARIN SODIUM (PORCINE) 1000 UNIT/ML IJ SOLN
INTRAMUSCULAR | Status: AC
  Filled 2020-05-13: qty 1

## 2020-05-13 MED ORDER — POLYETHYLENE GLYCOL 3350 17 G PO PACK: 17.0000 g | PACK | Freq: Every day | ORAL | Status: DC

## 2020-05-13 MED ORDER — CHLORHEXIDINE GLUCONATE CLOTH 2 % EX PADS
6.0000 | MEDICATED_PAD | Freq: Every day | CUTANEOUS | Status: DC
  Administered 2020-05-14: 6 via TOPICAL

## 2020-05-13 SURGICAL SUPPLY — 28 items
BALLN EUPHORA RX 2.0X15 (BALLOONS) ×2
BALLN ~~LOC~~ TREK RX 2.75X15 (BALLOONS) ×2
BALLOON EUPHORA RX 2.0X15 (BALLOONS) ×1 IMPLANT
BALLOON ~~LOC~~ TREK RX 2.75X15 (BALLOONS) ×1 IMPLANT
CANNULA 5F STIFF (CANNULA) ×2 IMPLANT
CATH INFINITI 5FR ANG PIGTAIL (CATHETERS) ×2 IMPLANT
CATH INFINITI 5FR JL4 (CATHETERS) ×2 IMPLANT
CATH INFINITI JR4 5F (CATHETERS) ×2 IMPLANT
CATH LAUNCHER 6FR JR4 (CATHETERS) ×2 IMPLANT
CATH SWAN GANZ 7F STRAIGHT (CATHETERS) ×2 IMPLANT
CATH VISTA GUIDE 6FR XBLAD3.5 (CATHETERS) ×2 IMPLANT
DEVICE CLOSURE MYNXGRIP 6/7F (Vascular Products) ×4 IMPLANT
DRAPE BRACHIAL (DRAPES) IMPLANT
GLIDESHEATH SLEND SS 6F .021 (SHEATH) IMPLANT
GUIDEWIRE INQWIRE 1.5J.035X260 (WIRE) IMPLANT
INQWIRE 1.5J .035X260CM (WIRE)
KIT ENCORE 26 ADVANTAGE (KITS) ×2 IMPLANT
KIT MANI 3VAL PERCEP (MISCELLANEOUS) ×2 IMPLANT
PACK CARDIAC CATH (CUSTOM PROCEDURE TRAY) ×2 IMPLANT
PANNUS RETENTION SYSTEM 2 PAD (MISCELLANEOUS) ×2 IMPLANT
PROTECTION STATION PRESSURIZED (MISCELLANEOUS) ×2
SHEATH AVANTI 6FR X 11CM (SHEATH) ×2 IMPLANT
SHEATH AVANTI 7FRX11 (SHEATH) ×2 IMPLANT
STATION PROTECTION PRESSURIZED (MISCELLANEOUS) ×1 IMPLANT
STENT RESOLUTE ONYX 2.5X34 (Permanent Stent) ×2 IMPLANT
WIRE GUIDERIGHT .035X150 (WIRE) ×2 IMPLANT
WIRE RUNTHROUGH .014X180CM (WIRE) ×2 IMPLANT
WIRE RUNTHROUGH EXTENSION (WIRE) IMPLANT

## 2020-05-13 NOTE — Progress Notes (Signed)
GOALS OF CARE DISCUSSION  The Clinical status was relayed to family in detail. Daughter and HUsband Updated and notified of patients medical condition.    Patient remains unresponsive and will not open eyes to command.   Patient is having a weak cough and struggling to remove secretions.   Patient with increased WOB and using accessory muscles to breathe Explained to family course of therapy and the modalities    Patient with Progressive multiorgan failure with a very high probablity of a very minimal chance of meaningful recovery despite all aggressive and optimal medical therapy.  PATIENT REMAINS FULL CODE  Family understands the situation.  Family are satisfied with Plan of action and management. All questions answered  Additional CC time 35 mins   Courtney Henderson Santiago Glad, M.D.  [facility]  Medical Director [facility] Medical Director [facility] Cardio-Pulmonary Department

## 2020-05-13 NOTE — H&P (Addendum)
NAME:  Courtney Henderson, MRN:  [id number], DOB:  01/29/1960, LOS: 0 ADMISSION DATE:  05/01/2020,  REFERRING MD: ARIDA CHIEF COMPLAINT:  Acute STEMI  History of Present Illness:   60 year old female with no previous cardiac history.   She has been having dry cough for about 5 to 6 days.   She did not seek medical attention.   4/25  she had acute onset of shortness of breath and called EMS.  No chest pain.   The patient was in respiratory distress.  An EKG was done which showed evidence of anterolateral ST elevation as well as inferior ST elevation.    The patient was brought to the ED.   +severe resp distress +severe orthopnea.  She was mildly hypotensive and significantly hypoxic.   Patient was emergently intubated by ER doc became severely hypotensive and spite of norepinephrine drip.   She became tachycardic and went into ventricular tachycardia.   she was shocked once to sinus tachycardia.   The patient was given aspirin and unfractionated heparin CODE STEMI CALLED AND PATIENT WENT TO CATH LAB   PERTINENT HISTORY 60 yo white female admitted to ICU for acute STEMI and acute COVID  Patient with chest pain all day while at home presented with acute and severe hypoxic respiratory failure  EKG c/w STEMI Patient failed CPAP Patient was emergently intubated and CODE STEMI called +LAD STEMI COVID 19 pneumonia/infection H/o DM +acute renal failure    Significant Hospital Events: Including procedures, antibiotic start and stop dates in addition to other pertinent events   . 4/25 acute STEMI . 4/25 acute cardiac cath 100% LAD with RCA clot . 4/25 severe resp failure/hypoxia intubated on vent, pressors    Objective   Blood pressure 108/72, pulse (!) 144, resp. rate 20, weight 97 kg, SpO2 93 %.    Vent Mode: AC FiO2 (%):  [100 %] 100 % Set Rate:  [20 bmp-26 bmp] 26 bmp Vt Set:  [450 mL] 450 mL PEEP:  [5 cmH20] 5 cmH20  No intake or output data in the 24 hours ending 05/06/2020  2201 Filed Weights   05/12/2020 1937  Weight: 97 kg    REVIEW OF SYSTEMS  PATIENT IS UNABLE TO PROVIDE COMPLETE REVIEW OF SYSTEMS DUE TO SEVERE CRITICAL ILLNESS AND TOXIC METABOLIC ENCEPHALOPATHY   PHYSICAL EXAMINATION:  GENERAL:critically ill appearing, +resp distress HEAD: Normocephalic, atraumatic.  EYES: Pupils equal, round, reactive to light.  No scleral icterus.  NECK: Supple.  PULMONARY: +rhonchi, +wheezing CARDIOVASCULAR: S1 and S2. Regular rate and rhythm. No murmurs, rubs, or gallops.  GASTROINTESTINAL: Soft, nontender, -distended. Positive bowel sounds.  MUSCULOSKELETAL: No swelling, clubbing, or edema.  NEUROLOGIC: obtunded SKIN:intact,warm,dry    Labs/imaging that I havepersonally reviewed  (right click and "Reselect all SmartList Selections" daily)      Assessment & Plan:  60 yo obese white female with acute and severe acute coronary syndrome with acute STEMI with severe hypoxic respiratory failure with cardiac RCA clots with COVID 19 infection/pneumonia with acute renal failure   Acute hypoxemic respiratory failure due to COVID-19 pneumonia/ARDS Mechanical ventilation via ARDS protocol, target PRVC 6 cc/kg Wean PEEP and FiO2 as able Goal plateau pressure less than 30, driving pressure less than 15 Paralytics if necessary for vent synchrony, gas exchange Cycle prone positioning if necessary for oxygenation Deep sedation per PAD protocol diuresis as tolerated based on Kidney function VAP prevention order set Follow inflammatory markers as needed with CRP Vitamin C, zinc as indicated Plan to repeat  and check resp cultures if needed Continue IV steroids  Will need to ask family about remdesivir Aggressive pulm toilet recommended Assess  proning as tolerated due to severe hypoxia   Maintain airborne and contact precautions  Vitamin C and zinc High risk cardiac arrest and  death  Severe ACUTE Hypoxic and Hypercapnic Respiratory Failure -continue  Mechanical Ventilator support -continue Bronchodilator Therapy -Wean Fio2 and PEEP as tolerated -VAP/VENT bundle implementation   ACUTE SYSTOLIC CARDIAC FAILURE- ECHO pending -oxygen as needed -Lasix as tolerated -follow up cardiac enzymes as indicated -follow up cardiology recs  CARDIOGENIC SHOCK -use vasopressors to keep MAP>65 -follow ABG and LA -follow up cultures -emperic ABX   ACUTE KIDNEY INJURY/Renal Failure -continue Foley Catheter-assess need -Avoid nephrotoxic agents -Follow urine output, BMP -Ensure adequate renal perfusion, optimize oxygenation -Renal dose medications   ELECTROLYTES -follow labs as needed -replace as needed -pharmacy consultation and following  ACUTE ANEMIA- TRANSFUSE AS NEEDED CONSIDER TRANSFUSION  IF HGB<7 DVT PRX with TED/SCD's ONLY    NEUROLOGY ACUTE TOXIC METABOLIC ENCEPHALOPATHY -need for sedation -Goal RASS -2 to -3  Plan for CVL and art line placed  Best practice (right click and "Reselect all SmartList Selections" daily)  Diet:  NPO Pain/Anxiety/Delirium protocol (if indicated): Yes (RASS goal -2) VAP protocol (if indicated): Yes DVT prophylaxis: Systemic AC GI prophylaxis: H2B Glucose control:  SSI Yes Central venous access:  Yes, and it is still needed Arterial line:  Yes, and it is still needed Foley:  N/A Mobility:  bed rest  PT consulted: N/A Code Status:  full code Disposition: ICU  Labs   CBC: Recent Labs  Lab 04/25/2020 1932  WBC 24.0*  NEUTROABS 20.1*  HGB 16.2*  HCT 48.4*  MCV 100.2*  PLT 503    Basic Metabolic Panel: Recent Labs  Lab 05/07/2020 1932  NA 139  K 3.6  CL 105  CO2 17*  GLUCOSE 193*  BUN 15  CREATININE 1.54*  CALCIUM 8.9   GFR: Estimated Creatinine Clearance: 40.9 mL/min (A) (by C-G formula based on SCr of 1.54 mg/dL (H)). Recent Labs  Lab 05/15/2020 1932  WBC 24.0*    Liver Function Tests: Recent Labs  Lab 05/17/2020 1932  AST 48*  ALT 23  ALKPHOS 64  BILITOT  0.9  PROT 6.4*  ALBUMIN 3.4*   No results for input(s): LIPASE, AMYLASE in the last 168 hours. No results for input(s): AMMONIA in the last 168 hours.  ABG    Component Value Date/Time   PHART 7.06 (LL) 05/07/2020 2029   PCO2ART 58 (H) 05/16/2020 2029   PO2ART 107 05/03/2020 2029   HCO3 16.4 (L) 05/05/2020 2029   ACIDBASEDEF 14.4 (H) 05/03/2020 2029   O2SAT 95.3 05/03/2020 2029     Coagulation Profile: No results for input(s): INR, PROTIME in the last 168 hours.  Cardiac Enzymes: No results for input(s): CKTOTAL, CKMB, CKMBINDEX, TROPONINI in the last 168 hours.  HbA1C: Hgb A1c MFr Bld  Date/Time Value Ref Range Status  12/12/2019 11:44 AM 4.9 <5.7 % of total Hgb Final    Comment:    For the purpose of screening for the presence of diabetes: . <5.7%       Consistent with the absence of diabetes 5.7-6.4%    Consistent with increased risk for diabetes             (prediabetes) > or =6.5%  Consistent with diabetes . This assay result is consistent with a decreased risk of diabetes. . Currently, no consensus  exists regarding use of hemoglobin A1c for diagnosis of diabetes in children. . According to American Diabetes Association (ADA) guidelines, hemoglobin A1c <7.0% represents optimal control in non-pregnant diabetic patients. Different metrics may apply to specific patient populations.  Standards of Medical Care in Diabetes(ADA). Marland Kitchen   03/16/2019 10:05 AM 6.7 (H) 4.8 - 5.6 % Final    Comment:    (NOTE) Pre diabetes:          5.7%-6.4% Diabetes:              >6.4% Glycemic control for   <7.0% adults with diabetes     CBG: No results for input(s): GLUCAP in the last 168 hours.   Past Medical History:  She,  has a past medical history of Complication of anesthesia, Diabetes mellitus without complication ([place]), Lymphedema, and Neuromuscular disorder ([place]).   Surgical History:   Past Surgical History:  Procedure Laterality Date  . CATARACT EXTRACTION,  BILATERAL    . JOINT REPLACEMENT Left    hip  . KYPHOPLASTY N/A 01/05/2019   Procedure: KYPHOPLASTY L5;  Surgeon: Hessie Knows, MD;  Location: [facility] ORS;  Service: Orthopedics;  Laterality: N/A;  . MULTIPLE TOOTH EXTRACTIONS  06/2018   2-3 weeks ago; 10 teeth  . TUBAL LIGATION       Social History:   reports that she has been smoking cigarettes. She has a 60.00 pack-year smoking history. She has never used smokeless tobacco. She reports previous alcohol use. She reports that she does not use drugs.   Family History:  Her family history includes Heart disease in her maternal grandfather.   Allergies Allergies  Allergen Reactions  . Amoxicillin Anaphylaxis  . Doxycycline Shortness Of Breath, Swelling and Other (See Comments)    Chest pain  . Morphine And Related Swelling  . Penicillins Swelling    Did it involve swelling of the face/tongue/throat, SOB, or low BP? Unknown Did it involve sudden or severe rash/hives, skin peeling, or any reaction on the inside of your mouth or nose? Unknown Did you need to seek medical attention at a hospital or doctor's office? Unknown When did it last happen?unkn  If all above answers are "NO", may proceed with cephalosporin use.      Home Medications  Prior to Admission medications   Medication Sig Start Date End Date Taking? Authorizing Provider  Blood Glucose Monitoring Suppl (CONTOUR NEXT EZ MONITOR) w/Device KIT 1 Device by Does not apply route in the morning and at bedtime.  04/29/16   [provider]  BREO ELLIPTA 100-25 MCG/INH AEPB Inhale 1 puff into the lungs daily. 01/23/20   Cletis Athens, MD  gabapentin (NEURONTIN) 600 MG tablet Take 1 tablet (600 mg total) by mouth 3 (three) times daily. 04/15/20   Cletis Athens, MD  glucose blood (ACCU-CHEK GUIDE) test strip Use as instructed 02/12/20   Cletis Athens, MD  ibuprofen (ADVIL) 200 MG tablet Take 200 mg by mouth every 6 (six) hours as needed.    [provider]   latanoprost (XALATAN) 0.005 % ophthalmic solution Place 1 drop into both eyes at bedtime. 03/18/18   [provider]  metFORMIN (GLUCOPHAGE) 500 MG tablet Take 1 tablet (500 mg total) by mouth 2 (two) times daily. 12/28/19   Cletis Athens, MD  MICROLET LANCETS MISC 1 Stick by Other route in the morning and at bedtime.  04/29/16   [provider]       DVT/GI PRX  assessed I Assessed the need for Labs I  Assessed the need for Foley I Assessed the need for Central Venous Line Family Discussion when available I Assessed the need for Mobilization I made an Assessment of medications to be adjusted accordingly Safety Risk assessment completed    Critical Care Time devoted to patient care services described in this note is 75 minutes.  Critical care was necessary to treat /prevent imminent and life-threatening deterioration. Overall, patient is critically ill, prognosis is guarded.  Patient with Multiorgan failure and at high risk for cardiac arrest and death.    Corrin Parker, M.D.  [facility]  Medical Director [place] Director [facility] Cardio-Pulmonary Department

## 2020-05-13 NOTE — Code Documentation (Addendum)
Patient in v fib (HR 190s), Dr. Penne Lash defibrillated patient with 200 J, HR now 160s

## 2020-05-13 NOTE — Progress Notes (Signed)
Remdesivir - Pharmacy Brief Note   O:  ALT: 23  CXR:  SpO2: 97 % on ventilator   A/P:  Remdesivir 200 mg IVPB once followed by 100 mg IVPB daily x 4 days.   Sareena Odeh D 04/30/2020 11:16 PM

## 2020-05-13 NOTE — ED Provider Notes (Signed)
[facility] Emergency Department Provider Note  ____________________________________________   Event Date/Time   First MD Initiated Contact with Patient 05/07/2020 1951     (approximate)  I have reviewed the triage vital signs and the nursing notes.   HISTORY  Chief Complaint Code STEMI    HPI Courtney Henderson is a 60 y.o. female here with shortness of breath.  History is severely limited due to patient's severe illness.  Per report, patient has been increasingly fatigued and short of breath throughout the last 24 hours.  EMS was called and on EMS arrival, patient had a severe inferior STEMI.  She was activated as a code STEMI.  She was hypoxic to the 80s.  She has since had worsening hypotension and increased work of breathing.  On my assessment, patient states she has aching, substernal chest pressure.  She feels extremely short of breath.  Remainder of history limited as patient is on BiPAP and in extremis.        Past Medical History:  Diagnosis Date  . Complication of anesthesia    long time to wake up, headache  . Diabetes mellitus without complication ([place])   . Lymphedema    in legs/feet  . Neuromuscular disorder [facility])     Patient Active Problem List   Diagnosis Date Noted  . Acute ST elevation myocardial infarction (STEMI) of anterolateral wall ([place]) 05/04/2020  . Type 2 diabetes mellitus without complication ([place]) 45/03/8880  . Annual physical exam 12/18/2019  . Hyperlipidemia 12/18/2019  . Skin tear of left hand without complication 80/03/4915  . Cigar smoker 12/18/2019  . Spinal stenosis of lumbar region with neurogenic claudication 03/22/2019  . Lumbar facet arthropathy 03/17/2018  . Lumbar spondylosis 03/17/2018  . Bilateral lower extremity edema 03/09/2018  . Lumbar radiculopathy 11/09/2017  . Chronic pain syndrome 11/09/2017  . Lumbar foraminal stenosis 11/09/2017  . Lumbosacral spondylosis with radiculopathy 07/13/2017  .  Polyarthralgia 07/13/2017  . Diabetic polyneuropathy associated with type 2 diabetes mellitus ([place]) 07/13/2017    Past Surgical History:  Procedure Laterality Date  . CATARACT EXTRACTION, BILATERAL    . JOINT REPLACEMENT Left    hip  . KYPHOPLASTY N/A 01/05/2019   Procedure: KYPHOPLASTY L5;  Surgeon: Hessie Knows, MD;  Location: [facility] ORS;  Service: Orthopedics;  Laterality: N/A;  . MULTIPLE TOOTH EXTRACTIONS  06/2018   2-3 weeks ago; 10 teeth  . TUBAL LIGATION      Prior to Admission medications   Medication Sig Start Date End Date Taking? Authorizing Provider  Blood Glucose Monitoring Suppl (CONTOUR NEXT EZ MONITOR) w/Device KIT 1 Device by Does not apply route in the morning and at bedtime.  04/29/16   [provider]  BREO ELLIPTA 100-25 MCG/INH AEPB Inhale 1 puff into the lungs daily. 01/23/20   Cletis Athens, MD  gabapentin (NEURONTIN) 600 MG tablet Take 1 tablet (600 mg total) by mouth 3 (three) times daily. 04/15/20   Cletis Athens, MD  glucose blood (ACCU-CHEK GUIDE) test strip Use as instructed 02/12/20   Cletis Athens, MD  ibuprofen (ADVIL) 200 MG tablet Take 200 mg by mouth every 6 (six) hours as needed.    [provider]  latanoprost (XALATAN) 0.005 % ophthalmic solution Place 1 drop into both eyes at bedtime. 03/18/18   [provider]  metFORMIN (GLUCOPHAGE) 500 MG tablet Take 1 tablet (500 mg total) by mouth 2 (two) times daily. 12/28/19   Cletis Athens, MD  MICROLET LANCETS MISC 1 Stick by  Other route in the morning and at bedtime.  04/29/16   [provider]    Allergies Amoxicillin, Doxycycline, Morphine and related, and Penicillins  Family History  Problem Relation Age of Onset  . Heart disease Maternal Grandfather     Social History Social History   Tobacco Use  . Smoking status: Current Every Day Smoker    Packs/day: 1.50    Years: 40.00    Pack years: 60.00    Types: Cigarettes  . Smokeless tobacco: Never Used  Vaping  Use  . Vaping Use: Never used  Substance Use Topics  . Alcohol use: Not Currently    Alcohol/week: 0.0 standard drinks  . Drug use: Never    Review of Systems  Review of Systems  Unable to perform ROS: Acuity of condition     ____________________________________________  PHYSICAL EXAM:      VITAL SIGNS: ED Triage Vitals  Enc Vitals Group     BP 05/18/2020 1924 (!) 65/50     Pulse Rate 05/15/2020 1924 (!) 182     Resp 05/06/2020 1930 (!) 42     Temp --      Temp src --      SpO2 05/05/2020 1924 100 %     Weight 04/23/2020 1937 213 lb 13.5 oz (97 kg)     Height --      Head Circumference --      Peak Flow --      Pain Score --      Pain Loc --      Pain Edu? --      Excl. in [place]? --      Physical Exam Vitals and nursing note reviewed.  Constitutional:      General: She is in acute distress.     Appearance: She is well-developed. She is ill-appearing and diaphoretic.  HENT:     Head: Normocephalic and atraumatic.  Eyes:     Conjunctiva/sclera: Conjunctivae normal.  Cardiovascular:     Rate and Rhythm: Regular rhythm. Tachycardia present.     Heart sounds: Normal heart sounds. No murmur heard. No friction rub.  Pulmonary:     Effort: Pulmonary effort is normal. Tachypnea present. No respiratory distress.     Breath sounds: Decreased air movement present. Examination of the right-upper field reveals rales. Examination of the left-upper field reveals rales. Examination of the right-middle field reveals rales. Examination of the left-middle field reveals rales. Examination of the right-lower field reveals rales. Examination of the left-lower field reveals rales. Rales present. No wheezing.  Abdominal:     General: There is no distension.     Palpations: Abdomen is soft.     Tenderness: There is no abdominal tenderness.  Musculoskeletal:     Cervical back: Neck supple.  Skin:    General: Skin is warm.     Capillary Refill: Capillary refill takes less than 2 seconds.   Neurological:     Mental Status: She is alert and oriented to person, place, and time.     Motor: No abnormal muscle tone.       ____________________________________________   LABS (all labs ordered are listed, but only abnormal results are displayed)  Labs Reviewed  RESP PANEL BY RT-PCR (FLU A&B, COVID) ARPGX2 - Abnormal; Notable for the following components:      Result Value   SARS Coronavirus 2 by RT PCR POSITIVE (*)    All other components within normal limits  CBC WITH DIFFERENTIAL/PLATELET - Abnormal; Notable for  the following components:   WBC 24.0 (*)    Hemoglobin 16.2 (*)    HCT 48.4 (*)    MCV 100.2 (*)    RDW 16.3 (*)    nRBC 0.5 (*)    Neutro Abs 20.1 (*)    Basophils Absolute 0.2 (*)    Abs Immature Granulocytes 0.52 (*)    All other components within normal limits  COMPREHENSIVE METABOLIC PANEL - Abnormal; Notable for the following components:   CO2 17 (*)    Glucose, Bld 193 (*)    Creatinine, Ser 1.54 (*)    Total Protein 6.4 (*)    Albumin 3.4 (*)    AST 48 (*)    GFR, Estimated 38 (*)    Anion gap 17 (*)    All other components within normal limits  PROTIME-INR - Abnormal; Notable for the following components:   Prothrombin Time 16.2 (*)    INR 1.3 (*)    All other components within normal limits  APTT - Abnormal; Notable for the following components:   aPTT 63 (*)    All other components within normal limits  BLOOD GAS, ARTERIAL - Abnormal; Notable for the following components:   pH, Arterial 7.06 (*)    pCO2 arterial 58 (*)    Bicarbonate 16.4 (*)    Acid-base deficit 14.4 (*)    All other components within normal limits  TROPONIN I (HIGH SENSITIVITY) - Abnormal; Notable for the following components:   Troponin I (High Sensitivity) 3,486 (*)    All other components within normal limits  MRSA PCR SCREENING  LIPID PANEL  HEMOGLOBIN A1C  BLOOD GAS, ARTERIAL  BASIC METABOLIC PANEL  CBC  CBC  CREATININE, SERUM  TROPONIN I (HIGH  SENSITIVITY)  TROPONIN I (HIGH SENSITIVITY)    ____________________________________________  EKG: Inferior but also lateral ST elevation MI with profound ST elevations in the inferior and lateral precordial leads.  Ventricular rate 144.  PR 122, QRS 96, QTc 448. ________________________________________  RADIOLOGY All imaging, including plain films, CT scans, and ultrasounds, independently reviewed by me, and interpretations confirmed via formal radiology reads.  ED MD interpretation:   Deferred  Official radiology report(s): CARDIAC CATHETERIZATION  Result Date: 05/12/2020  Prox RCA lesion is 50% stenosed.  RPDA lesion is 100% stenosed.  3rd RPL lesion is 100% stenosed.  Mid LAD lesion is 100% stenosed.  Post intervention, there is a 0% residual stenosis.  A drug-eluting stent was successfully placed using a STENT RESOLUTE ONYX 2.5X34.  Mid LAD to Dist LAD lesion is 30% stenosed.  1.  Severe two-vessel coronary artery disease with an occluded mid LAD which seems to be the culprit for anterolateral ST elevation myocardial infarction.  In addition, there was evidence of thrombus in the proximal right coronary artery with embolization into the distal branches.  No significant plaque was noted in the proximal right coronary artery. 2.  Successful angioplasty and drug-eluting stent placement to the LAD.  Relook angiography of the right coronary artery at the hand showed almost complete resolution of the proximal thrombus with full anticoagulation and Aggrastat. 3.  Right heart catheterization showed moderately elevated filling pressures, moderate pulmonary hypertension and normal cardiac output. Recommendations: The patient's COVID 19 results came back positive which is likely responsible for hypercoagulable state with thrombus formation in the right coronary artery.  The LAD was revascularized.  Revascularization was not needed in the right coronary artery. Continue Aggrastat infusion for 18  hours. Continue dual antiplatelet therapy for at  least 12 months. Aggressive treatment of risk factors. Supportive care for what seems to be likely COVID-19 pneumonia in addition to myocardial infarction.    ____________________________________________  PROCEDURES   Procedure(s) performed (including Critical Care):  .Critical Care Performed by: Duffy Bruce, MD Authorized by: Duffy Bruce, MD   Critical care provider statement:    Critical care time (minutes):  45   Critical care time was exclusive of:  Separately billable procedures and treating other patients and teaching time   Critical care was necessary to treat or prevent imminent or life-threatening deterioration of the following conditions:  Cardiac failure, circulatory failure, respiratory failure and shock   Critical care was time spent personally by me on the following activities:  Development of treatment plan with patient or surrogate, discussions with consultants, evaluation of patient's response to treatment, examination of patient, obtaining history from patient or surrogate, ordering and performing treatments and interventions, ordering and review of laboratory studies, ordering and review of radiographic studies, pulse oximetry, re-evaluation of patient's condition and review of old charts   I assumed direction of critical care for this patient from another provider in my specialty: no   Procedure Name: Intubation Date/Time: 05/06/2020 10:26 PM Performed by: Duffy Bruce, MD Pre-anesthesia Checklist: Patient identified, Patient being monitored, Emergency Drugs available, Timeout performed and Suction available Oxygen Delivery Method: Non-rebreather mask Preoxygenation: Pre-oxygenation with 100% oxygen Induction Type: Rapid sequence Ventilation: Mask ventilation without difficulty Laryngoscope Size: Glidescope and 4 Grade View: Grade I Tube size: 7.5 mm Number of attempts: 1 Airway Equipment and Method:  Video-laryngoscopy Placement Confirmation: ETT inserted through vocal cords under direct vision,  CO2 detector and Breath sounds checked- equal and bilateral Secured at: 21 cm Tube secured with: ETT holder Dental Injury: Teeth and Oropharynx as per pre-operative assessment  Difficulty Due To: Difficulty was unanticipated Future Recommendations: Recommend- induction with short-acting agent, and alternative techniques readily available       ____________________________________________  INITIAL IMPRESSION / MDM / ASSESSMENT AND PLAN / ED COURSE  As part of my medical decision making, I reviewed the following data within the [facility] notes reviewed and incorporated, Old chart reviewed, Notes from prior ED visits, and [place] Controlled Substance Database       *ATIRA BORELLO was evaluated in Emergency Department on 04/23/2020 for the symptoms described in the history of present illness. She was evaluated in the context of the global COVID-19 pandemic, which necessitated consideration that the patient might be at risk for infection with the SARS-CoV-2 virus that causes COVID-19. Institutional protocols and algorithms that pertain to the evaluation of patients at risk for COVID-19 are in a state of rapid change based on information released by regulatory bodies including the CDC and federal and state organizations. These policies and algorithms were followed during the patient's care in the ED.  Some ED evaluations and interventions may be delayed as a result of limited staffing during the pandemic.*     Medical Decision Making: 60 year old female here with acute respiratory failure in the setting of STEMI.  Patient in profound respiratory distress on arrival requiring BiPAP.  EKG confirms inferior but also concerning for lateral ST elevation.  Concern for possible LAD lesion.  Given her severe increased work of breathing and hypoxia, decision made to intubate.  Initially,  this was delayed as patient was profoundly hypotensive with blood pressure in the 37S systolic.  She was given 1 L of fluids and started on peripheral Levophed, as well as  given a dose of epinephrine prior to intubation, with slight improvement in her blood pressure to the 02R systolic.  Patient had symmetric breath sounds and good aeration.  Decision made to take to the Cath Lab.  Fluoroscopy will confirm ET tube placement there per cardiology.  Family updated.  ____________________________________________  FINAL CLINICAL IMPRESSION(S) / ED DIAGNOSES  Final diagnoses:  ST elevation myocardial infarction involving left anterior descending (LAD) coronary artery ([place])  Acute respiratory failure with hypoxia ([place])     MEDICATIONS GIVEN DURING THIS VISIT:  Medications  norepinephrine (LEVOPHED) 34m in 2543mpremix infusion (25 mcg/min Intravenous Rate/Dose Change 04/24/2020 2130)  0.9 %  sodium chloride infusion (has no administration in time range)  aspirin chewable tablet 324 mg ( Oral MAR Unhold 04/29/2020 2212)  etomidate (AMIDATE) injection (15 mg Intravenous Given 04/27/2020 1937)  rocuronium (ZEMURON) injection (100 mg Intravenous Given 04/23/2020 1938)  EPINEPHrine (ADRENALIN) 1 MG/10ML injection (1 mg Intravenous Given 04/20/2020 1943)  propofol (DIPRIVAN) 1000 MG/100ML infusion (10 mcg/kg/min  97 kg Intravenous New Bag/Given 04/27/2020 1949)  acetaminophen (TYLENOL) tablet 650 mg (has no administration in time range)  ondansetron (ZOFRAN) injection 4 mg (has no administration in time range)  heparin injection 5,000 Units (has no administration in time range)  sodium chloride flush (NS) 0.9 % injection 3 mL (has no administration in time range)  sodium chloride flush (NS) 0.9 % injection 3 mL (has no administration in time range)  0.9 %  sodium chloride infusion (has no administration in time range)  atorvastatin (LIPITOR) tablet 80 mg (has no administration in time range)  aspirin chewable tablet  81 mg (has no administration in time range)  ticagrelor (BRILINTA) tablet 90 mg (has no administration in time range)  tirofiban (AGGRASTAT) infusion 50 mcg/mL 250 mL (has no administration in time range)  chlorhexidine gluconate (MEDLINE KIT) (PERIDEX) 0.12 % solution 15 mL (has no administration in time range)  MEDLINE mouth rinse (has no administration in time range)  Chlorhexidine Gluconate Cloth 2 % PADS 6 each (has no administration in time range)  docusate (COLACE) 50 MG/5ML liquid 100 mg (has no administration in time range)  polyethylene glycol (MIRALAX / GLYCOLAX) packet 17 g (has no administration in time range)  famotidine (PEPCID) IVPB 20 mg premix (has no administration in time range)  fentaNYL (SUBLIMAZE) injection 50 mcg (has no administration in time range)  fentaNYL 250025min NS [id number]) infusion-PREMIX (has no administration in time range)  fentaNYL (SUBLIMAZE) bolus via infusion 50-100 mcg (has no administration in time range)  midazolam (VERSED) injection 2 mg (has no administration in time range)  midazolam (VERSED) injection 2 mg (has no administration in time range)  sodium chloride 0.9 % bolus 1,000 mL (1,000 mLs Intravenous New Bag/Given 05/10/2020 1930)  heparin injection 60 Units/kg (4,000 Units Intravenous Given 05/11/2020 1922)  tirofiban (AGGRASTAT) infusion 50 mcg/mL 100 mL (0.15 mcg/kg/min  97 kg Intravenous New Bag/Given 05/10/2020 2045)     ED Discharge Orders         Ordered    AMB Referral to Cardiac Rehabilitation - Phase II        04/19/2020 2140           Note:  This document was prepared using Dragon voice recognition software and may include unintentional dictation errors.   IsaacDuffy Bruce04/25/22 2228

## 2020-05-13 NOTE — Progress Notes (Signed)
Patient arriving from home called in as a STEMI. Patient was on cpap per EMS. Patient was placed on bipap 10/5 R20 100% per Dr Penne Lash while preparing for intubation/cath lab procedure. Patient tolerated bipap/intubation/vent setup well. Patient was intubated with 7.5 et tube 21 at lip. Positive color change noted on end tidal. BBS noted per MD. Placed patient on vent settings VT 450 r20 100% peep 5. Patient urgently taken to cath lab. Patient tolerated RT interventions well.

## 2020-05-13 NOTE — ED Notes (Signed)
BP 40/29, per Dr. Kirke Corin 1mg  epi to be given

## 2020-05-13 NOTE — Consult Note (Signed)
ANTICOAGULATION CONSULT NOTE - Initial Consult  Pharmacy Consult for tirofiban (aggrastat) Indication: STEMI  Allergies  Allergen Reactions  . Amoxicillin Anaphylaxis  . Doxycycline Shortness Of Breath, Swelling and Other (See Comments)    Chest pain  . Morphine And Related Swelling  . Penicillins Swelling    Did it involve swelling of the face/tongue/throat, SOB, or low BP? Unknown Did it involve sudden or severe rash/hives, skin peeling, or any reaction on the inside of your mouth or nose? Unknown Did you need to seek medical attention at a hospital or doctor's office? Unknown When did it last happen?unkn  If all above answers are "NO", may proceed with cephalosporin use.     Patient Measurements: Weight: 97 kg (213 lb 13.5 oz)  Vital Signs: BP: 108/72 (04/25 1956) Pulse Rate: 144 (04/25 1956)  Labs: Recent Labs    04/22/2020 1932  HGB 16.2*  HCT 48.4*  PLT 321  CREATININE 1.54*  TROPONINIHS 3,486*    Estimated Creatinine Clearance: 40.9 mL/min (A) (by C-G formula based on SCr of 1.54 mg/dL (H)).   Medical History: Past Medical History:  Diagnosis Date  . Complication of anesthesia    long time to wake up, headache  . Diabetes mellitus without complication ([facility])   . Lymphedema    in legs/feet  . Neuromuscular disorder ([facility])     Medications:  Heparin 4,000 + 7,000 IV given for STEM/during cath  Assessment: 60 year old presenting with STEMI. S/p emergency cath. Pharmacy has been consulted for tirofiban infusion.   CrCl < 60 mL/min, bolus of 25 mcg/kg administered   Goal of Therapy:  Monitor platelets by anticoagulation protocol: Yes   Plan:   Initiate Aggrastat infusion at reduced rate of 0.15 mg/kg/min (for CrCl < 60 mL/min) x 18 hours  Check platelets with AM labs  CBC daily  Monitor renal function   Sharen Hones, PharmD, BCPS Clinical Pharmacist  05/07/2020,10:10 PM

## 2020-05-13 NOTE — ED Triage Notes (Signed)
Patient BIBA from EMS for STEMI. Patient diaphoretic and on CPAP on arrival to ER.   20 G RAC 20 G L wrist.  324 ASA 500 ml NS

## 2020-05-13 NOTE — ED Notes (Signed)
Dr. Arida at bedside 

## 2020-05-13 NOTE — Consult Note (Signed)
Cardiology Consultation:   Patient ID: Courtney Henderson MRN: [id number]; DOB: 1961/01/08  Admit date: 05/06/2020 Date of Consult: 04/19/2020  PCP:  Cletis Athens, [place]  Cardiologist:  Thurston Hole) Advanced Practice Provider:  No care team member to display Electrophysiologist:  None     Patient Profile:   Courtney Henderson is a 60 y.o. female with a hx of type 2 diabetes, tobacco use and previous back surgery who is being seen today for the evaluation of respiratory failure and ST elevation myocardial infarction at the request of Dr. Ellender Hose.  History of Present Illness:   Courtney Henderson is a 60 year old female with no previous cardiac history.  She has been having dry cough for about 5 to 6 days.  She did not seek medical attention.  Today, she had acute onset of shortness of breath and called EMS.  No chest pain.  The patient was in respiratory distress.  An EKG was done which showed evidence of anterolateral ST elevation as well as inferior ST elevation.  The patient was brought to the ED.  She was not in severe respiratory distress with severe orthopnea.  She was mildly hypotensive and significantly hypoxic.  After discussion with the ED physician, it was felt that the best course of action would be to intubate the patient in order to be able to proceed with cardiac cath procedure.  The patient was agreeable.  She was intubated.  With sedation, she became severely hypotensive and spite of norepinephrine drip.  She was given 1 amp of epinephrine.  She became tachycardic and went into ventricular tachycardia.  She was shocked once to sinus tachycardia.  I updated the patient's daughter and the overall poor prognosis but I felt that the best option would be to proceed with emergent cardiac catheterization and possible PCI. The patient was given aspirin and unfractionated heparin.   Past Medical History:  Diagnosis Date  . Complication of anesthesia    long time  to wake up, headache  . Diabetes mellitus without complication ([place])   . Lymphedema    in legs/feet  . Neuromuscular disorder [facility])     Past Surgical History:  Procedure Laterality Date  . CATARACT EXTRACTION, BILATERAL    . JOINT REPLACEMENT Left    hip  . KYPHOPLASTY N/A 01/05/2019   Procedure: KYPHOPLASTY L5;  Surgeon: Hessie Knows, MD;  Location: [facility] ORS;  Service: Orthopedics;  Laterality: N/A;  . MULTIPLE TOOTH EXTRACTIONS  06/2018   2-3 weeks ago; 10 teeth  . TUBAL LIGATION       Home Medications:  Prior to Admission medications   Medication Sig Start Date End Date Taking? Authorizing Provider  Blood Glucose Monitoring Suppl (CONTOUR NEXT EZ MONITOR) w/Device KIT 1 Device by Does not apply route in the morning and at bedtime.  04/29/16   [provider]  BREO ELLIPTA 100-25 MCG/INH AEPB Inhale 1 puff into the lungs daily. 01/23/20   Cletis Athens, MD  gabapentin (NEURONTIN) 600 MG tablet Take 1 tablet (600 mg total) by mouth 3 (three) times daily. 04/15/20   Cletis Athens, MD  glucose blood (ACCU-CHEK GUIDE) test strip Use as instructed 02/12/20   Cletis Athens, MD  ibuprofen (ADVIL) 200 MG tablet Take 200 mg by mouth every 6 (six) hours as needed.    [provider]  latanoprost (XALATAN) 0.005 % ophthalmic solution Place 1 drop into both eyes at bedtime. 03/18/18   [provider]  metFORMIN (GLUCOPHAGE) 500 MG tablet Take 1 tablet (500 mg total) by mouth 2 (two) times daily. 12/28/19   Cletis Athens, MD  MICROLET LANCETS MISC 1 Stick by Other route in the morning and at bedtime.  04/29/16   [provider]    Inpatient Medications: Scheduled Meds: . aspirin  324 mg Oral Once  . [START ON May 29, 2020] aspirin  81 mg Oral Daily  . [START ON 05-29-20] atorvastatin  80 mg Oral Daily  . chlorhexidine gluconate (MEDLINE KIT)  15 mL Mouth Rinse BID  . [START ON 05-29-20] Chlorhexidine Gluconate Cloth  6 each Topical Daily  . docusate  100 mg Per  Tube BID  . fentaNYL (SUBLIMAZE) injection  50 mcg Intravenous Once  . [START ON 05/29/20] heparin  5,000 Units Subcutaneous Q8H  . [START ON 2020/05/29] mouth rinse  15 mL Mouth Rinse 10 times per day  . [START ON 05-29-20] polyethylene glycol  17 g Per Tube Daily  . sodium chloride flush  3 mL Intravenous Q12H  . [START ON 2020/05/29] ticagrelor  90 mg Oral BID   Continuous Infusions: . sodium chloride    . sodium chloride    . famotidine (PEPCID) IV    . fentaNYL infusion INTRAVENOUS    . norepinephrine (LEVOPHED) Adult infusion 25 mcg/min (04/29/2020 2130)  . propofol (DIPRIVAN) infusion 10 mcg/kg/min (04/20/2020 1949)  . tirofiban     PRN Meds: sodium chloride, acetaminophen, EPINEPHrine, etomidate, fentaNYL, midazolam, midazolam, ondansetron (ZOFRAN) IV, rocuronium, sodium chloride flush  Allergies:    Allergies  Allergen Reactions  . Amoxicillin Anaphylaxis  . Doxycycline Shortness Of Breath, Swelling and Other (See Comments)    Chest pain  . Morphine And Related Swelling  . Penicillins Swelling    Did it involve swelling of the face/tongue/throat, SOB, or low BP? Unknown Did it involve sudden or severe rash/hives, skin peeling, or any reaction on the inside of your mouth or nose? Unknown Did you need to seek medical attention at a hospital or doctor's office? Unknown When did it last happen?unkn  If all above answers are "NO", may proceed with cephalosporin use.     Social History:   Social History   Socioeconomic History  . Marital status: Married    Spouse name: Not on file  . Number of children: Not on file  . Years of education: Not on file  . Highest education level: Not on file  Occupational History  . Not on file  Tobacco Use  . Smoking status: Current Every Day Smoker    Packs/day: 1.50    Years: 40.00    Pack years: 60.00    Types: Cigarettes  . Smokeless tobacco: Never Used  Vaping Use  . Vaping Use: Never used  Substance and Sexual Activity   . Alcohol use: Not Currently    Alcohol/week: 0.0 standard drinks  . Drug use: Never  . Sexual activity: Not on file  Other Topics Concern  . Not on file  Social History Narrative  . Not on file   Social Determinants of Health   Financial Resource Strain: Not on file  Food Insecurity: Not on file  Transportation Needs: Not on file  Physical Activity: Not on file  Stress: Not on file  Social Connections: Not on file  Intimate Partner Violence: Not on file    Family History:    Family History  Problem Relation Age of Onset  . Heart disease Maternal Grandfather      ROS:  Please  see the history of present illness.   All other ROS reviewed and negative.     Physical Exam/Data:   Vitals:   04/21/2020 1952 05/01/2020 1954 04/22/2020 1956 04/23/2020 2225  BP: 104/67 101/72 108/72   Pulse: (!) 144 (!) 142 (!) 144   Resp: 20 20 20    SpO2: 98% 96% 93% 97%  Weight:       No intake or output data in the 24 hours ending 04/25/2020 2242 Last 3 Weights 05/09/2020 04/08/2020 12/18/2019  Weight (lbs) 213 lb 13.5 oz 214 lb 199 lb  Weight (kg) 97 kg 97.07 kg 90.266 kg     Body mass index is 40.41 kg/m.  General:  Well nourished, well developed, in severe respiratory distress. HEENT: normal Lymph: no adenopathy Neck: no JVD Endocrine:  No thryomegaly Vascular: No carotid bruits; FA pulses 2+ bilaterally without bruits  Cardiac: Severely tachycardic with no murmurs. Lungs: Severe respiratory distress with diminished breath sounds and some bibasilar crackles. Abd: soft, nontender, no hepatomegaly  Ext: no edema Musculoskeletal:  No deformities, BUE and BLE strength normal and equal Skin: warm and dry  Neuro:  CNs 2-12 intact, no focal abnormalities noted Psych:  Normal affect   EKG:  The EKG was personally reviewed and demonstrates: Sinus tachycardia with anterolateral ST elevation and inferior ST elevation Telemetry:  Telemetry was personally reviewed and demonstrates: Sinus  tachycardia  Relevant CV Studies: Echocardiogram is pending  Laboratory Data:  High Sensitivity Troponin:   Recent Labs  Lab 04/26/2020 1932  TROPONINIHS 3,486*     Chemistry Recent Labs  Lab 05/15/2020 1932  NA 139  K 3.6  CL 105  CO2 17*  GLUCOSE 193*  BUN 15  CREATININE 1.54*  CALCIUM 8.9  GFRNONAA 38*  ANIONGAP 17*    Recent Labs  Lab 05/04/2020 1932  PROT 6.4*  ALBUMIN 3.4*  AST 48*  ALT 23  ALKPHOS 64  BILITOT 0.9   Hematology Recent Labs  Lab 05/06/2020 1932  WBC 24.0*  RBC 4.83  HGB 16.2*  HCT 48.4*  MCV 100.2*  MCH 33.5  MCHC 33.5  RDW 16.3*  PLT 321   BNPNo results for input(s): BNP, PROBNP in the last 168 hours.  DDimer No results for input(s): DDIMER in the last 168 hours.   Radiology/Studies:  CARDIAC CATHETERIZATION  Result Date: 05/17/2020  Prox RCA lesion is 50% stenosed.  RPDA lesion is 100% stenosed.  3rd RPL lesion is 100% stenosed.  Mid LAD lesion is 100% stenosed.  Post intervention, there is a 0% residual stenosis.  A drug-eluting stent was successfully placed using a STENT RESOLUTE ONYX 2.5X34.  Mid LAD to Dist LAD lesion is 30% stenosed.  1.  Severe two-vessel coronary artery disease with an occluded mid LAD which seems to be the culprit for anterolateral ST elevation myocardial infarction.  In addition, there was evidence of thrombus in the proximal right coronary artery with embolization into the distal branches.  No significant plaque was noted in the proximal right coronary artery. 2.  Successful angioplasty and drug-eluting stent placement to the LAD.  Relook angiography of the right coronary artery at the hand showed almost complete resolution of the proximal thrombus with full anticoagulation and Aggrastat. 3.  Right heart catheterization showed moderately elevated filling pressures, moderate pulmonary hypertension and normal cardiac output. Recommendations: The patient's COVID 19 results came back positive which is likely  responsible for hypercoagulable state with thrombus formation in the right coronary artery.  The LAD was revascularized.  Revascularization was not needed in the right coronary artery. Continue Aggrastat infusion for 18 hours. Continue dual antiplatelet therapy for at least 12 months. Aggressive treatment of risk factors. Supportive care for what seems to be likely COVID-19 pneumonia in addition to myocardial infarction.     Assessment and Plan:   1. Acute anterolateral and inferior ST elevation myocardial infarction: The EKG pattern is unusual and suggestive of two-vessel involvement.  This was confirmed by emergent cardiac catheterization which showed an occluded mid LAD.  This was treated successfully with PCI and drug-eluting stent placement.  In addition, the patient was noted to have a nonocclusive thrombus in the proximal RCA with evidence of distal embolization into the branches.  This is highly suggestive of a hypercoagulable state which could be due to active COVID-19 infection which was confirmed at the end of the case.  Continue Aggrastat infusion for 18 hours.  Continue dual antiplatelet therapy with aspirin and ticagrelor for at least 1 year.  High-dose atorvastatin. 2. Acute systolic heart failure due to post myocardial infarction cardiomyopathy: I requested an echocardiogram.  Wean off norepinephrine drip as tolerated.  Right heart catheterization was done at the end of PCI which showed normal cardiac output and moderately elevated filling pressures.  No mechanical support was needed and definitely not recommended especially with her active COVID-19 infection.  The patient was given 1 dose of IV furosemide 40 mg in the Cath Lab.  Avoid overdiuresis as I suspect that her hypotension is multifactorial and not just cardiogenic. 3. COVID-19 pneumonia: Check chest x-ray.  Discussed with Dr. Mortimer Fries.  Likely will be treated with steroids and remdesivir if family agrees as they seem to have  reservations.  Continue supportive care. 4. The patient had significant hemodynamic instability in the Cath Lab which required 60 minutes of critical care time adjusting vasopressors, ventilator and underlying metabolic abnormalities.   Risk Assessment/Risk Scores:     TIMI Risk Score for ST  Elevation MI:   The patient's TIMI risk score is 10, which indicates a 35.9% risk of all cause mortality at 30 days. {   For questions or updates, please contact [place] Please consult [email or web address] for contact info under    Signed, Kathlyn Sacramento, MD  04/29/2020 10:42 PM

## 2020-05-13 NOTE — Progress Notes (Signed)
Ventilator rate changed from 20 to 26 post ABG results.  Abg results were as follows:  PH: 7.06. PCO2: 58, PO2: 107, HCO3: 16.4, BE: -14.4, SO2: 98.6%.    ABG results and ventilator changes told to Dr. Kirke Corin.

## 2020-05-13 NOTE — Code Documentation (Signed)
XR at bedside

## 2020-05-13 NOTE — ED Notes (Signed)
Pt arrived with black brief and white shirt that were cut off due to emergent need. Pt with black hair clip and gray bracelet to the right arm, removed and given to family by Consulting civil engineer, Erie Noe.

## 2020-05-13 NOTE — ED Notes (Addendum)
Pt taken to cath lab at this time with writer, cardiologist Kirke Corin), tech(Shan) and respiratory(Dee).

## 2020-05-13 NOTE — Progress Notes (Signed)
eLink Physician-Brief Progress Note Patient Name: Courtney Henderson DOB: [birth year] MRN: [id number]   Date of Service  06-08-20  HPI/Events of Note  Patient intubated and mechanically ventilated secondary to acute hypoxemic respiratory failure caused by Covid 19 pneumonia, she also had a STEMI, other diagnosis include cardiogenic shock, CHF and AKI.  Prognosis is guarded.  eICU Interventions  New Patient Evaluation completed.        Migdalia Dk 2020/06/08, 10:54 PM

## 2020-05-14 ENCOUNTER — Inpatient Hospital Stay: Payer: 59

## 2020-05-14 ENCOUNTER — Ambulatory Visit: Payer: 59 | Admitting: Internal Medicine

## 2020-05-14 ENCOUNTER — Inpatient Hospital Stay: Admission: EM | Admit: 2020-05-14 | Payer: 59 | Source: Home / Self Care | Admitting: Cardiovascular Disease

## 2020-05-14 ENCOUNTER — Encounter: Payer: Self-pay | Admitting: Cardiovascular Disease

## 2020-05-14 DIAGNOSIS — N179 Acute kidney failure, unspecified: Secondary | ICD-10-CM

## 2020-05-14 DIAGNOSIS — U071 COVID-19: Secondary | ICD-10-CM

## 2020-05-14 DIAGNOSIS — Z978 Presence of other specified devices: Secondary | ICD-10-CM

## 2020-05-14 DIAGNOSIS — I5021 Acute systolic (congestive) heart failure: Secondary | ICD-10-CM

## 2020-05-14 DIAGNOSIS — R570 Cardiogenic shock: Secondary | ICD-10-CM

## 2020-05-14 DIAGNOSIS — I509 Heart failure, unspecified: Secondary | ICD-10-CM

## 2020-05-14 DIAGNOSIS — J9601 Acute respiratory failure with hypoxia: Secondary | ICD-10-CM

## 2020-05-14 DIAGNOSIS — I2102 ST elevation (STEMI) myocardial infarction involving left anterior descending coronary artery: Secondary | ICD-10-CM

## 2020-05-14 DIAGNOSIS — I5189 Other ill-defined heart diseases: Secondary | ICD-10-CM

## 2020-05-14 DIAGNOSIS — R0902 Hypoxemia: Secondary | ICD-10-CM

## 2020-05-14 DIAGNOSIS — R579 Shock, unspecified: Secondary | ICD-10-CM

## 2020-05-14 DIAGNOSIS — J80 Acute respiratory distress syndrome: Secondary | ICD-10-CM

## 2020-05-14 DIAGNOSIS — F17210 Nicotine dependence, cigarettes, uncomplicated: Secondary | ICD-10-CM

## 2020-05-14 DIAGNOSIS — J95850 Mechanical complication of respirator: Secondary | ICD-10-CM

## 2020-05-14 LAB — BLOOD GAS, ARTERIAL
Acid-base deficit: 17.7 mmol/L — ABNORMAL HIGH (ref 0.0–2.0)
Acid-base deficit: 13.6 mmol/L — ABNORMAL HIGH (ref 0.0–2.0)
Acid-base deficit: 13 mmol/L — ABNORMAL HIGH (ref 0.0–2.0)
Bicarbonate: 17.6 mmol/L — ABNORMAL LOW (ref 20.0–28.0)
Bicarbonate: 18.5 mmol/L — ABNORMAL LOW (ref 20.0–28.0)
Bicarbonate: 18 mmol/L — ABNORMAL LOW (ref 20.0–28.0)
FIO2: 1
FIO2: 1
FIO2: 1
MECHVT: 380 mL
MECHVT: 450 mL
MECHVT: 500 mL
Mechanical Rate: 30
O2 Saturation: 89.7 %
O2 Saturation: 97.2 %
O2 Saturation: 99.6 %
PEEP: 10 cmH2O
PEEP: 10 cmH2O
Patient temperature: 37
Patient temperature: 37
Patient temperature: 37
RATE: 30 resp/min
RATE: 34 resp/min
RATE: 30 resp/min
pCO2 arterial: 92 mmHg (ref 32.0–48.0)
pCO2 arterial: 70 mmHg (ref 32.0–48.0)
pCO2 arterial: 62 mmHg — ABNORMAL HIGH (ref 32.0–48.0)
pH, Arterial: <6.9 — CL (ref 7.350–7.450)
pH, Arterial: 7.03 — CL (ref 7.350–7.450)
pH, Arterial: 7.07 — CL (ref 7.350–7.450)
pO2, Arterial: 96 mmHg (ref 83.0–108.0)
pO2, Arterial: 129 mmHg — ABNORMAL HIGH (ref 83.0–108.0)
pO2, Arterial: 226 mmHg — ABNORMAL HIGH (ref 83.0–108.0)

## 2020-05-14 LAB — BASIC METABOLIC PANEL
Anion gap: 15 (ref 5–15)
BUN: 17 mg/dL (ref 8–23)
CO2: 19 mmol/L — ABNORMAL LOW (ref 22–32)
Calcium: 7.7 mg/dL — ABNORMAL LOW (ref 8.9–10.3)
Chloride: 104 mmol/L (ref 98–111)
Creatinine, Ser: 1.95 mg/dL — ABNORMAL HIGH (ref 0.44–1.00)
GFR, Estimated: 29 mL/min — ABNORMAL LOW (ref >60)
Glucose, Bld: 175 mg/dL — ABNORMAL HIGH (ref 70–99)
Potassium: 4.1 mmol/L (ref 3.5–5.1)
Sodium: 138 mmol/L (ref 135–145)

## 2020-05-14 LAB — HIV ANTIBODY (ROUTINE TESTING W REFLEX): HIV Screen 4th Generation wRfx: NONREACTIVE

## 2020-05-14 LAB — LACTIC ACID, PLASMA
Lactic Acid, Venous: 8.9 mmol/L (ref 0.5–1.9)
Lactic Acid, Venous: 8.9 mmol/L (ref 0.5–1.9)

## 2020-05-14 LAB — LIPID PANEL
Cholesterol: 117 mg/dL (ref 0–200)
HDL: 36 mg/dL — ABNORMAL LOW (ref >40)
LDL Cholesterol: 35 mg/dL (ref 0–99)
Total CHOL/HDL Ratio: 3.3 RATIO
Triglycerides: 231 mg/dL — ABNORMAL HIGH (ref <150)
VLDL: 46 mg/dL — ABNORMAL HIGH (ref 0–40)

## 2020-05-14 LAB — PHOSPHORUS: Phosphorus: 10.2 mg/dL — ABNORMAL HIGH (ref 2.5–4.6)

## 2020-05-14 LAB — GLUCOSE, CAPILLARY
Glucose-Capillary: 186 mg/dL — ABNORMAL HIGH (ref 70–99)
Glucose-Capillary: 124 mg/dL — ABNORMAL HIGH (ref 70–99)
Glucose-Capillary: 86 mg/dL (ref 70–99)

## 2020-05-14 LAB — CBC
HCT: 47 % — ABNORMAL HIGH (ref 36.0–46.0)
Hemoglobin: 15.4 g/dL — ABNORMAL HIGH (ref 12.0–15.0)
MCH: 33.7 pg (ref 26.0–34.0)
MCHC: 32.8 g/dL (ref 30.0–36.0)
MCV: 102.8 fL — ABNORMAL HIGH (ref 80.0–100.0)
Platelets: 428 10*3/uL — ABNORMAL HIGH (ref 150–400)
RBC: 4.57 MIL/uL (ref 3.87–5.11)
RDW: 16.7 % — ABNORMAL HIGH (ref 11.5–15.5)
WBC: 24.2 10*3/uL — ABNORMAL HIGH (ref 4.0–10.5)
nRBC: 2.2 % — ABNORMAL HIGH (ref 0.0–0.2)

## 2020-05-14 LAB — LACTATE DEHYDROGENASE: LDH: 688 U/L — ABNORMAL HIGH (ref 98–192)

## 2020-05-14 LAB — MAGNESIUM: Magnesium: 1.6 mg/dL — ABNORMAL LOW (ref 1.7–2.4)

## 2020-05-14 LAB — PROCALCITONIN: Procalcitonin: 58.57 ng/mL

## 2020-05-14 LAB — HEMOGLOBIN A1C
Hgb A1c MFr Bld: 6 % — ABNORMAL HIGH (ref 4.8–5.6)
Mean Plasma Glucose: 125.5 mg/dL

## 2020-05-14 LAB — D-DIMER, QUANTITATIVE: D-Dimer, Quant: 1.44 ug/mL-FEU — ABNORMAL HIGH (ref 0.00–0.50)

## 2020-05-14 LAB — C-REACTIVE PROTEIN: CRP: 24.7 mg/dL — ABNORMAL HIGH (ref <1.0)

## 2020-05-14 LAB — TRIGLYCERIDES: Triglycerides: 244 mg/dL — ABNORMAL HIGH (ref <150)

## 2020-05-14 LAB — TROPONIN I (HIGH SENSITIVITY): Troponin I (High Sensitivity): >27000 ng/L (ref <18)

## 2020-05-14 LAB — BRAIN NATRIURETIC PEPTIDE: B Natriuretic Peptide: 530.1 pg/mL — ABNORMAL HIGH (ref 0.0–100.0)

## 2020-05-14 LAB — FERRITIN: Ferritin: 198 ng/mL (ref 11–307)

## 2020-05-14 IMAGING — DX DG CHEST 1V PORT
1 series · 2 of 2 positions shown · non-contrast
Comparison: [DATE].

CLINICAL DATA: Hypoxia.

EXAM:
PORTABLE CHEST 1 VIEW

[Series 1: chest ap · 0.14mm/px · 2 of 2 slices shown]
[im 1/2]
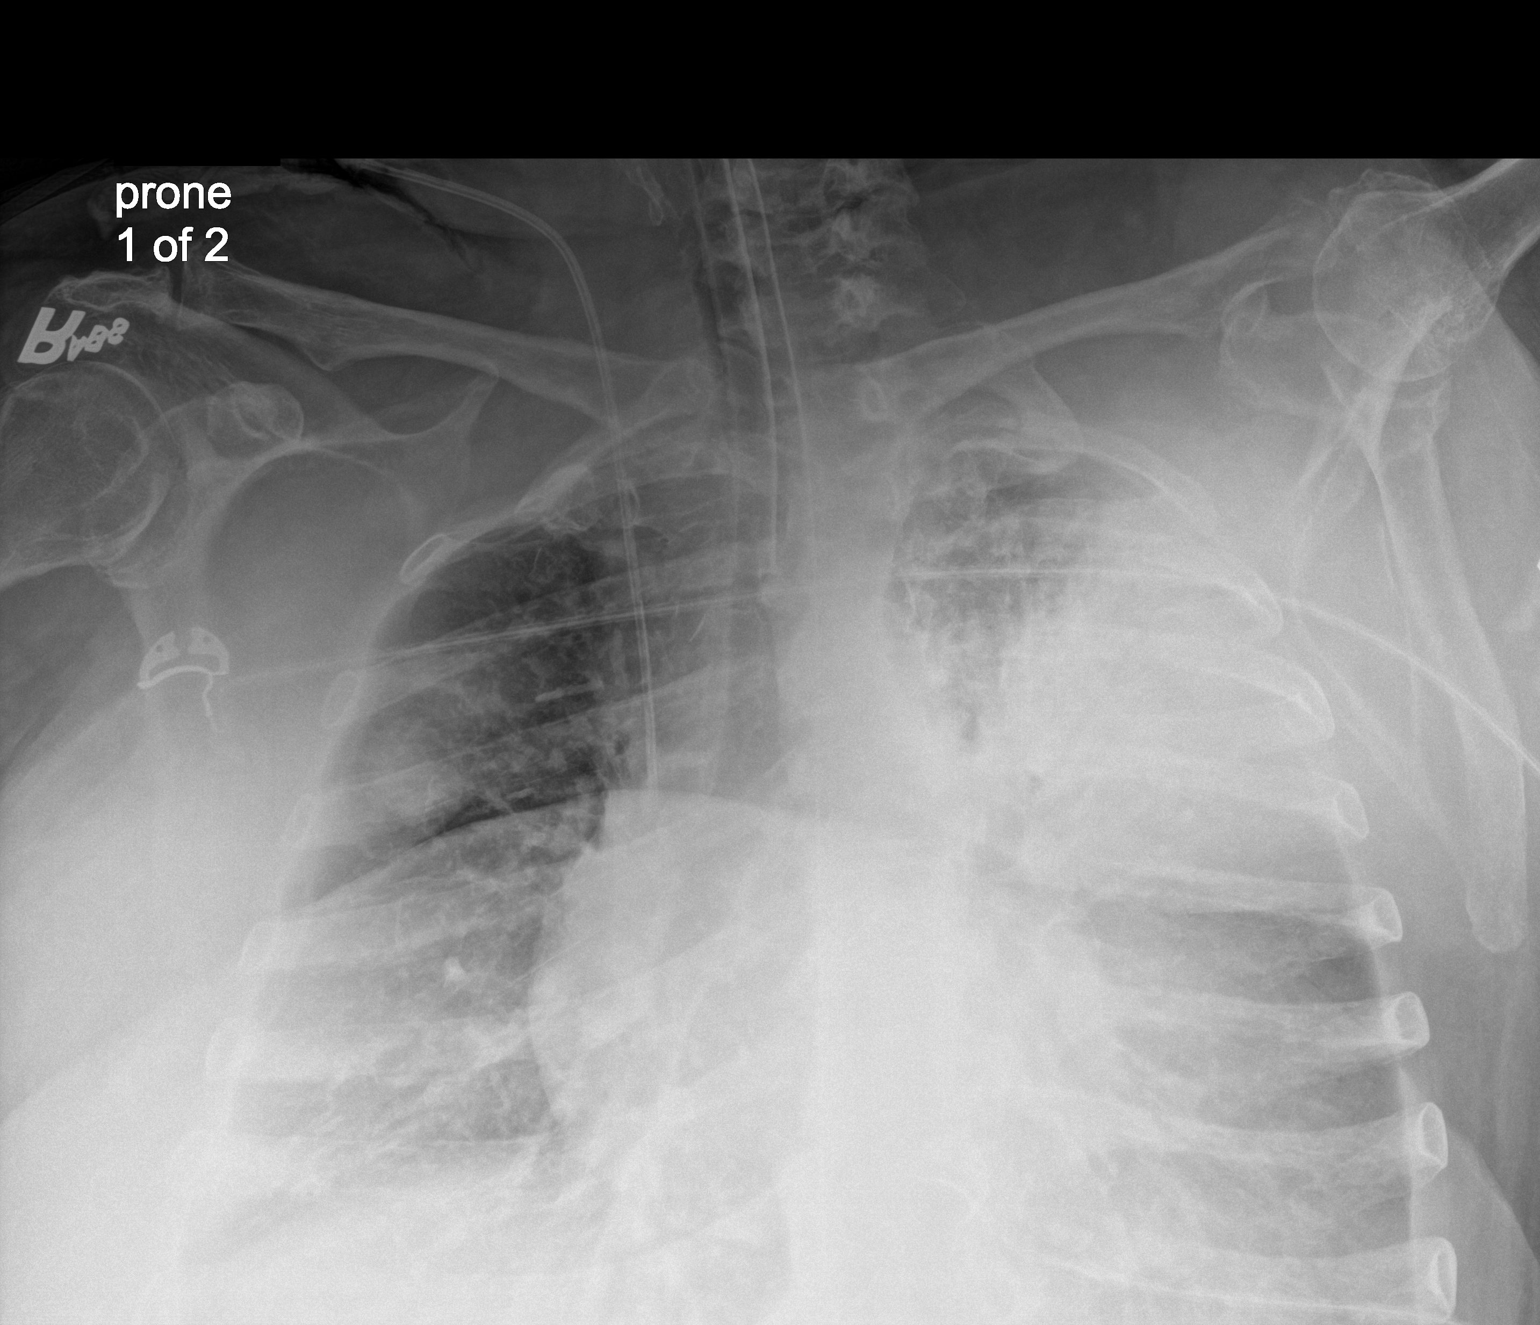
[im 2/2]
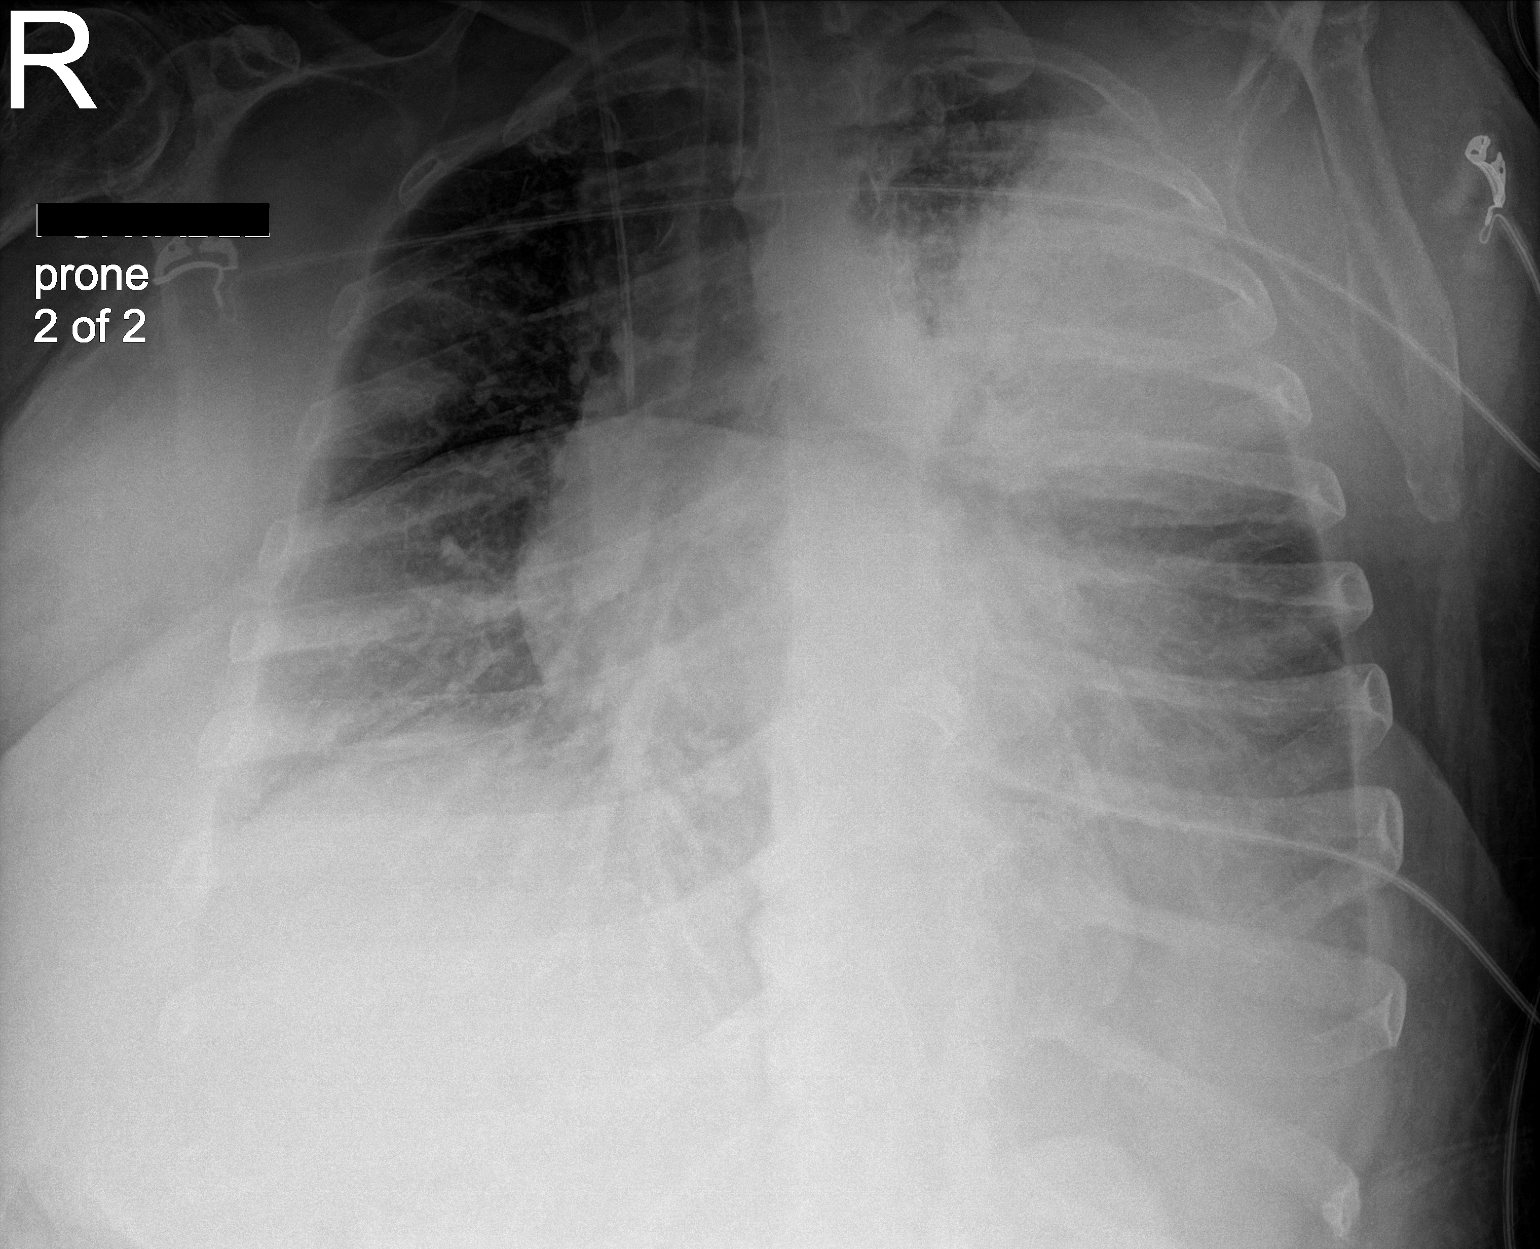

[2 of 2 positions shown; findings below may reference images not displayed]

FINDINGS: Endotracheal tube, NG tube, right IJ line stable position heart size
normal. Multifocal bilateral pulmonary infiltrates/edema noted on
today's exam. Infiltrates/edema particularly prominent in left upper
lung. Probable bilateral pleural effusions. No pneumothorax.
IMPRESSION: 1.  Lines and tubes in stable position.

2. Multifocal bilateral pulmonary infiltrates noted on today's exam.
Infiltrate/edema particularly prominent and the left upper lung.
Probable bilateral pleural effusions.

## 2020-05-14 MED ORDER — ZINC SULFATE 220 (50 ZN) MG PO CAPS
220.0000 mg | ORAL_CAPSULE | Freq: Every day | ORAL | Status: DC
  Administered 2020-05-14: 220 mg
  Filled 2020-05-14: qty 1

## 2020-05-14 MED ORDER — KETOROLAC TROMETHAMINE 30 MG/ML IJ SOLN
30.0000 mg | Freq: Once | INTRAMUSCULAR | Status: AC
  Administered 2020-05-14: 30 mg via INTRAVENOUS

## 2020-05-14 MED ORDER — DIAZEPAM 5 MG/ML IJ SOLN: 5.0000 mg | INTRAMUSCULAR | Status: DC | PRN

## 2020-05-14 MED ORDER — ASCORBIC ACID 500 MG PO TABS
500.0000 mg | ORAL_TABLET | Freq: Every day | ORAL | Status: DC
  Administered 2020-05-14: 500 mg
  Filled 2020-05-14: qty 1

## 2020-05-14 MED ORDER — VANCOMYCIN HCL 1250 MG/250ML IV SOLN: 1250.0000 mg | INTRAVENOUS | Status: DC

## 2020-05-14 MED ORDER — ATORVASTATIN CALCIUM 20 MG PO TABS
80.0000 mg | ORAL_TABLET | Freq: Every day | ORAL | Status: DC
  Administered 2020-05-14: 80 mg
  Filled 2020-05-14: qty 4

## 2020-05-14 MED ORDER — MAGNESIUM SULFATE 4 GM/100ML IV SOLN
4.0000 g | Freq: Once | INTRAVENOUS | Status: AC
  Administered 2020-05-14: 4 g via INTRAVENOUS
  Filled 2020-05-14: qty 100

## 2020-05-14 MED ORDER — NOREPINEPHRINE 4 MG/250ML-% IV SOLN
INTRAVENOUS | Status: AC
  Administered 2020-05-14: 50 mg
  Filled 2020-05-14: qty 250

## 2020-05-14 MED ORDER — SODIUM BICARBONATE 8.4 % IV SOLN
INTRAVENOUS | Status: AC
  Administered 2020-05-14: 100 meq via INTRAVENOUS
  Filled 2020-05-14: qty 50

## 2020-05-14 MED ORDER — VANCOMYCIN HCL 2000 MG/400ML IV SOLN
2000.0000 mg | Freq: Once | INTRAVENOUS | Status: AC
  Administered 2020-05-14: 2000 mg via INTRAVENOUS
  Filled 2020-05-14: qty 400

## 2020-05-14 MED ORDER — PHENYLEPHRINE CONCENTRATED 100MG/250ML (0.4 MG/ML) INFUSION SIMPLE
0.0000 ug/min | INTRAVENOUS | Status: DC
  Administered 2020-05-14 (×3): 400 ug/min via INTRAVENOUS
  Filled 2020-05-14 (×4): qty 250

## 2020-05-14 MED ORDER — KETOROLAC TROMETHAMINE 15 MG/ML IJ SOLN
INTRAMUSCULAR | Status: AC
  Filled 2020-05-14: qty 1

## 2020-05-14 MED ORDER — SODIUM BICARBONATE 8.4 % IV SOLN
100.0000 meq | Freq: Once | INTRAVENOUS | Status: AC
  Administered 2020-05-14: 100 meq via INTRAVENOUS
  Filled 2020-05-14: qty 100

## 2020-05-14 MED ORDER — SODIUM CHLORIDE 0.9 % IV SOLN
1.0000 g | Freq: Two times a day (BID) | INTRAVENOUS | Status: DC
  Administered 2020-05-14: 1 g via INTRAVENOUS
  Filled 2020-05-14 (×2): qty 1

## 2020-05-14 MED ORDER — VECURONIUM BROMIDE 10 MG IV SOLR: 10.0000 mg | Freq: Once | INTRAVENOUS | Status: AC

## 2020-05-14 MED ORDER — CALCIUM GLUCONATE-NACL 1-0.675 GM/50ML-% IV SOLN
1.0000 g | Freq: Once | INTRAVENOUS | Status: AC
  Administered 2020-05-14: 1000 mg via INTRAVENOUS
  Filled 2020-05-14: qty 50

## 2020-05-14 MED ORDER — IPRATROPIUM-ALBUTEROL 0.5-2.5 (3) MG/3ML IN SOLN
3.0000 mL | Freq: Four times a day (QID) | RESPIRATORY_TRACT | Status: DC
  Administered 2020-05-14: 3 mL via RESPIRATORY_TRACT
  Filled 2020-05-14: qty 3

## 2020-05-14 MED ORDER — HYDROMORPHONE HCL 1 MG/ML IJ SOLN
2.0000 mg | INTRAMUSCULAR | Status: DC | PRN
  Administered 2020-05-14: 2 mg via INTRAVENOUS
  Filled 2020-05-14: qty 2

## 2020-05-14 MED ORDER — NOREPINEPHRINE 16 MG/250ML-% IV SOLN
0.0000 ug/min | INTRAVENOUS | Status: DC
  Administered 2020-05-14 (×2): 40 ug/min via INTRAVENOUS
  Administered 2020-05-14: 55 ug/min via INTRAVENOUS
  Filled 2020-05-14 (×4): qty 250

## 2020-05-14 MED ORDER — SODIUM BICARBONATE 8.4 % IV SOLN: 100.0000 meq | INTRAVENOUS | Status: AC

## 2020-05-14 MED ORDER — TICAGRELOR 90 MG PO TABS
90.0000 mg | ORAL_TABLET | Freq: Two times a day (BID) | ORAL | Status: DC
  Administered 2020-05-14: 90 mg
  Filled 2020-05-14: qty 1

## 2020-05-14 MED ORDER — VECURONIUM BROMIDE 10 MG IV SOLR: 10.0000 mg | INTRAVENOUS | Status: DC | PRN

## 2020-05-14 MED ORDER — ASPIRIN 81 MG PO CHEW
81.0000 mg | CHEWABLE_TABLET | Freq: Every day | ORAL | Status: DC
  Administered 2020-05-14: 81 mg
  Filled 2020-05-14: qty 1

## 2020-05-14 MED ORDER — ACETAMINOPHEN 325 MG PO TABS: 650.0000 mg | ORAL_TABLET | ORAL | Status: DC | PRN

## 2020-05-14 MED ORDER — VECURONIUM BROMIDE 10 MG IV SOLR
INTRAVENOUS | Status: AC
  Administered 2020-05-14: 10 mg via INTRAVENOUS
  Filled 2020-05-14: qty 10

## 2020-05-14 MED ORDER — METHYLPREDNISOLONE SODIUM SUCC 125 MG IJ SOLR
97.0000 mg | Freq: Two times a day (BID) | INTRAMUSCULAR | Status: DC
  Administered 2020-05-14: 97 mg via INTRAVENOUS
  Filled 2020-05-14: qty 2

## 2020-05-14 MED ORDER — SODIUM BICARBONATE 8.4 % IV SOLN
INTRAVENOUS | Status: AC
  Filled 2020-05-14: qty 50

## 2020-05-14 MED ORDER — METHYLPREDNISOLONE SODIUM SUCC 40 MG IJ SOLR: 40.0000 mg | Freq: Two times a day (BID) | INTRAMUSCULAR | Status: DC

## 2020-05-14 MED ORDER — INSULIN ASPART 100 UNIT/ML ~~LOC~~ SOLN
0.0000 [IU] | SUBCUTANEOUS | Status: DC
  Administered 2020-05-14: 4 [IU] via SUBCUTANEOUS
  Administered 2020-05-14: 3 [IU] via SUBCUTANEOUS
  Filled 2020-05-14: qty 1

## 2020-05-14 MED ORDER — VASOPRESSIN 20 UNITS/100 ML INFUSION FOR SHOCK
0.0000 [IU]/min | INTRAVENOUS | Status: DC
  Administered 2020-05-14: 0.04 [IU]/min via INTRAVENOUS
  Filled 2020-05-14 (×3): qty 100

## 2020-05-16 LAB — CULTURE, RESPIRATORY W GRAM STAIN

## 2020-05-16 LAB — POCT ACTIVATED CLOTTING TIME
Activated Clotting Time: 261 seconds
Activated Clotting Time: 315 seconds

## 2020-05-16 MED FILL — Medication: Qty: 1 | Status: AC

## 2020-05-19 NOTE — Procedures (Signed)
Central Venous Catheter Insertion Procedure Note  Courtney Henderson  [id number]  [birth year]  Date:Jun 08, 2020  Time:12:28 AM   Provider Performing:Gowri Suchan L [place]   Procedure: Insertion of Non-tunneled Central Venous Catheter(36556) with US guidance (68115)   Indication(s) Medication administration  Consent Unable to obtain consent due to emergent nature of procedure.  Anesthesia Topical only with 1% lidocaine   Timeout Verified patient identification, verified procedure, site/side was marked, verified correct patient position, special equipment/implants available, medications/allergies/relevant history reviewed, required imaging and test results available.  Sterile Technique Maximal sterile technique including full sterile barrier drape, hand hygiene, sterile gown, sterile gloves, mask, hair covering, sterile ultrasound probe cover (if used).  Procedure Description Area of catheter insertion was cleaned with chlorhexidine and draped in sterile fashion.  With real-time ultrasound guidance a central venous catheter was placed into the right internal jugular vein. Nonpulsatile blood flow and easy flushing noted in all ports.  The catheter was sutured in place and sterile dressing applied.  Complications/Tolerance None; patient tolerated the procedure well. Chest X-ray is ordered to verify placement for internal jugular or subclavian cannulation.   Chest x-ray is not ordered for femoral cannulation.  EBL Minimal  Specimen(s) None   Betsey Holiday, AGACNP-BC Acute Care Nurse Practitioner [facility]   [phone] / [phone] Please see Amion for pager details.

## 2020-05-19 NOTE — Progress Notes (Signed)
1400: Patient's husband Trey Paula and daughter Karoline Caldwell confirmed they wanted to withdraw and shift to comfort measures for patient.  Dr. Earlie Server notified. Orders placed for comfort measures and extubation.  1408: Comfort meds administered 1411: Pressors stopped.  Patient moved to supine position and extubated by RT per order.   TOD 1420, confirmed by Vergia Alberts, RN Dr. Earlie Server made aware. eLink notified.

## 2020-05-19 NOTE — Progress Notes (Signed)
Dr. Earlie Server notified that patient's temp continues to increase. Instructed to apply cooling blanket, lavage OG with iced saline, d/c fentanyl gtt and administer 30mg  IV toradol.   All interventions completed as ordered.

## 2020-05-19 NOTE — Plan of Care (Signed)
  Problem: Education: Goal: Knowledge of General Education information will improve Description: Including pain rating scale, medication(s)/side effects and non-pharmacologic comfort measures Reactivated   Problem: Health Behavior/Discharge Planning: Goal: Ability to manage health-related needs will improve Reactivated   Problem: Clinical Measurements: Goal: Ability to maintain clinical measurements within normal limits will improve Reactivated Goal: Will remain free from infection Reactivated Goal: Diagnostic test results will improve Reactivated Goal: Respiratory complications will improve Reactivated Goal: Cardiovascular complication will be avoided Reactivated

## 2020-05-19 NOTE — Progress Notes (Signed)
NAME:  Courtney Henderson, MRN:  [id number], DOB:  05-Jun-1960, LOS: 1 ADMISSION DATE:  05/04/2020,  REFERRING MD: ARIDA CHIEF COMPLAINT:  Acute STEMI  History of Present Illness:   60 year old female with no previous cardiac history.   She has been having dry cough for about 5 to 6 days.   She did not seek medical attention.   4/25  she had acute onset of shortness of breath and called EMS.  No chest pain.   The patient was in respiratory distress.  An EKG was done which showed evidence of anterolateral ST elevation as well as inferior ST elevation.    The patient was brought to the ED.   +severe resp distress +severe orthopnea.  She was mildly hypotensive and significantly hypoxic.   Patient was emergently intubated by ER doc became severely hypotensive and spite of norepinephrine drip.   She became tachycardic and went into ventricular tachycardia.   she was shocked once to sinus tachycardia.   The patient was given aspirin and unfractionated heparin CODE STEMI CALLED AND PATIENT WENT TO CATH LAB   PERTINENT HISTORY 60 yo white female admitted to ICU for acute STEMI and acute COVID  Patient with chest pain all day while at home presented with acute and severe hypoxic respiratory failure  EKG c/w STEMI Patient failed CPAP Patient was emergently intubated and CODE STEMI called +LAD STEMI COVID 19 pneumonia/infection H/o DM +acute renal failure    Significant Hospital Events: Including procedures, antibiotic start and stop dates in addition to other pertinent events   . 4/25 acute STEMI . 4/25 acute cardiac cath 100% LAD with RCA clot . 4/25 severe resp failure/hypoxia intubated on vent, pressors  Assessment & Plan:  60 yo obese white female with acute and severe acute coronary syndrome with acute STEMI with severe hypoxic respiratory failure with cardiac RCA clots with COVID 19 infection/pneumonia with acute renal failure   RESPIRATORY:   Acute hypoxemic respiratory  failure due to COVID-19 pneumonia/ARDS  Mechanical ventilation via ARDS protocol, target PRVC 6 cc/kg Wean PEEP and FiO2 as able Goal plateau pressure less than 30, driving pressure less than 15 Paralytics if necessary for vent synchrony, gas exchange Cycle prone positioning if necessary for oxygenation Deep sedation per PAD protocol VAP prevention order set Aggressive pulm toilet recommended  Follow inflammatory markers as needed with CRP Vitamin C, zinc as indicated Plan to repeat and check resp cultures if needed Continue IV steroids   Assess  proning as tolerated due to severe hypoxia   Maintain airborne and contact precautions  High risk cardiac arrest and  death  Severe ACUTE Hypoxic and Hypercapnic Respiratory Failure -continue Mechanical Ventilator support -continue Bronchodilator Therapy -Wean Fio2 and PEEP as tolerated -VAP/VENT bundle implementation  Repeat CXR Trend PCT, CRP etc Maintain in prone at this time Left pleural effusion, no recent films for comparison    ACUTE SYSTOLIC CARDIAC FAILURE- ECHO pending -oxygen as needed -Lasix as tolerated -follow up cardiac enzymes as indicated -follow up cardiology recs  CARDIOGENIC SHOCK -STEMI post stent to LAD -Troponin sustained >27k -echo pending -On triple vasopressors to keep MAP>65 -follow ABG and LA -Trend BNP   ACUTE KIDNEY INJURY/Renal Failure Lab Results  Component Value Date   CREATININE 1.95 (H) 05/28/2020   BUN 17 05/28/20   NA 138 05/28/2020   K 4.1 28-May-2020   CL 104 May 28, 2020   CO2 19 (L) 05/28/20   -continue Foley Catheter-assess need -Avoid nephrotoxic agents -Follow urine output,  BMP -Ensure adequate renal perfusion, optimize oxygenation -Renal dose medications -follow labs as needed -replace as needed -pharmacy consultation and following  ACUTE ANEMIA- TRANSFUSE AS NEEDED CONSIDER TRANSFUSION  IF HGB<7 DVT PRX with TED/SCD's ONLY  INFECTIOUS DISEASE COVID  positive Remdesivir ordered ? Sepsis Temp 101.7 PCT 58.57 WBC 24.2 Now on Merrem and Vanc  NEUROLOGY ACUTE TOXIC METABOLIC ENCEPHALOPATHY -need for sedation -Goal RASS -2 to -3  Plan for CVL and art line placed  Best practice (right click and "Reselect all SmartList Selections" daily)  Diet:  NPO Pain/Anxiety/Delirium protocol (if indicated): Yes (RASS goal -2) VAP protocol (if indicated): Yes DVT prophylaxis: Systemic AC GI prophylaxis: H2B Glucose control:  SSI Yes Central venous access:  Yes, and it is still needed Arterial line:  Yes, and it is still needed Foley:  N/A Mobility:  bed rest  PT consulted: N/A Code Status:  full code Disposition: ICU    Objective   Blood pressure 91/78, pulse (!) 34, temperature (!) 100.94 F (38.3 C), resp. rate (!) 34, weight 97 kg, SpO2 (!) 89 %.    Vent Mode: PRVC FiO2 (%):  [100 %] 100 % Set Rate:  [20 bmp-34 bmp] 34 bmp Vt Set:  [380 mL-450 mL] 450 mL PEEP:  [5 cmH20-10 cmH20] 10 cmH20 Plateau Pressure:  [33 cmH20-36 cmH20] 36 cmH20   Intake/Output Summary (Last 24 hours) at 04/21/2020 0844 Last data filed at 04/27/2020 0800 Gross per 24 hour  Intake 1851.32 ml  Output 200 ml  Net 1651.32 ml   Filed Weights   Jun 05, 2020 1937  Weight: 97 kg     PHYSICAL EXAMINATION:  Physical Exam Vitals reviewed: Prone position and sedation preclude much of exam.  Constitutional:      General: She is in acute distress.     Appearance: She is obese. She is toxic-appearing.     Interventions: She is intubated.  HENT:     Head: Normocephalic and atraumatic.  Pulmonary:     Effort: She is intubated.     Breath sounds: Decreased breath sounds present.  Abdominal:     Comments: Prone  Neurological:     GCS: GCS eye subscore is 1. GCS motor subscore is 1.     Comments: Heavily sedated    Labs   CBC: Recent Labs  Lab 06/05/2020 1932 06/05/2020 2231 04/23/2020 0302  WBC 24.0* 32.1* 24.2*  NEUTROABS 20.1*  --   --   HGB 16.2*  16.2* 15.4*  HCT 48.4* 47.2* 47.0*  MCV 100.2* 99.4 102.8*  PLT 321 391 428*    Basic Metabolic Panel: Recent Labs  Lab Jun 05, 2020 1932 06-05-20 2231 04/21/2020 0302 05/04/2020 0306  NA 139  --  138  --   K 3.6  --  4.1  --   CL 105  --  104  --   CO2 17*  --  19*  --   GLUCOSE 193*  --  175*  --   BUN 15  --  17  --   CREATININE 1.54* 1.60* 1.95*  --   CALCIUM 8.9  --  7.7*  --   MG  --   --   --  1.6*  PHOS  --   --   --  10.2*   GFR: Estimated Creatinine Clearance: 32.3 mL/min (A) (by C-G formula based on SCr of 1.95 mg/dL (H)). Recent Labs  Lab Jun 05, 2020 1932 2020-06-05 2231 05/03/2020 0302 04/28/2020 0610  PROCALCITON  --   --  58.57  --  WBC 24.0* 32.1* 24.2*  --   LATICACIDVEN  --   --   --  8.9*    Liver Function Tests: Recent Labs  Lab Jun 10, 2020 1932  AST 48*  ALT 23  ALKPHOS 64  BILITOT 0.9  PROT 6.4*  ALBUMIN 3.4*   No results for input(s): LIPASE, AMYLASE in the last 168 hours. No results for input(s): AMMONIA in the last 168 hours.  ABG    Component Value Date/Time   PHART 7.03 (LL) 05/01/2020 0600   PCO2ART 70 (HH) 05/10/2020 0600   PO2ART 129 (H) 05/03/2020 0600   HCO3 18.5 (L) 04/26/2020 0600   ACIDBASEDEF 13.6 (H) 05/18/2020 0600   O2SAT 97.2 04/26/2020 0600     Coagulation Profile: Recent Labs  Lab 2020/06/10 2140  INR 1.3*    Cardiac Enzymes: No results for input(s): CKTOTAL, CKMB, CKMBINDEX, TROPONINI in the last 168 hours.  HbA1C: Hgb A1c MFr Bld  Date/Time Value Ref Range Status  06-10-2020 07:32 PM 6.0 (H) 4.8 - 5.6 % Final    Comment:    (NOTE) Pre diabetes:          5.7%-6.4%  Diabetes:              >6.4%  Glycemic control for   <7.0% adults with diabetes   12/12/2019 11:44 AM 4.9 <5.7 % of total Hgb Final    Comment:    For the purpose of screening for the presence of diabetes: . <5.7%       Consistent with the absence of diabetes 5.7-6.4%    Consistent with increased risk for diabetes             (prediabetes) >  or =6.5%  Consistent with diabetes . This assay result is consistent with a decreased risk of diabetes. . Currently, no consensus exists regarding use of hemoglobin A1c for diagnosis of diabetes in children. . According to American Diabetes Association (ADA) guidelines, hemoglobin A1c <7.0% represents optimal control in non-pregnant diabetic patients. Different metrics may apply to specific patient populations.  Standards of Medical Care in Diabetes(ADA). .     CBG: Recent Labs  Lab 05/07/2020 0301 05/16/2020 0741  GLUCAP 186* 124*     DVT/GI PRX  assessed I Assessed the need for Labs I Assessed the need for Foley I Assessed the need for Central Venous Line Family Discussion when available I Assessed the need for Mobilization I made an Assessment of medications to be adjusted accordingly Safety Risk assessment completed    Critical Care Time devoted to patient care services and discussion with family described in this note and others from today's date is no less than 90 minutes.  Critical care was necessary to treat /prevent imminent and life-threatening deterioration. Overall, patient is critically ill, prognosis is guarded.  Patient with Multiorgan failure and at high risk for cardiac arrest and death.

## 2020-05-19 NOTE — Progress Notes (Signed)
   05/09/2020 1130  Vitals  Temp (!) 103.1 F (39.5 C)    Ice packs placed on patient

## 2020-05-19 NOTE — Progress Notes (Signed)
Nutrition Brief Note  Chart reviewed. Case discussed with MD, RN, and during ICU rounds. Pt is very unstable and with poor prognosis. Plan no further escalation of care at this time. Pt may transition to comfort care if status worsens.  No further nutrition interventions planned at this time.  Please re-consult as needed.   Levada Schilling, RD, LDN, CDCES Registered Dietitian II Certified Diabetes Care and Education Specialist Please refer to [facility] for RD and/or RD on-call/weekend/after hours pager

## 2020-05-19 NOTE — ACP (Advance Care Planning) (Signed)
CODE STATUS  Discussed with the patient's husband and daughter. They understand that she remains in shock on triple pressors. The patient is mottled and hemodynamically unstable in the prone position. The patient has voiced to her husband many time in the past that she never wanted to be resuscitated. At this time the family agrees that coding her would not improve her outcome. They wish to maintain her care without code blue and so she is now DNR. They will discuss whether or not they want for her to be made comfort care after discussing the situation with other family and friends.  //Courtney Henderson

## 2020-05-19 NOTE — Progress Notes (Signed)
  Chaplain On-Call responded to page from [employer] who reported patient in grave condition and family is in the small ICU waiting room.  Chaplain met patient's husband Merry Proud, daughter Kathie Dike, and four friends there.  Provided much listening and spiritual support as they described the patient's heart attack of yesterday and her non-responsive condition. They are outwardly expressing their anticipatory grief.  Chaplain provided prayer and emotional support.  Chaplain Pollyann Samples M.Div., [facility]

## 2020-05-19 NOTE — Progress Notes (Signed)
GOALS OF CARE  Discussed with the patient's husband and daughter. They understand that she remains in shock on triple pressors. The patient is mottled and hemodynamically unstable in the prone position. The patient has voiced to her husband many time in the past that she never wanted to be resuscitated. At this time the family agrees that coding her would not improve her outcome. They wish to maintain her care without code blue and so she is now DNR. They will discuss whether or not they want for her to be made comfort care after discussing the situation with other family and friends.  //Jakyren Fluegge

## 2020-05-19 NOTE — Discharge Summary (Addendum)
Physician Discharge Summary  Patient ID: Courtney Henderson MRN: [id number] DOB/AGE: 1960/07/10 60 y.o.  Admit date: 04/23/2020 Discharge date: 2020/05/20  TOD: 14:20  Admission Diagnoses: STEMI Cardiogenic Shock COVID-19 Pneumonia COVID-19 Hypercoagulable state  HPI 60 year old female with no previous cardiac history.  She has been having dry cough for about 5 to 6 days.  She did not seek medical attention.  4/25  she had acute onset of shortness of breath and called EMS. No chest pain.  The patient was in respiratory distress. An EKG was done which showed evidence of anterolateral ST elevation as well as inferior ST elevation.   The patient was brought to the ED.  +severe resp distress +severe orthopnea. She was mildly hypotensive and significantly hypoxic.  Patient was emergently intubated by ER doc became severely hypotensive and spite of norepinephrine drip.  She became tachycardic and went into ventricular tachycardia.  she was shocked once to sinus tachycardia.  The patient was given aspirin and unfractionated heparin CODE STEMI CALLED AND PATIENT WENT TO CATH LAB   Discharge Diagnoses:  Active Problems:   Type 2 diabetes mellitus without complication (HCC)   Acute ST elevation myocardial infarction (STEMI) of anterolateral wall (HCC)   Acute respiratory failure with hypoxia (HCC)   Acute hypoxemic respiratory failure due to severe acute respiratory syndrome coronavirus 2 (SARS-CoV-2) disease (HCC)   AKI (acute kidney injury) (HCC)   Acute heart failure (HCC)   Encounter for mechanical complication of ventilator (HCC)   Cardiogenic shock (HCC)   Cigarette smoker   ST elevation myocardial infarction involving left anterior descending (LAD) coronary artery (HCC)   Endotracheal tube present   Hypoxia   Discharged Condition: deceased  Hospital Course:  The patient remained unstable post cath for STEMI She was intubated and mechanically ventilated  requiring prone position for oxygenation She remained in shock post cath and required aggressive triple pressor titration  Despite all these efforts shock was progressive and lactic acid remained over 8 Family attened the bedside and wished that she be made DNR and eventually comfort care  Consults: cardiology  Significant Diagnostic Studies: angiography: LHC  Treatments: procedures: LHC, CVL, AL, ETT  Disposition: Deceased, family will provide details    Signed: Elyn Aquas 05-20-20, 3:21 PM

## 2020-05-19 NOTE — Consult Note (Signed)
ANTICOAGULATION CONSULT NOTE - Initial Consult  Pharmacy Consult for tirofiban (aggrastat) Indication: STEMI  Allergies  Allergen Reactions  . Amoxicillin Anaphylaxis  . Doxycycline Shortness Of Breath, Swelling and Other (See Comments)    Chest pain  . Morphine And Related Swelling  . Penicillins Swelling    Did it involve swelling of the face/tongue/throat, SOB, or low BP? Unknown Did it involve sudden or severe rash/hives, skin peeling, or any reaction on the inside of your mouth or nose? Unknown Did you need to seek medical attention at a hospital or doctor's office? Unknown When did it last happen?unkn  If all above answers are "NO", may proceed with cephalosporin use.     Patient Measurements: Weight: 97 kg (213 lb 13.5 oz)  Vital Signs: BP: 108/72 (04/25 1956) Pulse Rate: 144 (04/25 1956)  Labs: Recent Labs    05-30-20 1932 May 30, 2020 2140 05/30/20 2231 04/23/2020 0302  HGB 16.2*  --  16.2* 15.4*  HCT 48.4*  --  47.2* 47.0*  PLT 321  --  391 428*  APTT  --  63*  --   --   LABPROT  --  16.2*  --   --   INR  --  1.3*  --   --   CREATININE 1.54*  --  1.60* 1.95*  TROPONINIHS 3,486*  --  >27,000* >27,000*    Estimated Creatinine Clearance: 32.3 mL/min (A) (by C-G formula based on SCr of 1.95 mg/dL (H)).   Medical History: Past Medical History:  Diagnosis Date  . Complication of anesthesia    long time to wake up, headache  . Diabetes mellitus without complication (HCC)   . Lymphedema    in legs/feet  . Neuromuscular disorder (HCC)     Medications:  Heparin 4,000 + 7,000 IV given for STEM/during cath  Assessment: 60 year old presenting with STEMI. S/p emergency cath. Pharmacy has been consulted for tirofiban infusion.   CrCl < 60 mL/min, bolus of 25 mcg/kg administered   Goal of Therapy:  Monitor platelets by anticoagulation protocol: Yes   Plan:   Initiate Aggrastat infusion at reduced rate of 0.15 mg/kg/min (for CrCl < 60 mL/min) x 18  hours  Check platelets with AM labs  CBC daily  Monitor renal function   4/26:  Hgb = 15.4, Hct = 47.0,  Platelets = 428  Kaysie Michelini D, PharmD Clinical Pharmacist  05/18/2020,6:08 AM

## 2020-05-19 NOTE — Progress Notes (Signed)
Progress Note  Patient Name: Courtney Henderson Date of Encounter: 06-12-2020  [facility] Cardiologist: Thurston Hole  Subjective   Patient is intubated and sedated.  She has severe hypoxia and is currently proned.  She also remains hypotensive, now on 3 pressors  Inpatient Medications    Scheduled Meds: . vitamin C  500 mg Per Tube Daily  . aspirin  324 mg Oral Once  . aspirin  81 mg Per Tube Daily  . atorvastatin  80 mg Per Tube Daily  . chlorhexidine gluconate (MEDLINE KIT)  15 mL Mouth Rinse BID  . Chlorhexidine Gluconate Cloth  6 each Topical Daily  . docusate  100 mg Per Tube BID  . fentaNYL (SUBLIMAZE) injection  50 mcg Intravenous Once  . heparin  5,000 Units Subcutaneous Q8H  . insulin aspart  0-20 Units Subcutaneous Q4H  . ipratropium-albuterol  3 mL Nebulization Q6H  . mouth rinse  15 mL Mouth Rinse 10 times per day  . methylPREDNISolone (SOLU-MEDROL) injection  97 mg Intravenous Q12H   Followed by  . [START ON 05/17/2020] methylPREDNISolone (SOLU-MEDROL) injection  40 mg Intravenous Q12H  . polyethylene glycol  17 g Per Tube Daily  . sodium chloride flush  3 mL Intravenous Q12H  . ticagrelor  90 mg Per Tube BID  . zinc sulfate  220 mg Per Tube Daily   Continuous Infusions: . sodium chloride    . sodium chloride    . famotidine (PEPCID) IV    . fentaNYL infusion INTRAVENOUS 50 mcg/hr (06-12-2020 0800)  . meropenem (MERREM) IV Stopped (06-12-2020 0555)  . norepinephrine (LEVOPHED) Adult infusion 40 mcg/min (2020-06-12 0800)  . phenylephrine (NEO-SYNEPHRINE) Adult infusion 400 mcg/min (2020-06-12 0800)  . propofol (DIPRIVAN) infusion 10 mcg/kg/min (05/02/2020 1949)  . remdesivir 100 mg in NS 100 mL    . tirofiban 0.075 mcg/kg/min (06/12/20 0800)  . [START ON 05/16/2020] vancomycin    . vasopressin 0.04 Units/min (Jun 12, 2020 0800)   PRN Meds: sodium chloride, acetaminophen, fentaNYL, midazolam, midazolam, ondansetron (ZOFRAN) IV, sodium chloride flush, vecuronium   Vital  Signs    Vitals:   Jun 12, 2020 0500 12-Jun-2020 0600 06/12/20 0700 06/12/2020 0800  BP: (!) 91/20 (!) 95/43 (!) 64/46 91/78  Pulse:   (!) 34 (!) 34  Resp: (!) 34 (!) 34 (!) 34 (!) 34  Temp: 99 F (37.2 C) 99.5 F (37.5 C) 100.22 F (37.9 C) (!) 100.94 F (38.3 C)  SpO2:   (!) 87% (!) 89%  Weight:        Intake/Output Summary (Last 24 hours) at 06/12/2020 0820 Last data filed at 2020/06/12 0800 Gross per 24 hour  Intake 1851.32 ml  Output 200 ml  Net 1651.32 ml   Last 3 Weights 05/08/2020 04/08/2020 12/18/2019  Weight (lbs) 213 lb 13.5 oz 214 lb 199 lb  Weight (kg) 97 kg 97.07 kg 90.266 kg      Telemetry    Sinus tachycardia and 2 episodes of nonsustained ventricular tachycardia of up to 8 beats- Personally Reviewed  ECG    Tracing performed yesterday at 22:50 demonstrating sinus tachycardia with anterolateral and inferior ST segment elevation.- Personally Reviewed  Physical Exam   GEN:  Proned and unresponsive on ventilator Neck:  Unable to assess due to positioning Cardiac:  Heart sounds very distant with auscultation along the left chest.  Unable to listen anteriorly due to prone positioning. Respiratory:  Diminished breath sounds throughout. GI:  Unable to assess due to prone positioning MS:  No significant  edema.  Ecchymoses and mottling noted on upper and lower extremities. Neuro:   Unresponsive Psych: Unresponsive  Labs    High Sensitivity Troponin:   Recent Labs  Lab 05/01/2020 1932 05/12/2020 2231 2020/05/15 0302  TROPONINIHS 3,486* >27,000* >27,000*      Chemistry Recent Labs  Lab 04/21/2020 1932 05/12/2020 2231 2020-05-15 0302  NA 139  --  138  K 3.6  --  4.1  CL 105  --  104  CO2 17*  --  19*  GLUCOSE 193*  --  175*  BUN 15  --  17  CREATININE 1.54* 1.60* 1.95*  CALCIUM 8.9  --  7.7*  PROT 6.4*  --   --   ALBUMIN 3.4*  --   --   AST 48*  --   --   ALT 23  --   --   ALKPHOS 64  --   --   BILITOT 0.9  --   --   GFRNONAA 38* 36* 29*  ANIONGAP 17*  --  15      Hematology Recent Labs  Lab 05/01/2020 1932 05/01/2020 2231 2020/05/15 0302  WBC 24.0* 32.1* 24.2*  RBC 4.83 4.75 4.57  HGB 16.2* 16.2* 15.4*  HCT 48.4* 47.2* 47.0*  MCV 100.2* 99.4 102.8*  MCH 33.5 34.1* 33.7  MCHC 33.5 34.3 32.8  RDW 16.3* 16.5* 16.7*  PLT 321 391 428*    BNPNo results for input(s): BNP, PROBNP in the last 168 hours.   DDimer  Recent Labs  Lab 05/15/20 0610  DDIMER 1.44*     Radiology    DG Abdomen 1 View  Result Date: 2020-05-15 CLINICAL DATA:  Orogastric tube placement EXAM: ABDOMEN - 1 VIEW COMPARISON:  None. FINDINGS: Orogastric tube tip is noted within the proximal body of the stomach. The abdominal gas pattern is indeterminate due to a paucity of intra-abdominal gas. Right flank and pelvis are excluded from view. Moderate left pleural effusion noted. IMPRESSION: Orogastric tube tip within the proximal body of the stomach. Moderate left pleural effusion. Electronically Signed   By: Fidela Salisbury MD   On: 05/15/2020 00:13   CARDIAC CATHETERIZATION  Result Date: 04/26/2020  Prox RCA lesion is 50% stenosed.  RPDA lesion is 100% stenosed.  3rd RPL lesion is 100% stenosed.  Mid LAD lesion is 100% stenosed.  Post intervention, there is a 0% residual stenosis.  A drug-eluting stent was successfully placed using a STENT RESOLUTE ONYX 2.5X34.  Mid LAD to Dist LAD lesion is 30% stenosed.  1.  Severe two-vessel coronary artery disease with an occluded mid LAD which seems to be the culprit for anterolateral ST elevation myocardial infarction.  In addition, there was evidence of thrombus in the proximal right coronary artery with embolization into the distal branches.  No significant plaque was noted in the proximal right coronary artery. 2.  Successful angioplasty and drug-eluting stent placement to the LAD.  Relook angiography of the right coronary artery at the hand showed almost complete resolution of the proximal thrombus with full anticoagulation and  Aggrastat. 3.  Right heart catheterization showed moderately elevated filling pressures, moderate pulmonary hypertension and normal cardiac output. Recommendations: The patient's COVID 19 results came back positive which is likely responsible for hypercoagulable state with thrombus formation in the right coronary artery.  The LAD was revascularized.  Revascularization was not needed in the right coronary artery. Continue Aggrastat infusion for 18 hours. Continue dual antiplatelet therapy for at least 12 months. Aggressive treatment of risk factors.  Supportive care for what seems to be likely COVID-19 pneumonia in addition to myocardial infarction.   DG Chest Port 1 View  Result Date: 05/19/20 CLINICAL DATA:  STEMI, respiratory failure EXAM: PORTABLE CHEST 1 VIEW COMPARISON:  03/20/2014 FINDINGS: None endotracheal tube seen 5.3 cm above the carina. Nasogastric tube extends into the upper abdomen, beyond the margin of the examination. Right internal jugular central venous catheter tip noted within the superior vena cava. There is diffuse opacification of the left hemithorax which may relate to diffuse airspace infiltrate within the left lung or, more likely, posteriorly layering left pleural fluid. The right lung is clear. No pneumothorax. No pleural effusion on the right. Cardiac size is within normal limits. The pulmonary vasculature is unremarkable. IMPRESSION: Support lines and tubes in expected position.  No pneumothorax. Diffuse opacification of the left hemithorax likely related to posteriorly layering left pleural fluid. Less likely related to diffuse left lung airspace infiltrate. Electronically Signed   By: Fidela Salisbury MD   On: 05/19/20 00:12    Cardiac Studies   R/LHC and PCI (05/10/2020): 1.  Severe two-vessel coronary artery disease with an occluded mid LAD which seems to be the culprit for anterolateral ST elevation myocardial infarction.  In addition, there was evidence of thrombus in  the proximal right coronary artery with embolization into the distal branches.  No significant plaque was noted in the proximal right coronary artery. 2.  Successful angioplasty and drug-eluting stent placement to the LAD.  Relook angiography of the right coronary artery at the hand showed almost complete resolution of the proximal thrombus with full anticoagulation and Aggrastat. 3.  Right heart catheterization showed moderately elevated filling pressures, moderate pulmonary hypertension and normal cardiac output.   Patient Profile     60 y.o. female with history of type 2 diabetes mellitus, tobacco use, previous back surgery, admitted with anterolateral and inferior STEMI complicated by [id number] pneumonia with acute respiratory failure with hypoxia.  Assessment & Plan    STEMI: Patient had multiple severe lesions including occlusion of the mid LAD that was treated with DES.  There was also thrombus in the proximal RCA accompanied by severe disease involving the ostial RPDA.  Proximal RCA thrombus lysed with anticoagulation and antiplatelet therapy during the procedure.  She remains hemodynamically unstable, which is likely multifactorial including her COVID-19 pneumonia leading to septic shock, marked metabolic derangements, and component of acute MI.  Of note, right heart catheterization yesterday showed normal cardiac output.  Post PCI EKG showed continued inferior ST elevation; I wonder if some of this may be reflective of RPDA disease.  Complete 18 hours of IV tirofiban infusion.  Dual antiplatelet therapy with aspirin and ticagrelor for at least 12 months.  Unable to add beta-blocker in the setting of shock.  Continue atorvastatin.  Obtain echo when possible (patient currently prone).  If patient has meaningful recovery with stabilization of her hemodynamics and respiratory failure, relook catheterization to assess and potentially treat the RPDA should be considered.  Patient's  prognosis is poor.  COVID-19 pneumonia, shock, and acute respiratory failure with hypoxia: Patient is critically ill requiring high ventilator settings, prone positioning, and multiple vasoactive agents.  Despite this, she remains borderline hypoxic and hypotensive.  Per CCM.  For questions or updates, please contact [place] Please consult [email or web address] for contact info under [facility].     Signed, Nelva Bush, MD  05-19-2020, 8:20 AM

## 2020-05-19 NOTE — Progress Notes (Signed)
Pharmacy Antibiotic Note  Courtney Henderson is a 60 y.o. female admitted on 05/05/2020 with pneumonia.  Pharmacy has been consulted for Meropenem, Vancomycin dosing. CrCl = 32.3 ml/min  Plan:  Meropenem 1 gm IV Q12H ordered to start on 4/26 @ 0600.  Vancomycin 2 gm IV X 1 ordered for 4/26 @ ~ 0600.  Vancomycin 1250 mg IV Q48H ordered to continue on 4/28 @ 0600.   AUC = 551.3 Vanc trough = 12.9 mcg/mL   Weight: 97 kg (213 lb 13.5 oz)  No data recorded.  Recent Labs  Lab 05/17/2020 1932 04/20/2020 2231 Jun 08, 2020 0302  WBC 24.0* 32.1* 24.2*  CREATININE 1.54* 1.60* 1.95*    Estimated Creatinine Clearance: 32.3 mL/min (A) (by C-G formula based on SCr of 1.95 mg/dL (H)).    Allergies  Allergen Reactions  . Amoxicillin Anaphylaxis  . Doxycycline Shortness Of Breath, Swelling and Other (See Comments)    Chest pain  . Morphine And Related Swelling  . Penicillins Swelling    Did it involve swelling of the face/tongue/throat, SOB, or low BP? Unknown Did it involve sudden or severe rash/hives, skin peeling, or any reaction on the inside of your mouth or nose? Unknown Did you need to seek medical attention at a hospital or doctor's office? Unknown When did it last happen?unkn  If all above answers are "NO", may proceed with cephalosporin use.     Antimicrobials this admission:   >>    >>   Dose adjustments this admission:   Microbiology results:  BCx:   UCx:    Sputum:    MRSA PCR:   Thank you for allowing pharmacy to be a part of this patient's care.  Jaziah Goeller D Jun 08, 2020 5:01 AM

## 2020-05-19 NOTE — Procedures (Signed)
Arterial Catheter Insertion Procedure Note  CHRISHAWNA FARINA  [id number]  [birth year]  Date:05-16-2020  Time:12:28 AM    Provider Performing: Cecelia Byars [place]    Procedure: Insertion of Arterial Line (83662) with US guidance ([id number])   Indication(s) Blood pressure monitoring and/or need for frequent ABGs  Consent Unable to obtain consent due to emergent nature of procedure.  Anesthesia None   Time Out Verified patient identification, verified procedure, site/side was marked, verified correct patient position, special equipment/implants available, medications/allergies/relevant history reviewed, required imaging and test results available.   Sterile Technique Maximal sterile technique including full sterile barrier drape, hand hygiene, sterile gown, sterile gloves, mask, hair covering, sterile ultrasound probe cover (if used).   Procedure Description Area of catheter insertion was cleaned with chlorhexidine and draped in sterile fashion. With real-time ultrasound guidance an arterial catheter was placed into the left radial artery.  Appropriate arterial tracings confirmed on monitor.     Complications/Tolerance None; patient tolerated the procedure well.   EBL Minimal   Specimen(s) None   Betsey Holiday, AGACNP-BC Acute Care Nurse Practitioner [facility]   [phone] / [phone] Please see Amion for pager details.

## 2020-05-19 DEATH — deceased

## 2020-05-28 LAB — BLOOD GAS, ARTERIAL
Acid-base deficit: 13.3 mmol/L — ABNORMAL HIGH (ref 0.0–2.0)
Bicarbonate: 18.1 mmol/L — ABNORMAL LOW (ref 20.0–28.0)
Delivery systems: 1
MECHVT: 450 mL
O2 Saturation: 93.3 %
PEEP: 5 cmH2O
Patient temperature: 37
RATE: 26 resp/min
pCO2 arterial: 64 mmHg — ABNORMAL HIGH (ref 32.0–48.0)
pH, Arterial: 7.06 — CL (ref 7.350–7.450)
pO2, Arterial: 95 mmHg (ref 83.0–108.0)

## 2020-06-10 ENCOUNTER — Encounter: Payer: 59 | Admitting: Podiatry

## 2020-07-09 ENCOUNTER — Ambulatory Visit: Payer: 59 | Admitting: Internal Medicine
